# Patient Record
Sex: Female | Born: 1955
Health system: Southern US, Community
[De-identification: ages and names within clinical notes are randomized; demographics above are authoritative.]

## PROBLEM LIST (undated history)

## (undated) DIAGNOSIS — Z972 Presence of dental prosthetic device (complete) (partial): Secondary | ICD-10-CM

## (undated) DIAGNOSIS — Q760 Spina bifida occulta: Secondary | ICD-10-CM

## (undated) DIAGNOSIS — I639 Cerebral infarction, unspecified: Secondary | ICD-10-CM

## (undated) DIAGNOSIS — J45909 Unspecified asthma, uncomplicated: Secondary | ICD-10-CM

## (undated) DIAGNOSIS — I10 Essential (primary) hypertension: Secondary | ICD-10-CM

## (undated) DIAGNOSIS — E785 Hyperlipidemia, unspecified: Secondary | ICD-10-CM

## (undated) DIAGNOSIS — T7840XA Allergy, unspecified, initial encounter: Secondary | ICD-10-CM

## (undated) HISTORY — DX: Essential (primary) hypertension: I10

## (undated) HISTORY — DX: Allergy, unspecified, initial encounter: T78.40XA

## (undated) HISTORY — DX: Cerebral infarction, unspecified: I63.9

## (undated) HISTORY — DX: Unspecified asthma, uncomplicated: J45.909

## (undated) HISTORY — DX: Hyperlipidemia, unspecified: E78.5

---

## 1990-09-01 HISTORY — PX: TOTAL ABDOMINAL HYSTERECTOMY: SHX209

## 1999-09-02 HISTORY — PX: CORONARY ANGIOPLASTY WITH STENT PLACEMENT: SHX49

## 2004-09-01 HISTORY — PX: COLONOSCOPY: SHX5424

## 2004-09-01 HISTORY — PX: CARDIAC ELECTROPHYSIOLOGY STUDY AND ABLATION: SHX1294

## 2009-09-27 ENCOUNTER — Ambulatory Visit: Payer: Self-pay | Admitting: Family Medicine

## 2009-12-21 ENCOUNTER — Ambulatory Visit: Payer: Self-pay | Admitting: Family Medicine

## 2009-12-21 ENCOUNTER — Emergency Department: Payer: Self-pay | Admitting: Internal Medicine

## 2009-12-31 ENCOUNTER — Ambulatory Visit: Payer: Self-pay | Admitting: Family Medicine

## 2010-02-08 ENCOUNTER — Ambulatory Visit: Payer: Self-pay | Admitting: Family Medicine

## 2011-06-08 ENCOUNTER — Emergency Department: Payer: Self-pay | Admitting: Emergency Medicine

## 2011-06-12 DIAGNOSIS — E782 Mixed hyperlipidemia: Secondary | ICD-10-CM | POA: Insufficient documentation

## 2011-06-12 DIAGNOSIS — K219 Gastro-esophageal reflux disease without esophagitis: Secondary | ICD-10-CM | POA: Insufficient documentation

## 2011-09-28 ENCOUNTER — Ambulatory Visit: Payer: Self-pay | Admitting: Internal Medicine

## 2011-09-28 LAB — URINALYSIS, COMPLETE
Bilirubin,UR: NEGATIVE
Glucose,UR: NEGATIVE mg/dL (ref 0–75)
Ketone: NEGATIVE
Leukocyte Esterase: NEGATIVE
Nitrite: NEGATIVE
Ph: 6.5 (ref 4.5–8.0)
Protein: NEGATIVE
Specific Gravity: 1.015 (ref 1.003–1.030)

## 2011-09-29 LAB — URINE CULTURE

## 2015-03-18 DIAGNOSIS — G4733 Obstructive sleep apnea (adult) (pediatric): Secondary | ICD-10-CM | POA: Insufficient documentation

## 2015-05-24 DIAGNOSIS — I2510 Atherosclerotic heart disease of native coronary artery without angina pectoris: Secondary | ICD-10-CM | POA: Insufficient documentation

## 2015-08-13 LAB — HM MAMMOGRAPHY

## 2015-09-02 HISTORY — PX: ESOPHAGOGASTRODUODENOSCOPY: SHX1529

## 2015-10-25 DIAGNOSIS — Z8619 Personal history of other infectious and parasitic diseases: Secondary | ICD-10-CM | POA: Insufficient documentation

## 2015-10-25 DIAGNOSIS — F17201 Nicotine dependence, unspecified, in remission: Secondary | ICD-10-CM | POA: Insufficient documentation

## 2015-10-25 DIAGNOSIS — F17200 Nicotine dependence, unspecified, uncomplicated: Secondary | ICD-10-CM | POA: Insufficient documentation

## 2015-11-07 HISTORY — PX: LAPAROSCOPIC GASTRIC SLEEVE RESECTION: SHX5895

## 2018-11-10 LAB — CBC AND DIFFERENTIAL
Hemoglobin: 14.3 (ref 12.0–16.0)
Platelets: 196 (ref 150–399)
WBC: 9.5

## 2018-11-10 LAB — BASIC METABOLIC PANEL
BUN: 18 (ref 4–21)
Creatinine: 0.9 (ref 0.5–1.1)
GLUCOSE: 118
Potassium: 3.6 (ref 3.4–5.3)
Sodium: 144 (ref 137–147)

## 2018-11-10 LAB — HEPATIC FUNCTION PANEL
ALT: 10 (ref 7–35)
AST: 20 (ref 13–35)
Alkaline Phosphatase: 67 (ref 25–125)

## 2018-11-24 ENCOUNTER — Other Ambulatory Visit: Payer: Self-pay | Admitting: Internal Medicine

## 2018-11-24 ENCOUNTER — Encounter: Payer: Self-pay | Admitting: Internal Medicine

## 2018-11-24 DIAGNOSIS — I471 Supraventricular tachycardia, unspecified: Secondary | ICD-10-CM | POA: Insufficient documentation

## 2018-11-24 DIAGNOSIS — R7303 Prediabetes: Secondary | ICD-10-CM | POA: Insufficient documentation

## 2018-11-24 DIAGNOSIS — Z87891 Personal history of nicotine dependence: Secondary | ICD-10-CM

## 2018-11-24 DIAGNOSIS — I10 Essential (primary) hypertension: Secondary | ICD-10-CM | POA: Insufficient documentation

## 2018-11-24 DIAGNOSIS — Z8673 Personal history of transient ischemic attack (TIA), and cerebral infarction without residual deficits: Secondary | ICD-10-CM | POA: Insufficient documentation

## 2018-11-24 HISTORY — DX: Supraventricular tachycardia, unspecified: I47.10

## 2018-11-24 HISTORY — DX: Supraventricular tachycardia: I47.1

## 2018-11-24 HISTORY — DX: Prediabetes: R73.03

## 2018-11-25 ENCOUNTER — Encounter: Payer: Self-pay | Admitting: Internal Medicine

## 2018-11-25 ENCOUNTER — Other Ambulatory Visit: Payer: Self-pay

## 2018-11-25 ENCOUNTER — Ambulatory Visit (INDEPENDENT_AMBULATORY_CARE_PROVIDER_SITE_OTHER): Payer: 59 | Admitting: Internal Medicine

## 2018-11-25 VITALS — BP 144/88 | HR 73 | Ht 61.25 in | Wt 125.6 lb

## 2018-11-25 DIAGNOSIS — N904 Leukoplakia of vulva: Secondary | ICD-10-CM | POA: Diagnosis not present

## 2018-11-25 DIAGNOSIS — I10 Essential (primary) hypertension: Secondary | ICD-10-CM

## 2018-11-25 DIAGNOSIS — Z23 Encounter for immunization: Secondary | ICD-10-CM

## 2018-11-25 DIAGNOSIS — Z1211 Encounter for screening for malignant neoplasm of colon: Secondary | ICD-10-CM

## 2018-11-25 DIAGNOSIS — E782 Mixed hyperlipidemia: Secondary | ICD-10-CM

## 2018-11-25 DIAGNOSIS — Z8673 Personal history of transient ischemic attack (TIA), and cerebral infarction without residual deficits: Secondary | ICD-10-CM

## 2018-11-25 MED ORDER — CLOBETASOL PROPIONATE 0.025 % EX CREA: 1.0000 "application " | TOPICAL_CREAM | Freq: Two times a day (BID) | CUTANEOUS | 2 refills | Status: DC

## 2018-11-25 MED ORDER — LISINOPRIL-HYDROCHLOROTHIAZIDE 20-25 MG PO TABS: 1.0000 | ORAL_TABLET | Freq: Every day | ORAL | 1 refills | Status: DC

## 2018-11-25 NOTE — Progress Notes (Signed)
Date:  11/25/2018   Name:  Tiffany Bonilla   DOB:  April 29, 1956   MRN:  [id number]   Chief Complaint: Establish Care  Hypertension  This is a chronic problem. The problem has been gradually worsening since onset. The problem is uncontrolled. Pertinent negatives include no blurred vision, chest pain, headaches, orthopnea, palpitations, peripheral edema or shortness of breath.  Hyperlipidemia  This is a recurrent problem. Pertinent negatives include no chest pain, myalgias or shortness of breath. Current antihyperlipidemic treatment includes statins.  Old CVA - seen on CT at [facility] 2 weeks ago when she presented with elevated blood pressure.  She was started on lisinopril and atorvastatin at that time.  She is also taking aspirin 81 mg. There is no midline shift or mass. No evidence of intracranial hemorrhage. Small hypodense lesion in right putamen (2:14). Arterial calcifications in bilateral intradural vertebral arteries and carotid siphons.  OSA -  Previously on CPAP but stopped it after weight loss surgery and she was re-tested with no evidence of apnea.  Tobacco use - she had quit for her gastric sleeve surgery but restarted smoking several years ago.  She is smoking about 1/2 ppd day.  Lichen sclerosis - she was diagnosed with this several years ago by Dermatology and bx.  She was treated with steroid cream and it mostly resolved.  However, it has flared up again and she does not recall the name of the dermatologist.  Lab Results  Component Value Date   CREATININE 0.9 11/10/2018   BUN 18 11/10/2018   NA 144 11/10/2018   K 3.6 11/10/2018   Lab Results  Component Value Date   WBC 9.5 11/10/2018   HGB 14.3 11/10/2018   PLT 196 11/10/2018    Review of Systems  Constitutional: Negative for chills, fatigue, fever and unexpected weight change.  HENT: Negative for postnasal drip and sinus pressure.   Eyes: Negative for blurred vision and visual disturbance.  Respiratory: Negative  for cough, chest tightness, shortness of breath and wheezing.   Cardiovascular: Negative for chest pain, palpitations and orthopnea.  Gastrointestinal: Negative for abdominal pain, constipation and diarrhea.  Genitourinary: Negative for dysuria.  Musculoskeletal: Negative for arthralgias, back pain and myalgias.  Allergic/Immunologic: Positive for environmental allergies.  Neurological: Negative for dizziness and headaches.  Psychiatric/Behavioral: Negative for dysphoric mood and sleep disturbance. The patient is not nervous/anxious.     Patient Active Problem List   Diagnosis Date Noted  . Essential hypertension 11/24/2018  . Pre-diabetes 11/24/2018  . SVT (supraventricular tachycardia) ([place]) 11/24/2018  . Old lacunar stroke without late effect 11/24/2018  . Former smoker 10/25/2015  . Hx of hepatitis C 10/25/2015  . CAD in native artery 05/24/2015  . Obstructive sleep apnea 03/18/2015  . GERD (gastroesophageal reflux disease) 06/12/2011  . Mixed hyperlipidemia 06/12/2011    Allergies  Allergen Reactions  . Gabapentin Other (See Comments)    Unconscious   . Morphine Hives and Shortness Of Breath    Stopped breathing   . Scopolamine Other (See Comments)    Unconscious   . Codeine Nausea Only and Other (See Comments)    NAUSEA/VOMITING   . Sulfa Antibiotics Hives and Nausea Only    Other Reaction: Not Assessed     Past Surgical History:  Procedure Laterality Date  . CARDIAC ELECTROPHYSIOLOGY STUDY AND ABLATION  2006   for SVT  . COLONOSCOPY  2006  . CORONARY ANGIOPLASTY WITH STENT PLACEMENT  2001   to Circumfx  . ESOPHAGOGASTRODUODENOSCOPY  2017  . LAPAROSCOPIC GASTRIC SLEEVE RESECTION  11/07/2015  . TOTAL ABDOMINAL HYSTERECTOMY  1992   CIS cervix    Social History   Tobacco Use  . Smoking status: Current Every Day Smoker    Packs/day: 0.00    Years: 32.00    Pack years: 0.00    Types: Cigarettes  . Smokeless tobacco: Never Used  Substance Use Topics  .  Alcohol use: Yes    Comment: rare  . Drug use: Never    Medication list has been reviewed and updated.  Current Meds  Medication Sig  . aspirin EC 81 MG tablet Take 1 tablet by mouth daily.  Marland Kitchen atorvastatin (LIPITOR) 80 MG tablet Take 80 mg by mouth daily at 6 PM.   . lisinopril-hydrochlorothiazide (PRINZIDE,ZESTORETIC) 10-12.5 MG tablet Take 1 tablet by mouth daily.   . Multiple Vitamins-Minerals (ALIVE WOMENS ENERGY) TABS Take by mouth.    PHQ 2/9 Scores 11/25/2018  PHQ - 2 Score 0    Physical Exam Vitals signs and nursing note reviewed.  Constitutional:      General: She is not in acute distress.    Appearance: She is well-developed.  HENT:     Head: Normocephalic and atraumatic.     Nose: Nose normal.  Neck:     Musculoskeletal: Normal range of motion and neck supple.  Cardiovascular:     Rate and Rhythm: Normal rate and regular rhythm.  No extrasystoles are present.    Pulses: Normal pulses.     Heart sounds: Normal heart sounds.  Pulmonary:     Effort: Pulmonary effort is normal. No respiratory distress.     Breath sounds: Normal breath sounds.  Musculoskeletal: Normal range of motion.  Lymphadenopathy:     Cervical: No cervical adenopathy.  Skin:    General: Skin is warm and dry.     Findings: No rash.     Comments: Genital exam not performed per pt request.  Neurological:     Mental Status: She is alert and oriented to person, place, and time.  Psychiatric:        Attention and Perception: Attention normal.        Mood and Affect: Mood normal.        Behavior: Behavior normal.        Thought Content: Thought content normal.     Wt Readings from Last 3 Encounters:  11/25/18 125 lb 9.6 oz (57 kg)    BP (!) 144/88   Pulse 73   Ht 5' 1.25" (1.556 m)   Wt 125 lb 9.6 oz (57 kg)   SpO2 97%   BMI 23.54 kg/m   Assessment and Plan: 1. Lichen sclerosus et atrophicus of the vulva If no improvement, will need to see Dermatology again - Clobetasol  Propionate 0.025 % CREA; Apply 1 application topically 2 (two) times daily.  Dispense: 60 g; Refill: 2  2. Essential hypertension Increase dose for better control Continue to monitor at home - lisinopril-hydrochlorothiazide (PRINZIDE,ZESTORETIC) 20-25 MG tablet; Take 1 tablet by mouth daily.  Dispense: 90 tablet; Refill: 1  3. Old lacunar stroke without late effect Case discussed Need to reduce risk factors, including smoking Continue aspirin, statin  4. Mixed hyperlipidemia On statin therapy Check labs next visit  5. Need for vaccination for pneumococcus - Pneumococcal polysaccharide vaccine 23-valent greater than or equal to 2yo subcutaneous/IM  6. Colon cancer screening Pt declines colonoscopy - Fecal occult blood, imunochemical   Partially dictated using Editor, commissioning. Any errors  are unintentional.  Halina Maidens, MD [facility]  11/25/2018

## 2018-11-25 NOTE — Patient Instructions (Signed)

## 2018-12-08 ENCOUNTER — Ambulatory Visit: Payer: Self-pay | Admitting: Internal Medicine

## 2018-12-30 ENCOUNTER — Ambulatory Visit: Payer: 59 | Admitting: Internal Medicine

## 2019-02-16 ENCOUNTER — Ambulatory Visit (INDEPENDENT_AMBULATORY_CARE_PROVIDER_SITE_OTHER): Payer: 59 | Admitting: Internal Medicine

## 2019-02-16 ENCOUNTER — Other Ambulatory Visit: Payer: Self-pay

## 2019-02-16 ENCOUNTER — Encounter: Payer: Self-pay | Admitting: Internal Medicine

## 2019-02-16 VITALS — BP 128/82 | HR 84 | Ht 61.25 in | Wt 127.0 lb

## 2019-02-16 DIAGNOSIS — Z0000 Encounter for general adult medical examination without abnormal findings: Secondary | ICD-10-CM | POA: Diagnosis not present

## 2019-02-16 DIAGNOSIS — E782 Mixed hyperlipidemia: Secondary | ICD-10-CM | POA: Diagnosis not present

## 2019-02-16 DIAGNOSIS — Z1211 Encounter for screening for malignant neoplasm of colon: Secondary | ICD-10-CM | POA: Diagnosis not present

## 2019-02-16 DIAGNOSIS — F411 Generalized anxiety disorder: Secondary | ICD-10-CM | POA: Insufficient documentation

## 2019-02-16 DIAGNOSIS — F418 Other specified anxiety disorders: Secondary | ICD-10-CM

## 2019-02-16 DIAGNOSIS — Z1231 Encounter for screening mammogram for malignant neoplasm of breast: Secondary | ICD-10-CM | POA: Diagnosis not present

## 2019-02-16 DIAGNOSIS — I10 Essential (primary) hypertension: Secondary | ICD-10-CM | POA: Diagnosis not present

## 2019-02-16 DIAGNOSIS — R7303 Prediabetes: Secondary | ICD-10-CM | POA: Diagnosis not present

## 2019-02-16 DIAGNOSIS — H00014 Hordeolum externum left upper eyelid: Secondary | ICD-10-CM

## 2019-02-16 LAB — POCT URINALYSIS DIPSTICK
Bilirubin, UA: NEGATIVE
Blood, UA: NEGATIVE
Glucose, UA: NEGATIVE
Ketones, UA: NEGATIVE
Leukocytes, UA: NEGATIVE
Nitrite, UA: NEGATIVE
Protein, UA: POSITIVE — AB
Spec Grav, UA: 1.015 (ref 1.010–1.025)
Urobilinogen, UA: 0.2 E.U./dL
pH, UA: 6 (ref 5.0–8.0)

## 2019-02-16 MED ORDER — ATORVASTATIN CALCIUM 80 MG PO TABS: 80.0000 mg | ORAL_TABLET | Freq: Every day | ORAL | 1 refills | Status: DC

## 2019-02-16 MED ORDER — ESCITALOPRAM OXALATE 10 MG PO TABS: 10.0000 mg | ORAL_TABLET | Freq: Every day | ORAL | 1 refills | Status: DC

## 2019-02-16 MED ORDER — ERYTHROMYCIN 5 MG/GM OP OINT: 1.0000 "application " | TOPICAL_OINTMENT | Freq: Every day | OPHTHALMIC | 0 refills | Status: DC

## 2019-02-16 NOTE — Progress Notes (Signed)
Date:  02/16/2019   Name:  Tiffany Bonilla   DOB:  1955-10-25   MRN:  [id number]   Chief Complaint: Annual Exam (Breast Exam and no pap- hysterectomy ) and Anxiety (12- GAD7 ... 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 Afraid of COVID. ) Tiffany Bonilla is a 63 y.o. female who presents today for her Complete Annual Exam. She feels fairly well. She reports exercising some. She reports she is sleeping well. She denies breast issues.  She is having more anxiety due to Covid-19.   Mammogram - 08/2015 @ [facility] - no record. FIT test given last visit - not yet completed Pap not needed Tdap 2012 PPV-23 2016  Hypertension This is a chronic problem. The problem is uncontrolled (medication dose changed last visit). Associated symptoms include anxiety. Pertinent negatives include no chest pain, headaches, palpitations or shortness of breath. Past treatments include ACE inhibitors and diuretics. The current treatment provides significant improvement. There are no compliance problems.   Hyperlipidemia The problem is controlled. Pertinent negatives include no chest pain or shortness of breath. Current antihyperlipidemic treatment includes statins.  Diabetes She presents for her follow-up diabetic visit. Diabetes type: prediabetes. Hypoglycemia symptoms include nervousness/anxiousness. Pertinent negatives for hypoglycemia include no dizziness, headaches or tremors. Pertinent negatives for diabetes include no chest pain, no fatigue, no polydipsia and no polyuria.  Anxiety Presents for initial visit. The problem has been gradually worsening (to covid and working as a Multimedia programmer in nursing homes). Symptoms include nervous/anxious behavior. Patient reports no chest pain, dizziness, palpitations, shortness of breath or suicidal ideas. The symptoms are aggravated by work stress.    Eye Problem  The left eye is affected. This is a new problem. The current episode  started in the past 7 days. The problem occurs constantly. The pain is mild. Associated symptoms include eye redness (stye of left eye). Pertinent negatives include no fever or vomiting. She has tried eye drops for the symptoms. The treatment provided mild relief.    Review of Systems  Constitutional: Negative for chills, fatigue and fever.  HENT: Negative for congestion, hearing loss, tinnitus, trouble swallowing and voice change.   Eyes: Positive for redness (stye of left eye). Negative for visual disturbance.  Respiratory: Negative for cough, chest tightness, shortness of breath and wheezing.   Cardiovascular: Negative for chest pain, palpitations and leg swelling.  Gastrointestinal: Positive for abdominal pain (intermittent attacks of gall bladder sx with pain and vomiting). Negative for constipation, diarrhea and vomiting.  Endocrine: Negative for polydipsia and polyuria.  Genitourinary: Negative for dysuria, frequency, genital sores, vaginal bleeding and vaginal discharge.  Musculoskeletal: Negative for arthralgias, gait problem and joint swelling.  Skin: Negative for color change and rash.  Allergic/Immunologic: Negative for environmental allergies.  Neurological: Negative for dizziness, tremors, light-headedness and headaches.  Hematological: Negative for adenopathy. Does not bruise/bleed easily.  Psychiatric/Behavioral: Negative for dysphoric mood, sleep disturbance and suicidal ideas. The patient is nervous/anxious.     Patient Active Problem List   Diagnosis Date Noted   Lichen sclerosus et atrophicus of the vulva 11/25/2018   Essential hypertension 11/24/2018   Pre-diabetes 11/24/2018   SVT (supraventricular tachycardia) ([place]) 11/24/2018   Old lacunar stroke without late effect 11/24/2018   Current every day smoker 10/25/2015   Hx of hepatitis C 10/25/2015   CAD in native artery 05/24/2015   Obstructive sleep apnea 03/18/2015   GERD (gastroesophageal reflux  disease) 06/12/2011   Mixed hyperlipidemia  06/12/2011    Allergies  Allergen Reactions   Gabapentin Other (See Comments)    Unconscious    Morphine Hives and Shortness Of Breath    Stopped breathing    Scopolamine Other (See Comments)    Unconscious    Codeine Nausea Only and Other (See Comments)    NAUSEA/VOMITING    Sulfa Antibiotics Hives and Nausea Only    Other Reaction: Not Assessed     Past Surgical History:  Procedure Laterality Date   CARDIAC ELECTROPHYSIOLOGY STUDY AND ABLATION  2006   for SVT   COLONOSCOPY  2006   CORONARY ANGIOPLASTY WITH STENT PLACEMENT  2001   to Circumfx   ESOPHAGOGASTRODUODENOSCOPY  2017   LAPAROSCOPIC GASTRIC SLEEVE RESECTION  11/07/2015   TOTAL ABDOMINAL HYSTERECTOMY  1992   CIS cervix    Social History   Tobacco Use   Smoking status: Current Every Day Smoker    Packs/day: 0.00    Years: 32.00    Pack years: 0.00    Types: Cigarettes   Smokeless tobacco: Never Used  Substance Use Topics   Alcohol use: Yes    Comment: rare   Drug use: Never     Medication list has been reviewed and updated.  Current Meds  Medication Sig   aspirin EC 81 MG tablet Take 1 tablet by mouth daily.   atorvastatin (LIPITOR) 80 MG tablet Take 80 mg by mouth daily at 6 PM.    Black Elderberry 50 MG/5ML SYRP Take by mouth.   Clobetasol Propionate 0.025 % CREA Apply 1 application topically 2 (two) times daily.   lisinopril-hydrochlorothiazide (PRINZIDE,ZESTORETIC) 20-25 MG tablet Take 1 tablet by mouth daily.   Multiple Vitamins-Minerals (ALIVE WOMENS ENERGY) TABS Take by mouth.    PHQ 2/9 Scores 02/16/2019 11/25/2018  PHQ - 2 Score 0 0  PHQ- 9 Score 5 -   GAD 7 : Generalized Anxiety Score 02/16/2019  Nervous, Anxious, on Edge 3  Control/stop worrying 1  Worry too much - different things 1  Trouble relaxing 1  Restless 1  Easily annoyed or irritable 2  Afraid - awful might happen 3  Total GAD 7 Score 12  Anxiety  Difficulty Very difficult      BP Readings from Last 3 Encounters:  02/16/19 128/82  11/25/18 (!) 144/88    Physical Exam Vitals signs and nursing note reviewed.  Constitutional:      General: She is not in acute distress.    Appearance: She is well-developed.  HENT:     Head: Normocephalic and atraumatic.     Right Ear: Tympanic membrane and ear canal normal.     Left Ear: Tympanic membrane and ear canal normal.     Nose:     Right Sinus: No maxillary sinus tenderness.     Left Sinus: No maxillary sinus tenderness.     Mouth/Throat:     Pharynx: Uvula midline.  Eyes:     General: No scleral icterus.       Right eye: No discharge.        Left eye: No discharge.     Extraocular Movements: Extraocular movements intact.     Conjunctiva/sclera: Conjunctivae normal.   Neck:     Musculoskeletal: Normal range of motion. No erythema.     Thyroid: No thyromegaly.     Vascular: No carotid bruit.  Cardiovascular:     Rate and Rhythm: Normal rate and regular rhythm.  No extrasystoles are present.    Pulses: Normal pulses.  Heart sounds: Normal heart sounds.  Pulmonary:     Effort: Pulmonary effort is normal. No respiratory distress.     Breath sounds: No wheezing.  Chest:     Breasts:        Right: No mass, nipple discharge, skin change or tenderness.        Left: No mass, nipple discharge, skin change or tenderness.  Abdominal:     General: Bowel sounds are normal.     Palpations: Abdomen is soft.     Tenderness: There is no abdominal tenderness.  Musculoskeletal: Normal range of motion.     Right knee: Normal.     Left knee: Normal.     Right lower leg: No edema.     Left lower leg: No edema.  Lymphadenopathy:     Cervical: No cervical adenopathy.  Skin:    General: Skin is warm and dry.     Findings: No rash.  Neurological:     Mental Status: She is alert and oriented to person, place, and time.     Cranial Nerves: No cranial nerve deficit.     Sensory: No  sensory deficit.     Deep Tendon Reflexes: Reflexes are normal and symmetric.  Psychiatric:        Attention and Perception: Attention normal.        Mood and Affect: Mood normal.        Speech: Speech normal.        Behavior: Behavior normal.        Thought Content: Thought content normal. Thought content does not include suicidal plan.        Cognition and Memory: Cognition normal.     Wt Readings from Last 3 Encounters:  02/16/19 127 lb (57.6 kg)  11/25/18 125 lb 9.6 oz (57 kg)    BP 128/82    Pulse 84    Ht 5' 1.25" (1.556 m)    Wt 127 lb (57.6 kg)    SpO2 97%    BMI 23.80 kg/m   Assessment and Plan: 1. Annual physical exam Normal exam - POCT urinalysis dipstick  2. Encounter for screening mammogram for breast cancer Schedule at [facility]; Future  3. Colon cancer screening Pt declines colonoscopy - Fecal occult blood, imunochemical  4. Essential hypertension controlled - CBC with Differential/Platelet - TSH  5. Mixed hyperlipidemia Resume lipitor (had run out of meds and did not call to refill) - Lipid panel - atorvastatin (LIPITOR) 80 MG tablet; Take 1 tablet (80 mg total) by mouth daily at 6 PM.  Dispense: 90 tablet; Refill: 1  6. Pre-diabetes Continue diet and weight control - Comprehensive metabolic panel - Hemoglobin A1c  7. Hordeolum externum of left upper eyelid - erythromycin ophthalmic ointment; Place 1 application into the left eye at bedtime.  Dispense: 3.5 g; Refill: 0  8. Other specified anxiety disorders Begin lexapro Call if no improvement - escitalopram (LEXAPRO) 10 MG tablet; Take 1 tablet (10 mg total) by mouth daily.  Dispense: 90 tablet; Refill: 1   Partially dictated using Editor, commissioning. Any errors are unintentional.  Halina Maidens, MD [facility]  02/16/2019

## 2019-02-17 LAB — CBC WITH DIFFERENTIAL/PLATELET
Basophils Absolute: 0.2 10*3/uL (ref 0.0–0.2)
Basos: 1 %
EOS (ABSOLUTE): 0.6 10*3/uL — ABNORMAL HIGH (ref 0.0–0.4)
Eos: 6 %
Hematocrit: 51 % — ABNORMAL HIGH (ref 34.0–46.6)
Hemoglobin: 16.9 g/dL — ABNORMAL HIGH (ref 11.1–15.9)
Immature Grans (Abs): 0 10*3/uL (ref 0.0–0.1)
Immature Granulocytes: 0 %
Lymphocytes Absolute: 2.7 10*3/uL (ref 0.7–3.1)
Lymphs: 24 %
MCH: 31.1 pg (ref 26.6–33.0)
MCHC: 33.1 g/dL (ref 31.5–35.7)
MCV: 94 fL (ref 79–97)
Monocytes Absolute: 0.9 10*3/uL (ref 0.1–0.9)
Monocytes: 8 %
Neutrophils Absolute: 6.8 10*3/uL (ref 1.4–7.0)
Neutrophils: 61 %
Platelets: 286 10*3/uL (ref 150–450)
RBC: 5.43 x10E6/uL — ABNORMAL HIGH (ref 3.77–5.28)
RDW: 13.9 % (ref 11.7–15.4)
WBC: 11.2 10*3/uL — ABNORMAL HIGH (ref 3.4–10.8)

## 2019-02-17 LAB — HEMOGLOBIN A1C
Est. average glucose Bld gHb Est-mCnc: 111 mg/dL
Hgb A1c MFr Bld: 5.5 % (ref 4.8–5.6)

## 2019-02-17 LAB — TSH: TSH: 1.83 u[IU]/mL (ref 0.450–4.500)

## 2019-02-17 LAB — COMPREHENSIVE METABOLIC PANEL
ALT: 14 IU/L (ref 0–32)
AST: 20 IU/L (ref 0–40)
Albumin/Globulin Ratio: 1.7 (ref 1.2–2.2)
Albumin: 4.6 g/dL (ref 3.8–4.8)
Alkaline Phosphatase: 53 IU/L (ref 39–117)
BUN/Creatinine Ratio: 14 (ref 12–28)
BUN: 15 mg/dL (ref 8–27)
Bilirubin Total: 0.4 mg/dL (ref 0.0–1.2)
CO2: 26 mmol/L (ref 20–29)
Calcium: 9.7 mg/dL (ref 8.7–10.3)
Chloride: 99 mmol/L (ref 96–106)
Creatinine, Ser: 1.06 mg/dL — ABNORMAL HIGH (ref 0.57–1.00)
GFR calc Af Amer: 64 mL/min/{1.73_m2} (ref >59)
GFR calc non Af Amer: 56 mL/min/{1.73_m2} — ABNORMAL LOW (ref >59)
Globulin, Total: 2.7 g/dL (ref 1.5–4.5)
Glucose: 93 mg/dL (ref 65–99)
Potassium: 3.8 mmol/L (ref 3.5–5.2)
Sodium: 141 mmol/L (ref 134–144)
Total Protein: 7.3 g/dL (ref 6.0–8.5)

## 2019-02-17 LAB — LIPID PANEL
Chol/HDL Ratio: 4.1 ratio (ref 0.0–4.4)
Cholesterol, Total: 243 mg/dL — ABNORMAL HIGH (ref 100–199)
HDL: 60 mg/dL (ref >39)
LDL Calculated: 155 mg/dL — ABNORMAL HIGH (ref 0–99)
Triglycerides: 141 mg/dL (ref 0–149)
VLDL Cholesterol Cal: 28 mg/dL (ref 5–40)

## 2019-02-22 ENCOUNTER — Other Ambulatory Visit: Payer: Self-pay | Admitting: Internal Medicine

## 2019-02-22 DIAGNOSIS — I10 Essential (primary) hypertension: Secondary | ICD-10-CM

## 2019-02-22 DIAGNOSIS — E782 Mixed hyperlipidemia: Secondary | ICD-10-CM

## 2019-02-22 MED ORDER — ROSUVASTATIN CALCIUM 40 MG PO TABS: 40.0000 mg | ORAL_TABLET | Freq: Every day | ORAL | 3 refills | Status: DC

## 2019-02-22 NOTE — Progress Notes (Signed)
Spoke with pt about labs. Informed of RBC and cholesterol elevation. She would like to change cholesterol medication to Crestor.  Needs sent in. Told her to keep her 6 month follow up and we will need her fasting to recheck labs then.

## 2019-04-19 LAB — FECAL OCCULT BLOOD, IMMUNOCHEMICAL

## 2019-04-19 LAB — SPECIMEN STATUS REPORT

## 2019-04-20 ENCOUNTER — Telehealth: Payer: Self-pay

## 2019-04-20 NOTE — Telephone Encounter (Signed)
Patient called saying she discussed at her CPE about having gall bladder issues. She has had 3 episodes since her visit in our office and one was today. Was told to call if she wanted a gallbladder US. She would now like this ordered.   Please advise?  CB# [phone]

## 2019-04-20 NOTE — Telephone Encounter (Signed)
Pt having severe pain today so she is going to the ED now.

## 2019-04-25 HISTORY — PX: LAPAROSCOPIC CHOLECYSTECTOMY: SUR755

## 2019-05-28 ENCOUNTER — Other Ambulatory Visit: Payer: Self-pay | Admitting: Internal Medicine

## 2019-05-28 DIAGNOSIS — I10 Essential (primary) hypertension: Secondary | ICD-10-CM

## 2019-08-18 ENCOUNTER — Ambulatory Visit: Payer: 59 | Admitting: Internal Medicine

## 2019-09-04 ENCOUNTER — Encounter: Payer: Self-pay | Admitting: Internal Medicine

## 2019-09-05 ENCOUNTER — Other Ambulatory Visit: Payer: Self-pay

## 2019-09-05 ENCOUNTER — Encounter: Payer: Self-pay | Admitting: Internal Medicine

## 2019-09-05 ENCOUNTER — Ambulatory Visit (INDEPENDENT_AMBULATORY_CARE_PROVIDER_SITE_OTHER): Payer: Medicare HMO | Admitting: Internal Medicine

## 2019-09-05 VITALS — BP 126/62 | HR 67 | Ht 61.25 in | Wt 127.0 lb

## 2019-09-05 DIAGNOSIS — F418 Other specified anxiety disorders: Secondary | ICD-10-CM | POA: Diagnosis not present

## 2019-09-05 DIAGNOSIS — K219 Gastro-esophageal reflux disease without esophagitis: Secondary | ICD-10-CM | POA: Diagnosis not present

## 2019-09-05 DIAGNOSIS — I10 Essential (primary) hypertension: Secondary | ICD-10-CM

## 2019-09-05 DIAGNOSIS — E782 Mixed hyperlipidemia: Secondary | ICD-10-CM | POA: Diagnosis not present

## 2019-09-05 DIAGNOSIS — E559 Vitamin D deficiency, unspecified: Secondary | ICD-10-CM | POA: Diagnosis not present

## 2019-09-05 DIAGNOSIS — F17200 Nicotine dependence, unspecified, uncomplicated: Secondary | ICD-10-CM | POA: Diagnosis not present

## 2019-09-05 DIAGNOSIS — I2510 Atherosclerotic heart disease of native coronary artery without angina pectoris: Secondary | ICD-10-CM | POA: Diagnosis not present

## 2019-09-05 MED ORDER — ESCITALOPRAM OXALATE 10 MG PO TABS: 10.0000 mg | ORAL_TABLET | Freq: Every day | ORAL | 3 refills | Status: DC

## 2019-09-05 MED ORDER — LISINOPRIL-HYDROCHLOROTHIAZIDE 20-25 MG PO TABS: 1.0000 | ORAL_TABLET | Freq: Every day | ORAL | 3 refills | Status: DC

## 2019-09-05 MED ORDER — ROSUVASTATIN CALCIUM 40 MG PO TABS: 40.0000 mg | ORAL_TABLET | Freq: Every day | ORAL | 3 refills | Status: DC

## 2019-09-05 NOTE — Progress Notes (Signed)
Date:  09/05/2019   Name:  Tiffany Bonilla   DOB:  1956-01-10   MRN:  [id number]   Chief Complaint: Hypertension and Hyperlipidemia She underwent cholecystectomy for gall stones in August.   She has fully recovered. Hypertension This is a chronic problem. The problem is controlled. Pertinent negatives include no chest pain, headaches, palpitations or shortness of breath. Past treatments include diuretics and ACE inhibitors. The current treatment provides significant improvement. Hypertensive end-organ damage includes CAD/MI.  Hyperlipidemia This is a chronic problem. The problem is resistant. Pertinent negatives include no chest pain or shortness of breath. Current antihyperlipidemic treatment includes statins (changed from lipitor 80 to Crestor 40 several months ago but it never occured and she has remained on lipitor).    Lab Results  Component Value Date   CREATININE 1.06 (H) 02/16/2019   BUN 15 02/16/2019   NA 141 02/16/2019   K 3.8 02/16/2019   CL 99 02/16/2019   CO2 26 02/16/2019   Lab Results  Component Value Date   CHOL 243 (H) 02/16/2019   HDL 60 02/16/2019   LDLCALC 155 (H) 02/16/2019   TRIG 141 02/16/2019   CHOLHDL 4.1 02/16/2019   Lab Results  Component Value Date   TSH 1.830 02/16/2019   Lab Results  Component Value Date   HGBA1C 5.5 02/16/2019     Review of Systems  Constitutional: Negative for chills, fatigue, fever and unexpected weight change.  HENT: Negative for trouble swallowing.   Respiratory: Negative for cough, chest tightness, shortness of breath and wheezing.   Cardiovascular: Negative for chest pain, palpitations and leg swelling.  Musculoskeletal: Positive for arthralgias (right hip).  Neurological: Negative for dizziness, light-headedness and headaches.  Psychiatric/Behavioral: Negative for dysphoric mood and sleep disturbance. The patient is not nervous/anxious.     Patient Active Problem List   Diagnosis Date Noted  . Other  specified anxiety disorders 02/16/2019  . Lichen sclerosus et atrophicus of the vulva 11/25/2018  . Essential hypertension 11/24/2018  . SVT (supraventricular tachycardia) ([place]) 11/24/2018  . Old lacunar stroke without late effect 11/24/2018  . Current every day smoker 10/25/2015  . Hx of hepatitis C 10/25/2015  . CAD in native artery 05/24/2015  . Obstructive sleep apnea 03/18/2015  . GERD (gastroesophageal reflux disease) 06/12/2011  . Mixed hyperlipidemia 06/12/2011    Allergies  Allergen Reactions  . Gabapentin Other (See Comments)    Unconscious   . Morphine Hives and Shortness Of Breath    Stopped breathing   . Scopolamine Other (See Comments)    Unconscious   . Codeine Nausea Only and Other (See Comments)    NAUSEA/VOMITING   . Sulfa Antibiotics Hives and Nausea Only    Other Reaction: Not Assessed     Past Surgical History:  Procedure Laterality Date  . CARDIAC ELECTROPHYSIOLOGY STUDY AND ABLATION  2006   for SVT  . COLONOSCOPY  2006  . CORONARY ANGIOPLASTY WITH STENT PLACEMENT  2001   to Circumfx  . ESOPHAGOGASTRODUODENOSCOPY  2017  . LAPAROSCOPIC CHOLECYSTECTOMY  04/25/2019   @ [facility]  . LAPAROSCOPIC GASTRIC SLEEVE RESECTION  11/07/2015  . TOTAL ABDOMINAL HYSTERECTOMY  1992   CIS cervix    Social History   Tobacco Use  . Smoking status: Current Every Day Smoker    Packs/day: 0.50    Years: 32.00    Pack years: 16.00    Types: Cigarettes  . Smokeless tobacco: Never Used  Substance Use Topics  . Alcohol use: Yes  Comment: rare  . Drug use: Never     Medication list has been reviewed and updated.  Current Meds  Medication Sig  . aspirin EC 81 MG tablet Take 1 tablet by mouth daily.  . Black Elderberry 50 MG/5ML SYRP Take by mouth.  . Clobetasol Propionate 0.025 % CREA Apply 1 application topically 2 (two) times daily.  Marland Kitchen escitalopram (LEXAPRO) 10 MG tablet Take 1 tablet (10 mg total) by mouth daily.  Marland Kitchen lisinopril-hydrochlorothiazide  (ZESTORETIC) 20-25 MG tablet TAKE 1 TABLET BY MOUTH DAILY  . Multiple Vitamins-Minerals (ALIVE WOMENS ENERGY) TABS Take by mouth.  . rosuvastatin (CRESTOR) 40 MG tablet Take 1 tablet (40 mg total) by mouth daily.    PHQ 2/9 Scores 09/05/2019 09/05/2019 02/16/2019 11/25/2018  PHQ - 2 Score 0 0 0 0  PHQ- 9 Score 0 - 5 -    BP Readings from Last 3 Encounters:  09/05/19 126/62  02/16/19 128/82  11/25/18 (!) 144/88    Physical Exam Vitals and nursing note reviewed.  Constitutional:      General: She is not in acute distress.    Appearance: She is well-developed.  HENT:     Head: Normocephalic and atraumatic.  Cardiovascular:     Rate and Rhythm: Normal rate and regular rhythm.     Pulses: Normal pulses.  Pulmonary:     Effort: Pulmonary effort is normal. No respiratory distress.     Breath sounds: No wheezing or rhonchi.  Musculoskeletal:        General: Normal range of motion.     Cervical back: Normal range of motion and neck supple.     Right lower leg: No edema.     Left lower leg: No edema.  Skin:    General: Skin is warm and dry.     Capillary Refill: Capillary refill takes less than 2 seconds.     Findings: No rash.  Neurological:     General: No focal deficit present.     Mental Status: She is alert and oriented to person, place, and time.  Psychiatric:        Attention and Perception: Attention normal.        Mood and Affect: Mood normal.        Behavior: Behavior normal.        Thought Content: Thought content normal.     Wt Readings from Last 3 Encounters:  09/05/19 127 lb (57.6 kg)  02/16/19 127 lb (57.6 kg)  11/25/18 125 lb 9.6 oz (57 kg)    BP 126/62   Pulse 67   Ht 5' 1.25" (1.556 m)   Wt 127 lb (57.6 kg)   SpO2 97%   BMI 23.80 kg/m   Assessment and Plan: 1. Essential hypertension Clinically stable exam with well controlled BP.   Tolerating medications well without side effects at this time. Pt to continue current regimen and low sodium diet;  benefits of regular exercise as able discussed. - Comprehensive metabolic panel - lisinopril-hydrochlorothiazide (ZESTORETIC) 20-25 MG tablet; Take 1 tablet by mouth daily.  Dispense: 90 tablet; Refill: 3 - CBC with Differential/Platelet  2. Mixed hyperlipidemia Never changed to Crestor - likely a pharmacy issue Will resend today with a note to the pharmacy - Lipid panel - rosuvastatin (CRESTOR) 40 MG tablet; Take 1 tablet (40 mg total) by mouth daily.  Dispense: 90 tablet; Refill: 3  3. Current every day smoker Pt has resumed smoking - 1/2 ppd At risk for Osteoporosis due to smoking, low  body weight, etc - Vitamin D (25 hydroxy)  4. Other specified anxiety disorders Clinically doing well on Lexapro - no side effects, SI/HI - escitalopram (LEXAPRO) 10 MG tablet; Take 1 tablet (10 mg total) by mouth daily.  Dispense: 90 tablet; Refill: 3   Partially dictated using Editor, commissioning. Any errors are unintentional.  Halina Maidens, MD [facility]  09/05/2019

## 2019-09-06 ENCOUNTER — Encounter: Payer: Self-pay | Admitting: Internal Medicine

## 2019-09-06 DIAGNOSIS — E559 Vitamin D deficiency, unspecified: Secondary | ICD-10-CM | POA: Insufficient documentation

## 2019-09-06 LAB — CBC WITH DIFFERENTIAL/PLATELET
Basophils Absolute: 0.2 10*3/uL (ref 0.0–0.2)
Basos: 1 %
EOS (ABSOLUTE): 0.8 10*3/uL — ABNORMAL HIGH (ref 0.0–0.4)
Eos: 7 %
Hematocrit: 48.7 % — ABNORMAL HIGH (ref 34.0–46.6)
Hemoglobin: 16.7 g/dL — ABNORMAL HIGH (ref 11.1–15.9)
Immature Grans (Abs): 0 10*3/uL (ref 0.0–0.1)
Immature Granulocytes: 0 %
Lymphocytes Absolute: 3.1 10*3/uL (ref 0.7–3.1)
Lymphs: 26 %
MCH: 31.3 pg (ref 26.6–33.0)
MCHC: 34.3 g/dL (ref 31.5–35.7)
MCV: 91 fL (ref 79–97)
Monocytes Absolute: 1 10*3/uL — ABNORMAL HIGH (ref 0.1–0.9)
Monocytes: 8 %
Neutrophils Absolute: 6.8 10*3/uL (ref 1.4–7.0)
Neutrophils: 58 %
Platelets: 227 10*3/uL (ref 150–450)
RBC: 5.33 x10E6/uL — ABNORMAL HIGH (ref 3.77–5.28)
RDW: 12.6 % (ref 11.7–15.4)
WBC: 11.9 10*3/uL — ABNORMAL HIGH (ref 3.4–10.8)

## 2019-09-06 LAB — COMPREHENSIVE METABOLIC PANEL
ALT: 25 IU/L (ref 0–32)
AST: 26 IU/L (ref 0–40)
Albumin/Globulin Ratio: 1.8 (ref 1.2–2.2)
Albumin: 4.4 g/dL (ref 3.8–4.8)
Alkaline Phosphatase: 62 IU/L (ref 39–117)
BUN/Creatinine Ratio: 14 (ref 12–28)
BUN: 20 mg/dL (ref 8–27)
Bilirubin Total: 0.4 mg/dL (ref 0.0–1.2)
CO2: 25 mmol/L (ref 20–29)
Calcium: 9.6 mg/dL (ref 8.7–10.3)
Chloride: 99 mmol/L (ref 96–106)
Creatinine, Ser: 1.39 mg/dL — ABNORMAL HIGH (ref 0.57–1.00)
GFR calc Af Amer: 46 mL/min/{1.73_m2} — ABNORMAL LOW (ref >59)
GFR calc non Af Amer: 40 mL/min/{1.73_m2} — ABNORMAL LOW (ref >59)
Globulin, Total: 2.5 g/dL (ref 1.5–4.5)
Glucose: 90 mg/dL (ref 65–99)
Potassium: 3.6 mmol/L (ref 3.5–5.2)
Sodium: 140 mmol/L (ref 134–144)
Total Protein: 6.9 g/dL (ref 6.0–8.5)

## 2019-09-06 LAB — LIPID PANEL
Chol/HDL Ratio: 2.5 ratio (ref 0.0–4.4)
Cholesterol, Total: 142 mg/dL (ref 100–199)
HDL: 57 mg/dL (ref >39)
LDL Chol Calc (NIH): 67 mg/dL (ref 0–99)
Triglycerides: 95 mg/dL (ref 0–149)
VLDL Cholesterol Cal: 18 mg/dL (ref 5–40)

## 2019-09-06 LAB — VITAMIN D 25 HYDROXY (VIT D DEFICIENCY, FRACTURES): Vit D, 25-Hydroxy: 16.9 ng/mL — ABNORMAL LOW (ref 30.0–100.0)

## 2019-10-03 ENCOUNTER — Telehealth: Payer: Self-pay

## 2019-10-03 NOTE — Telephone Encounter (Signed)
Is she having any symptoms of lightheadedness, chest pain, syncope, worsening shortness of breath?   If so, she needs to be seen in the ER given her cardiac history and Covid diagnosis.  If she has a current Cardiologist - she could also call them for advice.

## 2019-10-03 NOTE — Telephone Encounter (Signed)
Pt called saying she tested POS for Covid last week and is now concerned about her heart rate. Its staying in the high 40s and this is not normal for her.   Should she seek care at the respiratory clinic?

## 2019-10-03 NOTE — Telephone Encounter (Signed)
Called and spoke with pt. She said she has a severe headache. No taste or smell. SpO2 is staying between 96-98%. Her BP is staying low at 98/60 and heart rate is staying in the 40s. She has no SOB, or chest pain. Obnly sometimes feels lightheaded when she stands.   Informed Dr. Army Melia. If pt does pass out or symptoms lightheadedness gets worse she will call 911. Otherwise, per Dr Army Melia, pt will stop lisinopril/hctz for the week and call back in 1 week with symptoms and BP readings for once daily.  She confirmed that she understood verbally.

## 2019-10-11 ENCOUNTER — Telehealth: Payer: Self-pay

## 2019-10-11 NOTE — Telephone Encounter (Signed)
Pt calling to follow up after stopping her BP meds for 1 week while having Covid due to low heart rate and BP.  Her BP for the last week has been staying steady around 142/78 and her heart rate is now back up at 82.  Should she start back on her BP meds?  Please advise and see previous telephone msg if needed.

## 2019-10-11 NOTE — Telephone Encounter (Signed)
If she feels that she has recovered from Covid, and is eating and drinking normally then she should resume her BP medication.  If she is still fatigued, etc it would be fine to hold it for another week.

## 2019-10-12 NOTE — Telephone Encounter (Signed)
Called and spoke with patient. Informed of this information. Pt is still fatigued and lightheaded on and off. She said she will wait and hold her BP medication for 1 more week before starting back on it regularly. Told her to call us back if she has any further issues with Bp or heart rate concerns.  CM

## 2019-12-02 ENCOUNTER — Other Ambulatory Visit: Payer: Self-pay | Admitting: Internal Medicine

## 2019-12-02 DIAGNOSIS — N904 Leukoplakia of vulva: Secondary | ICD-10-CM

## 2019-12-23 ENCOUNTER — Other Ambulatory Visit: Payer: Self-pay

## 2019-12-23 ENCOUNTER — Ambulatory Visit (INDEPENDENT_AMBULATORY_CARE_PROVIDER_SITE_OTHER): Payer: Medicare HMO | Admitting: Internal Medicine

## 2019-12-23 ENCOUNTER — Encounter: Payer: Self-pay | Admitting: Internal Medicine

## 2019-12-23 VITALS — BP 112/68 | HR 67 | Temp 97.5°F | Ht 61.25 in | Wt 128.0 lb

## 2019-12-23 DIAGNOSIS — Z1211 Encounter for screening for malignant neoplasm of colon: Secondary | ICD-10-CM

## 2019-12-23 DIAGNOSIS — N3000 Acute cystitis without hematuria: Secondary | ICD-10-CM | POA: Diagnosis not present

## 2019-12-23 DIAGNOSIS — R251 Tremor, unspecified: Secondary | ICD-10-CM | POA: Diagnosis not present

## 2019-12-23 MED ORDER — NITROFURANTOIN MONOHYD MACRO 100 MG PO CAPS: 100.0000 mg | ORAL_CAPSULE | Freq: Two times a day (BID) | ORAL | 0 refills | Status: AC

## 2019-12-23 NOTE — Progress Notes (Signed)
Date:  12/23/2019   Name:  Tiffany Bonilla   DOB:  03-10-1956   MRN:  [id number]   Chief Complaint: Urinary Tract Infection (Lower abd pain, and lower back pain-  Started 2 days ago. ) and Tremors (Worse int he morning than in the afternoon. Michela Pitcher this has been happening she tested POS for Covid at the end of January. )  Immunization History  Administered Date(s) Administered  . Influenza,inj,Quad PF,6+ Mos 07/13/2019  . Influenza-Unspecified 06/28/2013, 06/18/2015  . Moderna SARS-COVID-2 Vaccination 09/14/2019, 10/24/2019  . Pneumococcal Polysaccharide-23 06/29/2015, 11/25/2018  . Pneumococcal-Unspecified 01/13/2011  . Tdap 10/31/2010    Abdominal Pain This is a new problem. The current episode started yesterday. The problem occurs constantly. The pain is located in the suprapubic region. The pain is mild. The quality of the pain is colicky and cramping. The abdominal pain does not radiate. Associated symptoms include dysuria and frequency. Pertinent negatives include no constipation, diarrhea, fever or headaches. She has tried nothing for the symptoms.  Tremor - she has noticed tremor in her hands ever since she had Covid-19.  It is noticeable mostly when typing.  No weakness, tingling, does not drop items.  No tremor at rest.  Father had parkinson disease.  She has not had any tremor of her head or change in voice.  Lab Results  Component Value Date   CREATININE 1.39 (H) 09/05/2019   BUN 20 09/05/2019   NA 140 09/05/2019   K 3.6 09/05/2019   CL 99 09/05/2019   CO2 25 09/05/2019   Lab Results  Component Value Date   CHOL 142 09/05/2019   HDL 57 09/05/2019   LDLCALC 67 09/05/2019   TRIG 95 09/05/2019   CHOLHDL 2.5 09/05/2019   Lab Results  Component Value Date   TSH 1.830 02/16/2019   Lab Results  Component Value Date   HGBA1C 5.5 02/16/2019   Lab Results  Component Value Date   WBC 11.9 (H) 09/05/2019   HGB 16.7 (H) 09/05/2019   HCT 48.7 (H) 09/05/2019    MCV 91 09/05/2019   PLT 227 09/05/2019   Lab Results  Component Value Date   ALT 25 09/05/2019   AST 26 09/05/2019   ALKPHOS 62 09/05/2019   BILITOT 0.4 09/05/2019     Review of Systems  Constitutional: Negative for chills, fatigue and fever.  Respiratory: Negative for cough and chest tightness.   Cardiovascular: Negative for chest pain and leg swelling.  Gastrointestinal: Positive for abdominal pain. Negative for constipation and diarrhea.  Genitourinary: Positive for dysuria, frequency and urgency.  Neurological: Positive for tremors. Negative for dizziness, weakness, light-headedness, numbness and headaches.    Patient Active Problem List   Diagnosis Date Noted  . Vitamin D deficiency 09/06/2019  . Other specified anxiety disorders 02/16/2019  . Lichen sclerosus et atrophicus of the vulva 11/25/2018  . Essential hypertension 11/24/2018  . SVT (supraventricular tachycardia) ([place]) 11/24/2018  . Old lacunar stroke without late effect 11/24/2018  . Current every day smoker 10/25/2015  . Hx of hepatitis C 10/25/2015  . CAD in native artery 05/24/2015  . Obstructive sleep apnea 03/18/2015  . GERD (gastroesophageal reflux disease) 06/12/2011  . Mixed hyperlipidemia 06/12/2011    Allergies  Allergen Reactions  . Gabapentin Other (See Comments)    Unconscious   . Morphine Hives and Shortness Of Breath    Stopped breathing   . Scopolamine Other (See Comments)    Unconscious   . Codeine Nausea Only and  Other (See Comments)    NAUSEA/VOMITING   . Sulfa Antibiotics Hives and Nausea Only    Other Reaction: Not Assessed     Past Surgical History:  Procedure Laterality Date  . CARDIAC ELECTROPHYSIOLOGY STUDY AND ABLATION  2006   for SVT  . COLONOSCOPY  2006  . CORONARY ANGIOPLASTY WITH STENT PLACEMENT  2001   to Circumfx  . ESOPHAGOGASTRODUODENOSCOPY  2017  . LAPAROSCOPIC CHOLECYSTECTOMY  04/25/2019   @ [facility]  . LAPAROSCOPIC GASTRIC SLEEVE RESECTION  11/07/2015  .  TOTAL ABDOMINAL HYSTERECTOMY  1992   CIS cervix    Social History   Tobacco Use  . Smoking status: Current Every Day Smoker    Packs/day: 0.50    Years: 32.00    Pack years: 16.00    Types: Cigarettes  . Smokeless tobacco: Never Used  Substance Use Topics  . Alcohol use: Yes    Comment: rare  . Drug use: Never     Medication list has been reviewed and updated.  Current Meds  Medication Sig  . aspirin EC 81 MG tablet Take 1 tablet by mouth daily.  . Black Elderberry 50 MG/5ML SYRP Take by mouth.  . clobetasol cream (TEMOVATE) 0.05 % APPLY TO THE AFFECTED AREA TWICE DAILY  . escitalopram (LEXAPRO) 10 MG tablet Take 1 tablet (10 mg total) by mouth daily.  Marland Kitchen lisinopril-hydrochlorothiazide (ZESTORETIC) 20-25 MG tablet Take 1 tablet by mouth daily.  . Multiple Vitamins-Minerals (ALIVE WOMENS ENERGY) TABS Take by mouth.  . rosuvastatin (CRESTOR) 40 MG tablet Take 1 tablet (40 mg total) by mouth daily.    PHQ 2/9 Scores 12/23/2019 09/05/2019 09/05/2019 02/16/2019  PHQ - 2 Score 0 0 0 0  PHQ- 9 Score 0 0 - 5    BP Readings from Last 3 Encounters:  12/23/19 112/68  09/05/19 126/62  02/16/19 128/82    Physical Exam Vitals and nursing note reviewed.  Constitutional:      Appearance: Normal appearance. She is well-developed.  Cardiovascular:     Rate and Rhythm: Normal rate and regular rhythm.     Heart sounds: Normal heart sounds.  Pulmonary:     Effort: Pulmonary effort is normal. No respiratory distress.     Breath sounds: Normal breath sounds.  Abdominal:     General: Bowel sounds are normal.     Palpations: Abdomen is soft.     Tenderness: There is abdominal tenderness in the suprapubic area. There is no guarding or rebound.  Musculoskeletal:        General: Normal range of motion.     Cervical back: Normal range of motion.     Right lower leg: No edema.     Left lower leg: No edema.  Skin:    General: Skin is warm.  Neurological:     Sensory: Sensation is intact.      Motor: Motor function is intact. No weakness, tremor or abnormal muscle tone.  Psychiatric:        Attention and Perception: Attention normal.        Mood and Affect: Mood normal.     Wt Readings from Last 3 Encounters:  12/23/19 128 lb (58.1 kg)  09/05/19 127 lb (57.6 kg)  02/16/19 127 lb (57.6 kg)    BP 112/68   Pulse 67   Temp (!) 97.5 F (36.4 C) (Temporal)   Ht 5' 1.25" (1.556 m)   Wt 128 lb (58.1 kg)   SpO2 97%   BMI 23.99 kg/m  Assessment and Plan: 1. Acute cystitis without hematuria Will treat presumptively since UA is negative Increase fluid intake - nitrofurantoin, macrocrystal-monohydrate, (MACROBID) 100 MG capsule; Take 1 capsule (100 mg total) by mouth 2 (two) times daily for 5 days.  Dispense: 10 capsule; Refill: 0  2. Tremor of both hands None seen today. - Ambulatory referral to Neurology  3. Colon cancer screening Due for screening. - Fecal occult blood, imunochemical   Partially dictated using Editor, commissioning. Any errors are unintentional.  Halina Maidens, MD [facility]  12/23/2019

## 2020-01-25 DIAGNOSIS — F411 Generalized anxiety disorder: Secondary | ICD-10-CM | POA: Diagnosis not present

## 2020-01-25 DIAGNOSIS — R251 Tremor, unspecified: Secondary | ICD-10-CM | POA: Diagnosis not present

## 2020-01-25 DIAGNOSIS — Z8673 Personal history of transient ischemic attack (TIA), and cerebral infarction without residual deficits: Secondary | ICD-10-CM | POA: Diagnosis not present

## 2020-02-17 ENCOUNTER — Encounter: Payer: Medicare HMO | Admitting: Internal Medicine

## 2020-04-09 ENCOUNTER — Ambulatory Visit (INDEPENDENT_AMBULATORY_CARE_PROVIDER_SITE_OTHER): Payer: Medicare HMO | Admitting: Internal Medicine

## 2020-04-09 ENCOUNTER — Other Ambulatory Visit: Payer: Self-pay

## 2020-04-09 ENCOUNTER — Encounter: Payer: Self-pay | Admitting: Internal Medicine

## 2020-04-09 VITALS — BP 112/70 | HR 70 | Ht 61.25 in | Wt 130.0 lb

## 2020-04-09 DIAGNOSIS — E782 Mixed hyperlipidemia: Secondary | ICD-10-CM | POA: Diagnosis not present

## 2020-04-09 DIAGNOSIS — I471 Supraventricular tachycardia, unspecified: Secondary | ICD-10-CM

## 2020-04-09 DIAGNOSIS — Z1231 Encounter for screening mammogram for malignant neoplasm of breast: Secondary | ICD-10-CM | POA: Diagnosis not present

## 2020-04-09 DIAGNOSIS — E2839 Other primary ovarian failure: Secondary | ICD-10-CM | POA: Diagnosis not present

## 2020-04-09 DIAGNOSIS — Z1211 Encounter for screening for malignant neoplasm of colon: Secondary | ICD-10-CM

## 2020-04-09 DIAGNOSIS — K219 Gastro-esophageal reflux disease without esophagitis: Secondary | ICD-10-CM

## 2020-04-09 DIAGNOSIS — I10 Essential (primary) hypertension: Secondary | ICD-10-CM

## 2020-04-09 DIAGNOSIS — Z0000 Encounter for general adult medical examination without abnormal findings: Secondary | ICD-10-CM | POA: Diagnosis not present

## 2020-04-09 DIAGNOSIS — I2510 Atherosclerotic heart disease of native coronary artery without angina pectoris: Secondary | ICD-10-CM

## 2020-04-09 DIAGNOSIS — E559 Vitamin D deficiency, unspecified: Secondary | ICD-10-CM | POA: Diagnosis not present

## 2020-04-09 DIAGNOSIS — Z13820 Encounter for screening for osteoporosis: Secondary | ICD-10-CM | POA: Diagnosis not present

## 2020-04-09 DIAGNOSIS — G250 Essential tremor: Secondary | ICD-10-CM

## 2020-04-09 LAB — POCT URINALYSIS DIPSTICK
Bilirubin, UA: NEGATIVE
Blood, UA: NEGATIVE
Glucose, UA: NEGATIVE
Ketones, UA: NEGATIVE
Leukocytes, UA: NEGATIVE
Nitrite, UA: NEGATIVE
Protein, UA: NEGATIVE
Spec Grav, UA: 1.015 (ref 1.010–1.025)
Urobilinogen, UA: 0.2 E.U./dL
pH, UA: 5 (ref 5.0–8.0)

## 2020-04-09 NOTE — Progress Notes (Signed)
Date:  04/09/2020   Name:  Tiffany Bonilla   DOB:  22-Apr-1956   MRN:  [id number]   Chief Complaint: Annual Exam (Breast Exam. No pap. )  Tiffany Bonilla is a 64 y.o. female who presents today for her Complete Annual Exam. She feels well. She reports exercising - walking 3 times weekly. She reports she is sleeping well. Breast complaints - none.  Mammogram: 2016 DEXA: none Pap smear: aged out Colonoscopy: FIT given in April - cancelled due to date issues  Immunization History  Administered Date(s) Administered  . Influenza,inj,Quad PF,6+ Mos 07/13/2019  . Influenza-Unspecified 06/28/2013, 06/18/2015  . Moderna SARS-COVID-2 Vaccination 09/14/2019, 10/24/2019  . Pneumococcal Polysaccharide-23 06/29/2015, 11/25/2018  . Pneumococcal-Unspecified 01/13/2011  . Tdap 10/31/2010    Hypertension This is a chronic problem. The problem is controlled. Pertinent negatives include no chest pain, headaches, palpitations, peripheral edema or shortness of breath. Risk factors for coronary artery disease include dyslipidemia. Past treatments include ACE inhibitors and diuretics. The current treatment provides significant improvement. Hypertensive end-organ damage includes CAD/MI.  Hyperlipidemia The problem is controlled. Pertinent negatives include no chest pain or shortness of breath. Current antihyperlipidemic treatment includes statins.    Lab Results  Component Value Date   CREATININE 1.39 (H) 09/05/2019   BUN 20 09/05/2019   NA 140 09/05/2019   K 3.6 09/05/2019   CL 99 09/05/2019   CO2 25 09/05/2019   Lab Results  Component Value Date   CHOL 142 09/05/2019   HDL 57 09/05/2019   LDLCALC 67 09/05/2019   TRIG 95 09/05/2019   CHOLHDL 2.5 09/05/2019   Lab Results  Component Value Date   TSH 1.830 02/16/2019   Lab Results  Component Value Date   HGBA1C 5.5 02/16/2019   Lab Results  Component Value Date   WBC 11.9 (H) 09/05/2019   HGB 16.7 (H) 09/05/2019   HCT  48.7 (H) 09/05/2019   MCV 91 09/05/2019   PLT 227 09/05/2019   Lab Results  Component Value Date   ALT 25 09/05/2019   AST 26 09/05/2019   ALKPHOS 62 09/05/2019   BILITOT 0.4 09/05/2019     Review of Systems  Constitutional: Negative for chills, fatigue and fever.  HENT: Negative for congestion, hearing loss, tinnitus, trouble swallowing and voice change.   Eyes: Negative for visual disturbance.  Respiratory: Negative for cough, chest tightness, shortness of breath and wheezing.   Cardiovascular: Negative for chest pain, palpitations and leg swelling.  Gastrointestinal: Positive for diarrhea. Negative for abdominal pain, constipation and vomiting.  Endocrine: Negative for polydipsia and polyuria.  Genitourinary: Negative for dysuria, frequency, genital sores, vaginal bleeding and vaginal discharge.  Musculoskeletal: Negative for arthralgias, gait problem and joint swelling.  Skin: Negative for color change and rash.  Neurological: Negative for dizziness, tremors, light-headedness and headaches.  Hematological: Negative for adenopathy. Does not bruise/bleed easily.  Psychiatric/Behavioral: Negative for dysphoric mood and sleep disturbance. The patient is not nervous/anxious.     Patient Active Problem List   Diagnosis Date Noted  . Tremor 02/01/2020  . Vitamin D deficiency 09/06/2019  . Other specified anxiety disorders 02/16/2019  . Lichen sclerosus et atrophicus of the vulva 11/25/2018  . Essential hypertension 11/24/2018  . SVT (supraventricular tachycardia) ([place]) 11/24/2018  . Old lacunar stroke without late effect 11/24/2018  . Current every day smoker 10/25/2015  . Hx of hepatitis C 10/25/2015  . CAD in native artery 05/24/2015  . Obstructive sleep apnea 03/18/2015  . GERD (gastroesophageal  reflux disease) 06/12/2011  . Mixed hyperlipidemia 06/12/2011    Allergies  Allergen Reactions  . Gabapentin Other (See Comments)    Unconscious   . Morphine Hives and  Shortness Of Breath    Stopped breathing   . Scopolamine Other (See Comments)    Unconscious   . Codeine Nausea Only and Other (See Comments)    NAUSEA/VOMITING   . Sulfa Antibiotics Hives and Nausea Only    Other Reaction: Not Assessed     Past Surgical History:  Procedure Laterality Date  . CARDIAC ELECTROPHYSIOLOGY STUDY AND ABLATION  2006   for SVT  . COLONOSCOPY  2006  . CORONARY ANGIOPLASTY WITH STENT PLACEMENT  2001   to Circumfx  . ESOPHAGOGASTRODUODENOSCOPY  2017  . LAPAROSCOPIC CHOLECYSTECTOMY  04/25/2019   @ [facility]  . LAPAROSCOPIC GASTRIC SLEEVE RESECTION  11/07/2015  . TOTAL ABDOMINAL HYSTERECTOMY  1992   CIS cervix    Social History   Tobacco Use  . Smoking status: Current Every Day Smoker    Packs/day: 0.50    Years: 40.00    Pack years: 20.00    Types: Cigarettes    Start date: 5  . Smokeless tobacco: Never Used  Vaping Use  . Vaping Use: Never used  Substance Use Topics  . Alcohol use: Yes    Comment: rare  . Drug use: Never     Medication list has been reviewed and updated.  Current Meds  Medication Sig  . aspirin EC 81 MG tablet Take 1 tablet by mouth daily.  . Black Elderberry 50 MG/5ML SYRP Take by mouth.  . cholecalciferol (VITAMIN D3) 25 MCG (1000 UNIT) tablet Take 2,000 Units by mouth daily.  . clobetasol cream (TEMOVATE) 0.05 % APPLY TO THE AFFECTED AREA TWICE DAILY  . escitalopram (LEXAPRO) 10 MG tablet Take 1 tablet (10 mg total) by mouth daily.  Marland Kitchen gabapentin (NEURONTIN) 100 MG capsule Take 200 mg by mouth in the morning and at bedtime. Tremors  . lisinopril-hydrochlorothiazide (ZESTORETIC) 20-25 MG tablet Take 1 tablet by mouth daily.  . Multiple Vitamins-Minerals (ALIVE WOMENS ENERGY) TABS Take by mouth.  . rosuvastatin (CRESTOR) 40 MG tablet Take 1 tablet (40 mg total) by mouth daily.    PHQ 2/9 Scores 04/09/2020 12/23/2019 09/05/2019 09/05/2019  PHQ - 2 Score 0 0 0 0  PHQ- 9 Score 0 0 0 -    GAD 7 : Generalized Anxiety Score  12/23/2019 02/16/2019  Nervous, Anxious, on Edge 0 3  Control/stop worrying 0 1  Worry too much - different things 0 1  Trouble relaxing 0 1  Restless 0 1  Easily annoyed or irritable 0 2  Afraid - awful might happen 0 3  Total GAD 7 Score 0 12  Anxiety Difficulty Not difficult at all Very difficult    BP Readings from Last 3 Encounters:  04/09/20 112/70  12/23/19 112/68  09/05/19 126/62    Physical Exam Vitals and nursing note reviewed.  Constitutional:      General: She is not in acute distress.    Appearance: She is well-developed.  HENT:     Head: Normocephalic and atraumatic.     Right Ear: Tympanic membrane and ear canal normal.     Left Ear: Tympanic membrane and ear canal normal.     Nose:     Right Sinus: No maxillary sinus tenderness.     Left Sinus: No maxillary sinus tenderness.  Eyes:     General: No scleral icterus.  Right eye: No discharge.        Left eye: No discharge.     Conjunctiva/sclera: Conjunctivae normal.  Neck:     Thyroid: No thyromegaly.     Vascular: No carotid bruit.  Cardiovascular:     Rate and Rhythm: Normal rate and regular rhythm.     Pulses: Normal pulses.     Heart sounds: Normal heart sounds.  Pulmonary:     Effort: Pulmonary effort is normal. No respiratory distress.     Breath sounds: No wheezing.  Chest:     Breasts:        Right: No mass, nipple discharge, skin change or tenderness.        Left: No mass, nipple discharge, skin change or tenderness.  Abdominal:     General: Bowel sounds are normal.     Palpations: Abdomen is soft.     Tenderness: There is no abdominal tenderness.  Musculoskeletal:     Cervical back: Normal range of motion. No erythema.     Right lower leg: No edema.     Left lower leg: No edema.  Lymphadenopathy:     Cervical: No cervical adenopathy.  Skin:    General: Skin is warm and dry.     Findings: No rash.  Neurological:     Mental Status: She is alert and oriented to person, place, and  time.     Cranial Nerves: No cranial nerve deficit.     Sensory: No sensory deficit.     Deep Tendon Reflexes: Reflexes are normal and symmetric.  Psychiatric:        Attention and Perception: Attention normal.        Mood and Affect: Mood normal.     Wt Readings from Last 3 Encounters:  04/09/20 130 lb (59 kg)  12/23/19 128 lb (58.1 kg)  09/05/19 127 lb (57.6 kg)    BP 112/70   Pulse 70   Ht 5' 1.25" (1.556 m)   Wt 130 lb (59 kg)   SpO2 97%   BMI 24.36 kg/m   Assessment and Plan: 1. Annual physical exam Normal exam Maintaining weight loss now 6 years from gastric sleeve - POCT urinalysis dipstick  2. Encounter for screening mammogram for breast cancer - MM 3D SCREEN BREAST BILATERAL; Future  3. Colon cancer screening - Fecal occult blood, imunochemical  4. Encounter for screening for osteoporosis DEXA ordered at [place]. Essential hypertension Clinically stable exam with well controlled BP on lisinopril hct. Tolerating medications without side effects at this time. Pt to continue current regimen and low sodium diet; benefits of regular exercise as able discussed. - CBC with Differential/Platelet - Comprehensive metabolic panel - TSH  6. Gastroesophageal reflux disease, unspecified whether esophagitis present No current sx.  7. Mixed hyperlipidemia Tolerating statin medication without side effects at this time LDL is at goal of < 70 on current dose Continue same therapy without change at this time. - Lipid panel  8. Vitamin D deficiency Continue daily supplement - will advise on dose change when labs and DEXA return - VITAMIN D 25 Hydroxy (Vit-D Deficiency, Fractures)  9. CAD in native artery Stable without any recent symptoms  10. SVT (supraventricular tachycardia) ([facility]) Not recurrent  11. Benign essential tremor Now on low dose gabapentin from Neurology and doing well  12. Ovarian failure - DG Bone Density; Future   Partially dictated  using Editor, commissioning. Any errors are unintentional.  Halina Maidens, MD [facility]  04/09/2020

## 2020-04-10 LAB — CBC WITH DIFFERENTIAL/PLATELET
Basophils Absolute: 0.1 10*3/uL (ref 0.0–0.2)
Basos: 2 %
EOS (ABSOLUTE): 0.7 10*3/uL — ABNORMAL HIGH (ref 0.0–0.4)
Eos: 8 %
Hematocrit: 47.7 % — ABNORMAL HIGH (ref 34.0–46.6)
Hemoglobin: 15.7 g/dL (ref 11.1–15.9)
Immature Grans (Abs): 0 10*3/uL (ref 0.0–0.1)
Immature Granulocytes: 0 %
Lymphocytes Absolute: 2.3 10*3/uL (ref 0.7–3.1)
Lymphs: 24 %
MCH: 31.2 pg (ref 26.6–33.0)
MCHC: 32.9 g/dL (ref 31.5–35.7)
MCV: 95 fL (ref 79–97)
Monocytes Absolute: 0.8 10*3/uL (ref 0.1–0.9)
Monocytes: 8 %
Neutrophils Absolute: 5.4 10*3/uL (ref 1.4–7.0)
Neutrophils: 58 %
Platelets: 202 10*3/uL (ref 150–450)
RBC: 5.04 x10E6/uL (ref 3.77–5.28)
RDW: 13 % (ref 11.7–15.4)
WBC: 9.3 10*3/uL (ref 3.4–10.8)

## 2020-04-10 LAB — COMPREHENSIVE METABOLIC PANEL
ALT: 21 IU/L (ref 0–32)
AST: 26 IU/L (ref 0–40)
Albumin/Globulin Ratio: 1.8 (ref 1.2–2.2)
Albumin: 4.2 g/dL (ref 3.8–4.8)
Alkaline Phosphatase: 46 IU/L — ABNORMAL LOW (ref 48–121)
BUN/Creatinine Ratio: 17 (ref 12–28)
BUN: 20 mg/dL (ref 8–27)
Bilirubin Total: 0.4 mg/dL (ref 0.0–1.2)
CO2: 26 mmol/L (ref 20–29)
Calcium: 9.6 mg/dL (ref 8.7–10.3)
Chloride: 99 mmol/L (ref 96–106)
Creatinine, Ser: 1.16 mg/dL — ABNORMAL HIGH (ref 0.57–1.00)
GFR calc Af Amer: 57 mL/min/{1.73_m2} — ABNORMAL LOW (ref >59)
GFR calc non Af Amer: 50 mL/min/{1.73_m2} — ABNORMAL LOW (ref >59)
Globulin, Total: 2.4 g/dL (ref 1.5–4.5)
Glucose: 81 mg/dL (ref 65–99)
Potassium: 3.8 mmol/L (ref 3.5–5.2)
Sodium: 140 mmol/L (ref 134–144)
Total Protein: 6.6 g/dL (ref 6.0–8.5)

## 2020-04-10 LAB — LIPID PANEL
Chol/HDL Ratio: 2.4 ratio (ref 0.0–4.4)
Cholesterol, Total: 152 mg/dL (ref 100–199)
HDL: 64 mg/dL (ref >39)
LDL Chol Calc (NIH): 74 mg/dL (ref 0–99)
Triglycerides: 74 mg/dL (ref 0–149)
VLDL Cholesterol Cal: 14 mg/dL (ref 5–40)

## 2020-04-10 LAB — VITAMIN D 25 HYDROXY (VIT D DEFICIENCY, FRACTURES): Vit D, 25-Hydroxy: 37 ng/mL (ref 30.0–100.0)

## 2020-04-10 LAB — TSH: TSH: 1.3 u[IU]/mL (ref 0.450–4.500)

## 2020-04-23 DIAGNOSIS — L03213 Periorbital cellulitis: Secondary | ICD-10-CM | POA: Diagnosis not present

## 2020-04-24 ENCOUNTER — Inpatient Hospital Stay: Admission: RE | Admit: 2020-04-24 | Payer: Self-pay | Source: Ambulatory Visit

## 2020-04-24 ENCOUNTER — Other Ambulatory Visit: Payer: Self-pay

## 2020-04-25 DIAGNOSIS — H1013 Acute atopic conjunctivitis, bilateral: Secondary | ICD-10-CM | POA: Diagnosis not present

## 2020-04-25 DIAGNOSIS — H0015 Chalazion left lower eyelid: Secondary | ICD-10-CM | POA: Diagnosis not present

## 2020-06-15 ENCOUNTER — Other Ambulatory Visit: Payer: Self-pay

## 2020-06-15 DIAGNOSIS — N904 Leukoplakia of vulva: Secondary | ICD-10-CM

## 2020-06-15 MED ORDER — CLOBETASOL PROPIONATE 0.05 % EX CREA: TOPICAL_CREAM | Freq: Two times a day (BID) | CUTANEOUS | 1 refills | Status: DC

## 2020-06-17 ENCOUNTER — Encounter: Payer: Self-pay | Admitting: Internal Medicine

## 2020-06-19 ENCOUNTER — Other Ambulatory Visit: Payer: Self-pay

## 2020-06-19 ENCOUNTER — Encounter: Payer: Self-pay | Admitting: Internal Medicine

## 2020-06-19 ENCOUNTER — Telehealth: Payer: Self-pay | Admitting: Internal Medicine

## 2020-06-19 NOTE — Telephone Encounter (Signed)
Call pt she wants [facility] Mail order removed from list. [employer] was removed.  KP

## 2020-06-19 NOTE — Telephone Encounter (Signed)
Pt called to speak with Nurse about her medications/ Pt is no longer going to use [facility] due to always having complications/ Pt would like to use  [facility], [address] AT [place] Phone:  [phone]  Fax:  [phone]     For now on/ Pt is out of the medication below and would like a refill for clobetasol cream (TEMOVATE) 0.05 %  Sent to [facility] location above / please advise

## 2020-08-20 ENCOUNTER — Ambulatory Visit: Payer: Medicare HMO

## 2020-08-20 ENCOUNTER — Ambulatory Visit (INDEPENDENT_AMBULATORY_CARE_PROVIDER_SITE_OTHER): Payer: Medicare HMO

## 2020-08-20 DIAGNOSIS — Z0000 Encounter for general adult medical examination without abnormal findings: Secondary | ICD-10-CM | POA: Diagnosis not present

## 2020-08-20 DIAGNOSIS — Z1211 Encounter for screening for malignant neoplasm of colon: Secondary | ICD-10-CM | POA: Diagnosis not present

## 2020-08-20 NOTE — Patient Instructions (Signed)
Tiffany Bonilla , Thank you for taking time to come for your Medicare Wellness Visit. I appreciate your ongoing commitment to your health goals. Please review the following plan we discussed and let me know if I can assist you in the future.   Screening recommendations/referrals: Colonoscopy: due; Cologuard ordered today.  Mammogram: done 08/13/15. Please call [phone] to schedule your mammogram and bone density screening.   Recommended yearly ophthalmology/optometry visit for glaucoma screening and checkup Recommended yearly dental visit for hygiene and checkup  Vaccinations: Influenza vaccine: due Pneumococcal vaccine: done 11/25/18 Tdap vaccine: done 10/31/10 Shingles vaccine: Shingrix discussed. Please contact your pharmacy for coverage information.  Covid-19: done 09/14/19 & 10/24/19  Advanced directives: Advance directive discussed with you today. I have provided a copy for you to complete at home and have notarized. Once this is complete please bring a copy in to our office so we can scan it into your chart.  Conditions/risks identified: If you wish to quit smoking, help is available. For free tobacco cessation program offerings call the [facility] at 213-092-3083 or Live Well Line at 6305417988. You may also visit [email or web address] or email [email or web address] [email or web address] for more information on other programs.   Next appointment: Follow up in one year for your annual wellness visit     Preventive Care 65 Years and Older, Female Preventive care refers to lifestyle choices and visits with your health care provider that can promote health and wellness. What does preventive care include?  A yearly physical exam. This is also called an annual well check.  Dental exams once or twice a year.  Routine eye exams. Ask your health care provider how often you should have your eyes checked.  Personal lifestyle choices, including:  Daily care of your teeth and  gums.  Regular physical activity.  Eating a healthy diet.  Avoiding tobacco and drug use.  Limiting alcohol use.  Practicing safe sex.  Taking low-dose aspirin every day.  Taking vitamin and mineral supplements as recommended by your health care provider. What happens during an annual well check? The services and screenings done by your health care provider during your annual well check will depend on your age, overall health, lifestyle risk factors, and family history of disease. Counseling  Your health care provider may ask you questions about your:  Alcohol use.  Tobacco use.  Drug use.  Emotional well-being.  Home and relationship well-being.  Sexual activity.  Eating habits.  History of falls.  Memory and ability to understand (cognition).  Work and work Statistician.  Reproductive health. Screening  You may have the following tests or measurements:  Height, weight, and BMI.  Blood pressure.  Lipid and cholesterol levels. These may be checked every 5 years, or more frequently if you are over 23 years old.  Skin check.  Lung cancer screening. You may have this screening every year starting at age 53 if you have a 30-pack-year history of smoking and currently smoke or have quit within the past 15 years.  Fecal occult blood test (FOBT) of the stool. You may have this test every year starting at age 45.  Flexible sigmoidoscopy or colonoscopy. You may have a sigmoidoscopy every 5 years or a colonoscopy every 10 years starting at age 86.  Hepatitis C blood test.  Hepatitis B blood test.  Sexually transmitted disease (STD) testing.  Diabetes screening. This is done by checking your blood sugar (glucose) after you have not eaten for a while (fasting). You may  have this done every 1-3 years.  Bone density scan. This is done to screen for osteoporosis. You may have this done starting at age 73.  Mammogram. This may be done every 1-2 years. Talk to your  health care provider about how often you should have regular mammograms. Talk with your health care provider about your test results, treatment options, and if necessary, the need for more tests. Vaccines  Your health care provider may recommend certain vaccines, such as:  Influenza vaccine. This is recommended every year.  Tetanus, diphtheria, and acellular pertussis (Tdap, Td) vaccine. You may need a Td booster every 10 years.  Zoster vaccine. You may need this after age 36.  Pneumococcal 13-valent conjugate (PCV13) vaccine. One dose is recommended after age 58.  Pneumococcal polysaccharide (PPSV23) vaccine. One dose is recommended after age 66. Talk to your health care provider about which screenings and vaccines you need and how often you need them. This information is not intended to replace advice given to you by your health care provider. Make sure you discuss any questions you have with your health care provider. Document Released: 09/14/2015 Document Revised: 05/07/2016 Document Reviewed: 06/19/2015 Elsevier Interactive Patient Education  2017 [place] Prevention in the Home Falls can cause injuries. They can happen to people of all ages. There are many things you can do to make your home safe and to help prevent falls. What can I do on the outside of my home?  Regularly fix the edges of walkways and driveways and fix any cracks.  Remove anything that might make you trip as you walk through a door, such as a raised step or threshold.  Trim any bushes or trees on the path to your home.  Use bright outdoor lighting.  Clear any walking paths of anything that might make someone trip, such as rocks or tools.  Regularly check to see if handrails are loose or broken. Make sure that both sides of any steps have handrails.  Any raised decks and porches should have guardrails on the edges.  Have any leaves, snow, or ice cleared regularly.  Use sand or salt on walking  paths during winter.  Clean up any spills in your garage right away. This includes oil or grease spills. What can I do in the bathroom?  Use night lights.  Install grab bars by the toilet and in the tub and shower. Do not use towel bars as grab bars.  Use non-skid mats or decals in the tub or shower.  If you need to sit down in the shower, use a plastic, non-slip stool.  Keep the floor dry. Clean up any water that spills on the floor as soon as it happens.  Remove soap buildup in the tub or shower regularly.  Attach bath mats securely with double-sided non-slip rug tape.  Do not have throw rugs and other things on the floor that can make you trip. What can I do in the bedroom?  Use night lights.  Make sure that you have a light by your bed that is easy to reach.  Do not use any sheets or blankets that are too big for your bed. They should not hang down onto the floor.  Have a firm chair that has side arms. You can use this for support while you get dressed.  Do not have throw rugs and other things on the floor that can make you trip. What can I do in the kitchen?  Clean up  any spills right away.  Avoid walking on wet floors.  Keep items that you use a lot in easy-to-reach places.  If you need to reach something above you, use a strong step stool that has a grab bar.  Keep electrical cords out of the way.  Do not use floor polish or wax that makes floors slippery. If you must use wax, use non-skid floor wax.  Do not have throw rugs and other things on the floor that can make you trip. What can I do with my stairs?  Do not leave any items on the stairs.  Make sure that there are handrails on both sides of the stairs and use them. Fix handrails that are broken or loose. Make sure that handrails are as long as the stairways.  Check any carpeting to make sure that it is firmly attached to the stairs. Fix any carpet that is loose or worn.  Avoid having throw rugs at  the top or bottom of the stairs. If you do have throw rugs, attach them to the floor with carpet tape.  Make sure that you have a light switch at the top of the stairs and the bottom of the stairs. If you do not have them, ask someone to add them for you. What else can I do to help prevent falls?  Wear shoes that:  Do not have high heels.  Have rubber bottoms.  Are comfortable and fit you well.  Are closed at the toe. Do not wear sandals.  If you use a stepladder:  Make sure that it is fully opened. Do not climb a closed stepladder.  Make sure that both sides of the stepladder are locked into place.  Ask someone to hold it for you, if possible.  Clearly mark and make sure that you can see:  Any grab bars or handrails.  First and last steps.  Where the edge of each step is.  Use tools that help you move around (mobility aids) if they are needed. These include:  Canes.  Walkers.  Scooters.  Crutches.  Turn on the lights when you go into a dark area. Replace any light bulbs as soon as they burn out.  Set up your furniture so you have a clear path. Avoid moving your furniture around.  If any of your floors are uneven, fix them.  If there are any pets around you, be aware of where they are.  Review your medicines with your doctor. Some medicines can make you feel dizzy. This can increase your chance of falling. Ask your doctor what other things that you can do to help prevent falls. This information is not intended to replace advice given to you by your health care provider. Make sure you discuss any questions you have with your health care provider. Document Released: 06/14/2009 Document Revised: 01/24/2016 Document Reviewed: 09/22/2014 Elsevier Interactive Patient Education  2017 [employer].

## 2020-08-20 NOTE — Progress Notes (Signed)
Subjective:   Tiffany Bonilla is a 64 y.o. female who presents for an Initial Medicare Annual Wellness Visit.  Virtual Visit via Telephone Note  I connected with  Tiffany Bonilla on 08/20/20 at 10:40 AM EST by telephone and verified that I am speaking with the correct person using two identifiers.  Location: Patient: work  Provider: [facility] Persons participating in the virtual visit: Tiffany Bonilla   I discussed the limitations, risks, security and privacy concerns of performing an evaluation and management service by telephone and the availability of in person appointments. The patient expressed understanding and agreed to proceed.  Interactive audio and video telecommunications were attempted between this nurse and patient, however failed, due to patient having technical difficulties OR patient did not have access to video capability.  We continued and completed visit with audio only.  Some vital signs may be absent or patient reported.   Clemetine Marker, LPN    Review of Systems     Cardiac Risk Factors include: advanced age (>3men, >59 women);hypertension;dyslipidemia     Objective:    There were no vitals filed for this visit. There is no height or weight on file to calculate BMI.  Advanced Directives 08/20/2020  Does Patient Have a Medical Advance Directive? No  Would patient like information on creating a medical advance directive? Yes (MAU/Ambulatory/Procedural Areas - Information given)    Current Medications (verified) Outpatient Encounter Medications as of 08/20/2020  Medication Sig  . aspirin EC 81 MG tablet Take 1 tablet by mouth daily.  . Black Elderberry 50 MG/5ML SYRP Take by mouth.  . cholecalciferol (VITAMIN D3) 25 MCG (1000 UNIT) tablet Take 2,000 Units by mouth daily.  . clobetasol cream (TEMOVATE) 0.05 % Apply topically 2 (two) times daily. to affected area  . escitalopram (LEXAPRO) 10 MG tablet Take 1 tablet (10 mg total) by  mouth daily.  Marland Kitchen gabapentin (NEURONTIN) 100 MG capsule Take 200 mg by mouth in the morning and at bedtime. Tremors  . lisinopril-hydrochlorothiazide (ZESTORETIC) 20-25 MG tablet Take 1 tablet by mouth daily.  . Melatonin 10 MG TABS Take 1 tablet by mouth at bedtime.  . Multiple Vitamins-Minerals (ALIVE WOMENS ENERGY) TABS Take by mouth.  . rosuvastatin (CRESTOR) 40 MG tablet Take 1 tablet (40 mg total) by mouth daily.   No facility-administered encounter medications on file as of 08/20/2020.    Allergies (verified) Morphine, Scopolamine, Codeine, and Sulfa antibiotics   History: Past Medical History:  Diagnosis Date  . Allergy   . Asthma   . Hyperlipidemia   . Hypertension   . Pre-diabetes 11/24/2018  . Stroke [facility])    Past Surgical History:  Procedure Laterality Date  . CARDIAC ELECTROPHYSIOLOGY STUDY AND ABLATION  2006   for SVT  . COLONOSCOPY  2006  . CORONARY ANGIOPLASTY WITH STENT PLACEMENT  2001   to Circumfx  . ESOPHAGOGASTRODUODENOSCOPY  2017  . LAPAROSCOPIC CHOLECYSTECTOMY  04/25/2019   @ [facility]  . LAPAROSCOPIC GASTRIC SLEEVE RESECTION  11/07/2015  . TOTAL ABDOMINAL HYSTERECTOMY  1992   CIS cervix   Family History  Problem Relation Age of Onset  . Aneurysm Mother    Social History   Socioeconomic History  . Marital status: Widowed    Spouse name: Not on file  . Number of children: 5  . Years of education: Not on file  . Highest education level: Not on file  Occupational History  . Not on file  Tobacco Use  . Smoking status: Current  Every Day Smoker    Packs/day: 0.50    Years: 40.00    Pack years: 20.00    Types: Cigarettes    Start date: 6  . Smokeless tobacco: Never Used  Vaping Use  . Vaping Use: Never used  Substance and Sexual Activity  . Alcohol use: Yes    Comment: rare  . Drug use: Never  . Sexual activity: Not Currently  Other Topics Concern  . Not on file  Social History Narrative   Pt lives with 2 of her daughters and 2  grandchildren   Social Determinants of Health   Financial Resource Strain: Low Risk   . Difficulty of Paying Living Expenses: Not hard at all  Food Insecurity: No Food Insecurity  . Worried About Charity fundraiser in the Last Year: Never true  . Ran Out of Food in the Last Year: Never true  Transportation Needs: No Transportation Needs  . Lack of Transportation (Medical): No  . Lack of Transportation (Non-Medical): No  Physical Activity: Inactive  . Days of Exercise per Week: 0 days  . Minutes of Exercise per Session: 0 min  Stress: No Stress Concern Present  . Feeling of Stress : Not at all  Social Connections: Socially Isolated  . Frequency of Communication with Friends and Family: More than three times a week  . Frequency of Social Gatherings with Friends and Family: More than three times a week  . Attends Religious Services: Never  . Active Member of Clubs or Organizations: No  . Attends Archivist Meetings: Never  . Marital Status: Widowed    Tobacco Counseling Ready to quit: No Counseling given: Not Answered   Clinical Intake:  Pre-visit preparation completed: Yes  Pain : No/denies pain     Nutritional Risks: None Diabetes: No  How often do you need to have someone help you when you read instructions, pamphlets, or other written materials from your doctor or pharmacy?: 1 - Never    Interpreter Needed?: No  Information entered by :: Clemetine Marker LPN   Activities of Daily Living In your present state of health, do you have any difficulty performing the following activities: 08/20/2020 09/05/2019  Hearing? N N  Comment declines hearing aids -  Vision? N N  Difficulty concentrating or making decisions? N N  Walking or climbing stairs? N N  Dressing or bathing? N N  Doing errands, shopping? N N  Preparing Food and eating ? N -  Using the Toilet? N -  In the past six months, have you accidently leaked urine? N -  Do you have problems with loss  of bowel control? N -  Managing your Medications? N -  Managing your Finances? N -  Housekeeping or managing your Housekeeping? N -  Some recent data might be hidden    Patient Care Team: Glean Hess, MD as PCP - General (Internal Medicine)  Indicate any recent Medical Services you may have received from other than [facility] providers in the past year (date may be approximate).     Assessment:   This is a routine wellness examination for [place].  Hearing/Vision screen  Hearing Screening   125Hz  250Hz  500Hz  1000Hz  2000Hz  3000Hz  4000Hz  6000Hz  8000Hz   Right ear:           Left ear:           Comments: Pt denies hearing difficulty  Vision Screening Comments: Annual vision screenings done by Dr. Mallie Mussel  Dietary issues and  exercise activities discussed: Current Exercise Habits: The patient does not participate in regular exercise at present, Exercise limited by: None identified  Goals   None    Depression Screen PHQ 2/9 Scores 08/20/2020 04/09/2020 12/23/2019 09/05/2019 09/05/2019 02/16/2019 11/25/2018  PHQ - 2 Score 0 0 0 0 0 0 0  PHQ- 9 Score - 0 0 0 - 5 -    Fall Risk Fall Risk  08/20/2020 04/09/2020 12/23/2019 02/16/2019 11/25/2018  Falls in the past year? 0 0 0 0 0  Number falls in past yr: 0 0 0 0 0  Injury with Fall? 0 0 0 0 0  Risk for fall due to : No Fall Risks No Fall Risks No Fall Risks - -  Follow up Falls prevention discussed Falls evaluation completed Falls evaluation completed Falls evaluation completed Falls evaluation completed    [place]:  Any stairs in or around the home? Yes  If so, are there any without handrails? No  Home free of loose throw rugs in walkways, pet beds, electrical cords, etc? Yes  Adequate lighting in your home to reduce risk of falls? Yes   ASSISTIVE DEVICES UTILIZED TO PREVENT FALLS:  Life alert? No  Use of a cane, walker or w/c? No  Grab bars in the bathroom? No  Shower chair or bench in shower? No   Elevated toilet seat or a handicapped toilet? No   TIMED UP AND GO:  Was the test performed? No . Telephonic visit.   Cognitive Function:        Immunizations Immunization History  Administered Date(s) Administered  . Influenza,inj,Quad PF,6+ Mos 07/13/2019  . Influenza-Unspecified 06/28/2013, 06/18/2015  . Moderna Sars-Covid-2 Vaccination 09/14/2019, 10/24/2019  . Pneumococcal Polysaccharide-23 06/29/2015, 11/25/2018  . Pneumococcal-Unspecified 01/13/2011  . Tdap 10/31/2010    TDAP status: Up to date  Flu Vaccine status: Declined, Education has been provided regarding the importance of this vaccine but patient still declined. Advised may receive this vaccine at local pharmacy or Health Dept. Aware to provide a copy of the vaccination record if obtained from local pharmacy or Health Dept. Verbalized acceptance and understanding.  Pneumococcal vaccine status: Up to date  Covid-19 vaccine status: Completed vaccines  Qualifies for Shingles Vaccine? Yes   Zostavax completed No   Shingrix Completed?: No.    Education has been provided regarding the importance of this vaccine. Patient has been advised to call insurance company to determine out of pocket expense if they have not yet received this vaccine. Advised may also receive vaccine at local pharmacy or Health Dept. Verbalized acceptance and understanding.  Screening Tests Health Maintenance  Topic Date Due  . COLONOSCOPY  Never done  . MAMMOGRAM  08/12/2017  . DEXA SCAN  Never done  . PNA vac Low Risk Adult (1 of 2 - PCV13) 11/25/2019  . INFLUENZA VACCINE  04/01/2020  . COVID-19 Vaccine (3 - Booster for Moderna series) 04/22/2020  . TETANUS/TDAP  10/30/2020  . Hepatitis C Screening  Completed    Health Maintenance  Health Maintenance Due  Topic Date Due  . COLONOSCOPY  Never done  . MAMMOGRAM  08/12/2017  . DEXA SCAN  Never done  . PNA vac Low Risk Adult (1 of 2 - PCV13) 11/25/2019  . INFLUENZA VACCINE   04/01/2020  . COVID-19 Vaccine (3 - Booster for Moderna series) 04/22/2020   Colorectal cancer screening: pt due for colonoscopy but not interested in procedure at this time; pt has completed FIT  tests in previous years. Pt is interested in Cologuard testing; ordered today.   Mammogram status: Completed 08/13/15. Repeat every year. Ordered 04/09/20  Bone Density status: Ordered 04/09/20. Pt provided with contact info and advised to call to schedule appt.  Lung Cancer Screening: (Low Dose CT Chest recommended if Age 47-80 years, 30 pack-year currently smoking OR have quit w/in 15years.) does qualify.   Lung Cancer Screening Referral: An Epic message has been sent to Burgess Estelle, RN (Oncology Nurse Navigator) regarding the possible need for this exam. Raquel Sarna will review the patient's chart to determine if the patient truly qualifies for the exam. If the patient qualifies, Raquel Sarna will order the Low Dose CT of the chest to facilitate the scheduling of this exam.  Additional Screening:  Hepatitis C Screening: does qualify; Completed 10/25/15  Vision Screening: Recommended annual ophthalmology exams for early detection of glaucoma and other disorders of the eye. Is the patient up to date with their annual eye exam?  Yes  Who is the provider or what is the name of the office in which the patient attends annual eye exams? Dr. Mallie Mussel  Dental Screening: Recommended annual dental exams for proper oral hygiene  Community Resource Referral / Chronic Care Management: CRR required this visit?  No   CCM required this visit?  No      Plan:     I have personally reviewed and noted the following in the patient's chart:   . Medical and social history . Use of alcohol, tobacco or illicit drugs  . Current medications and supplements . Functional ability and status . Nutritional status . Physical activity . Advanced directives . List of other physicians . Hospitalizations, surgeries, and ER visits in  previous 12 months . Vitals . Screenings to include cognitive, depression, and falls . Referrals and appointments  In addition, I have reviewed and discussed with patient certain preventive protocols, quality metrics, and best practice recommendations. A written personalized care plan for preventive services as well as general preventive health recommendations were provided to patient.     Clemetine Marker, LPN   [id number]   Nurse Notes: none

## 2020-08-22 ENCOUNTER — Telehealth: Payer: Self-pay | Admitting: *Deleted

## 2020-08-22 NOTE — Telephone Encounter (Signed)
Received referral for low dose lung cancer screening CT scan. Message left at phone number listed in EMR for patient to call me back to facilitate scheduling scan.  

## 2020-08-30 ENCOUNTER — Other Ambulatory Visit: Payer: Self-pay

## 2020-08-30 DIAGNOSIS — F418 Other specified anxiety disorders: Secondary | ICD-10-CM

## 2020-08-30 DIAGNOSIS — E782 Mixed hyperlipidemia: Secondary | ICD-10-CM

## 2020-08-30 DIAGNOSIS — I10 Essential (primary) hypertension: Secondary | ICD-10-CM

## 2020-08-30 MED ORDER — GABAPENTIN 100 MG PO CAPS: 200.0000 mg | ORAL_CAPSULE | Freq: Two times a day (BID) | ORAL | 0 refills | Status: DC

## 2020-08-30 MED ORDER — ESCITALOPRAM OXALATE 10 MG PO TABS: 10.0000 mg | ORAL_TABLET | Freq: Every day | ORAL | 0 refills | Status: DC

## 2020-08-30 MED ORDER — LISINOPRIL-HYDROCHLOROTHIAZIDE 20-25 MG PO TABS: 1.0000 | ORAL_TABLET | Freq: Every day | ORAL | 0 refills | Status: DC

## 2020-08-30 MED ORDER — ROSUVASTATIN CALCIUM 40 MG PO TABS: 40.0000 mg | ORAL_TABLET | Freq: Every day | ORAL | 0 refills | Status: DC

## 2020-09-07 ENCOUNTER — Telehealth: Payer: Self-pay | Admitting: *Deleted

## 2020-09-07 DIAGNOSIS — Z122 Encounter for screening for malignant neoplasm of respiratory organs: Secondary | ICD-10-CM

## 2020-09-07 DIAGNOSIS — Z87891 Personal history of nicotine dependence: Secondary | ICD-10-CM

## 2020-09-07 NOTE — Telephone Encounter (Signed)
Received referral for low dose lung cancer screening CT scan. Message left at phone number listed in EMR for patient to call me back to facilitate scheduling scan.  

## 2020-09-07 NOTE — Telephone Encounter (Signed)
Received referral for initial lung cancer screening scan. Contacted patient and obtained smoking history,(current, 46 pack year) as well as answering questions related to screening process. Patient denies signs of lung cancer such as weight loss or hemoptysis. Patient denies comorbidity that would prevent curative treatment if lung cancer were found. Patient is scheduled for shared decision making visit and CT scan on 09/14/20.

## 2020-09-07 NOTE — Addendum Note (Signed)
Addended by: Lieutenant Diego on: 09/07/2020 11:44 AM   Modules accepted: Orders

## 2020-09-14 ENCOUNTER — Ambulatory Visit
Admission: RE | Admit: 2020-09-14 | Discharge: 2020-09-14 | Disposition: A | Discharge status: Home / Self Care | Payer: Medicare HMO | Source: Ambulatory Visit | Attending: Nurse Practitioner | Admitting: Nurse Practitioner

## 2020-09-14 ENCOUNTER — Other Ambulatory Visit: Payer: Self-pay

## 2020-09-14 ENCOUNTER — Encounter: Payer: Self-pay | Admitting: Emergency Medicine

## 2020-09-14 ENCOUNTER — Ambulatory Visit: Payer: Self-pay | Admitting: *Deleted

## 2020-09-14 ENCOUNTER — Ambulatory Visit
Admission: EM | Admit: 2020-09-14 | Discharge: 2020-09-14 | Disposition: A | Discharge status: Home / Self Care | Payer: Medicare HMO | Attending: Family Medicine | Admitting: Family Medicine

## 2020-09-14 ENCOUNTER — Telehealth: Payer: Self-pay

## 2020-09-14 ENCOUNTER — Inpatient Hospital Stay: Payer: Medicare HMO | Attending: Nurse Practitioner | Admitting: Nurse Practitioner

## 2020-09-14 DIAGNOSIS — H5789 Other specified disorders of eye and adnexa: Secondary | ICD-10-CM | POA: Diagnosis not present

## 2020-09-14 DIAGNOSIS — Z122 Encounter for screening for malignant neoplasm of respiratory organs: Secondary | ICD-10-CM | POA: Insufficient documentation

## 2020-09-14 DIAGNOSIS — H00014 Hordeolum externum left upper eyelid: Secondary | ICD-10-CM

## 2020-09-14 DIAGNOSIS — Z87891 Personal history of nicotine dependence: Secondary | ICD-10-CM | POA: Insufficient documentation

## 2020-09-14 DIAGNOSIS — F17210 Nicotine dependence, cigarettes, uncomplicated: Secondary | ICD-10-CM | POA: Diagnosis not present

## 2020-09-14 MED ORDER — MOXIFLOXACIN HCL 0.5 % OP SOLN: 1.0000 [drp] | Freq: Three times a day (TID) | OPHTHALMIC | 0 refills | Status: AC

## 2020-09-14 NOTE — Telephone Encounter (Signed)
Summary: Eye pain    Pt has a stye in left eye, painful. And other eye is red       Reason for Disposition . Redness spreads around the eye (both upper and lower eyelid are red)  Answer Assessment - Initial Assessment Questions 1. LOCATION: "Which eye has the sty?" "Upper or lower eyelid?"     L eye- upper 2. SIZE: "How big is it?" (Note: standard pencil eraser is 6 mm)     Swollen and small knot present- slightly smaller than pencil eraser 3. EYELID: "Is the eyelid swollen?" If Yes, ask: "How much?"     yes 4. REDNESS: "Has the redness spread onto the eyelid?"     yes 5. ONSET: "When did you notice the sty?"     This morning- patient is out of town- home now 6. VISION: "Do you have blurred vision?"      A little bit 7. PAIN: "Is it painful?" If Yes, ask: "How bad is the pain?"  (Scale 1-10; or mild, moderate, severe)     Yes- when touched- tender 8. CONTACTS: "Do you wear contacts?"     no 9. OTHER SYMPTOMS: "Do you have any other symptoms?" (e.g., fever)     Irritation in both eyes, itching and discharge from both eyes 10. PREGNANCY: "Is there any chance you are pregnant?" "When was your last menstrual period?"       n/a  Protocols used: STY-A-AH

## 2020-09-14 NOTE — ED Triage Notes (Signed)
Patient states she has a stye on her left eye and thinks she also has pink eye. She states she called her PCP and they told her that they couldn't see her and she needed to come to urgent care.

## 2020-09-14 NOTE — Telephone Encounter (Signed)
Copied from [place] [phone]. Topic: General - Call Back - No Documentation >> Sep 14, 2020  1:59 PM Erick Blinks wrote: Best contact: [phone]  Requesting a callback from nurse, complaining of a stye in eye. Transferring to triage. Other eye is red, stye in the left eye.

## 2020-09-14 NOTE — Telephone Encounter (Signed)
Noted  Pt will go to [facility].  KP

## 2020-09-14 NOTE — Progress Notes (Signed)
Virtual Visit via Video Enabled Telemedicine Note   I connected with [name] on 09/14/20 at 1:00 PM EST by video enabled telemedicine visit and verified that I am speaking with the correct person using two identifiers.   I discussed the limitations, risks, security and privacy concerns of performing an evaluation and management service by telemedicine and the availability of in-person appointments. I also discussed with the patient that there may be a patient responsible charge related to this service. The patient expressed understanding and agreed to proceed.   Other persons participating in the visit and their role in the encounter: Burgess Estelle, RN- checking in patient & navigation  Patient's location: home  Provider's location: Clinic  Chief Complaint: Low Dose CT Screening  Patient agreed to evaluation by telemedicine to discuss shared decision making for consideration of low dose CT lung cancer screening.    In accordance with CMS guidelines, patient has met eligibility criteria including age, absence of signs or symptoms of lung cancer.  Social History   Tobacco Use  . Smoking status: Current Every Day Smoker    Packs/day: 0.50    Years: 40.00    Pack years: 20.00    Types: Cigarettes    Start date: 67  . Smokeless tobacco: Never Used  Substance Use Topics  . Alcohol use: Yes    Comment: rare     A shared decision-making session was conducted prior to the performance of CT scan. This includes one or more decision aids, includes benefits and harms of screening, follow-up diagnostic testing, over-diagnosis, false positive rate, and total radiation exposure.   Counseling on the importance of adherence to annual lung cancer LDCT screening, impact of co-morbidities, and ability or willingness to undergo diagnosis and treatment is imperative for compliance of the program.   Counseling on the importance of continued smoking cessation for former smokers; the  importance of smoking cessation for current smokers, and information about tobacco cessation interventions have been given to patient including [place] and [employer] [place] programs.   Written order for lung cancer screening with LDCT has been given to the patient and any and all questions have been answered to the best of my abilities.    Yearly follow up will be coordinated by Burgess Estelle, Thoracic Navigator.  I discussed the assessment and treatment plan with the patient. The patient was provided an opportunity to ask questions and all were answered. The patient agreed with the plan and demonstrated an understanding of the instructions.   The patient was advised to call back or seek an in-person evaluation if the symptoms worsen or if the condition fails to improve as anticipated.   I provided 15 minutes of face-to-face video visit time dedicated to the care of this patient on the date of this encounter to include pre-visit review of smoking history, face-to-face time with the patient, and post visit ordering of testing/documentation.   Beckey Rutter, DNP, AGNP-C [place] at [facility] [phone] (clinic)

## 2020-09-14 NOTE — Telephone Encounter (Signed)
I tried to call this pt for Kie. Nobody said anything on the phone. Can you see if you can call her and tell her there is nothing you can do about a stye other than warm compresses. If she has tried this and has gotten "worse"- she can schedule an appt with Dr Army Melia next week

## 2020-09-14 NOTE — ED Provider Notes (Signed)
[facility]    CSN: [id number] Arrival date & time: 09/14/20  1522      History   Chief Complaint Chief Complaint  Patient presents with  . Stye   HPI  65 year old female presents with the above complaint.  Patient reports that she has developed a stye in her left eye.  She has also had bilateral eye drainage/crusting.  She states that she spoke with the PCPs office and was advised to come here for evaluation.  No fever.  No relieving factors.  Patient states that she has a history of periorbital cellulitis and is concerned about developing this.  She has applied warm compresses without resolution.  No other reported symptoms.  No other complaints.  Past Medical History:  Diagnosis Date  . Allergy   . Asthma   . Hyperlipidemia   . Hypertension   . Pre-diabetes 11/24/2018  . Stroke [facility])     Patient Active Problem List   Diagnosis Date Noted  . Benign essential tremor 04/09/2020  . Tremor 02/01/2020  . Vitamin D deficiency 09/06/2019  . Other specified anxiety disorders 02/16/2019  . Lichen sclerosus et atrophicus of the vulva 11/25/2018  . Essential hypertension 11/24/2018  . SVT (supraventricular tachycardia) ([place]) 11/24/2018  . Old lacunar stroke without late effect 11/24/2018  . Current every day smoker 10/25/2015  . Hx of hepatitis C 10/25/2015  . CAD in native artery 05/24/2015  . Obstructive sleep apnea 03/18/2015  . GERD (gastroesophageal reflux disease) 06/12/2011  . Mixed hyperlipidemia 06/12/2011    Past Surgical History:  Procedure Laterality Date  . CARDIAC ELECTROPHYSIOLOGY STUDY AND ABLATION  2006   for SVT  . COLONOSCOPY  2006  . CORONARY ANGIOPLASTY WITH STENT PLACEMENT  2001   to Circumfx  . ESOPHAGOGASTRODUODENOSCOPY  2017  . LAPAROSCOPIC CHOLECYSTECTOMY  04/25/2019   @ [facility]  . LAPAROSCOPIC GASTRIC SLEEVE RESECTION  11/07/2015  . TOTAL ABDOMINAL HYSTERECTOMY  1992   CIS cervix    OB History   No obstetric history on file.       Home Medications    Prior to Admission medications   Medication Sig Start Date End Date Taking? Authorizing Provider  aspirin EC 81 MG tablet Take 1 tablet by mouth daily.   Yes [provider]  Black Elderberry 50 MG/5ML SYRP Take by mouth.   Yes [provider]  cholecalciferol (VITAMIN D3) 25 MCG (1000 UNIT) tablet Take 2,000 Units by mouth daily.   Yes [provider]  clobetasol cream (TEMOVATE) 0.05 % Apply topically 2 (two) times daily. to affected area 06/15/20  Yes Glean Hess, MD  escitalopram (LEXAPRO) 10 MG tablet Take 1 tablet (10 mg total) by mouth daily. 08/30/20  Yes Glean Hess, MD  gabapentin (NEURONTIN) 100 MG capsule Take 2 capsules (200 mg total) by mouth in the morning and at bedtime. Tremors 08/30/20  Yes Glean Hess, MD  lisinopril-hydrochlorothiazide (ZESTORETIC) 20-25 MG tablet Take 1 tablet by mouth daily. 08/30/20  Yes Glean Hess, MD  Melatonin 10 MG TABS Take 1 tablet by mouth at bedtime.   Yes [provider]  moxifloxacin (VIGAMOX) 0.5 % ophthalmic solution Place 1 drop into both eyes 3 (three) times daily for 7 days. 09/14/20 09/21/20 Yes Coral Spikes, DO  Multiple Vitamins-Minerals (ALIVE WOMENS ENERGY) TABS Take by mouth.   Yes [provider]  rosuvastatin (CRESTOR) 40 MG tablet Take 1 tablet (40 mg total) by mouth daily. 08/30/20  Yes Army Melia,  Jesse Sans, MD    Family History Family History  Problem Relation Age of Onset  . Aneurysm Mother     Social History Social History   Tobacco Use  . Smoking status: Current Every Day Smoker    Packs/day: 0.50    Years: 40.00    Pack years: 20.00    Types: Cigarettes    Start date: 6  . Smokeless tobacco: Never Used  Vaping Use  . Vaping Use: Never used  Substance Use Topics  . Alcohol use: Yes    Comment: rare  . Drug use: Never     Allergies   Morphine, Scopolamine, Codeine, and Sulfa antibiotics   Review of  Systems Review of Systems Per HPI  Physical Exam Triage Vital Signs ED Triage Vitals  Enc Vitals Group     BP 09/14/20 1548 128/67     Pulse Rate 09/14/20 1548 68     Resp 09/14/20 1548 18     Temp 09/14/20 1548 98.4 F (36.9 C)     Temp Source 09/14/20 1548 Oral     SpO2 09/14/20 1548 99 %     Weight 09/14/20 1547 130 lb (59 kg)     Height 09/14/20 1547 5\' 1"  (1.549 m)     Head Circumference --      Peak Flow --      Pain Score 09/14/20 1547 0     Pain Loc --      Pain Edu? --      Excl. in [place]? --    Updated Vital Signs BP 128/67 (BP Location: Left Arm)   Pulse 68   Temp 98.4 F (36.9 C) (Oral)   Resp 18   Ht 5\' 1"  (1.549 m)   Wt 59 kg   SpO2 99%   BMI 24.56 kg/m   Visual Acuity Right Eye Distance:   Left Eye Distance:   Bilateral Distance:    Right Eye Near:   Left Eye Near:    Bilateral Near:     Physical Exam Constitutional:      General: She is not in acute distress.    Appearance: Normal appearance. She is not ill-appearing.  HENT:     Head: Normocephalic and atraumatic.  Eyes:     General:        Right eye: No discharge.        Left eye: No discharge.     Conjunctiva/sclera: Conjunctivae normal.     Comments: Normal-appearing conjunctiva bilaterally.  Stye noted of the left upper eyelid.  Mild erythema.  Pulmonary:     Effort: Pulmonary effort is normal. No respiratory distress.  Neurological:     Mental Status: She is alert.  Psychiatric:        Mood and Affect: Mood normal.        Behavior: Behavior normal.    UC Treatments / Results  Labs (all labs ordered are listed, but only abnormal results are displayed) Labs Reviewed - No data to display  EKG   Radiology CT CHEST LUNG CANCER SCREENING LOW DOSE WO CONTRAST  Result Date: 09/14/2020 CLINICAL DATA:  Forty-six pack-year smoking history. Current smoker. EXAM: CT CHEST WITHOUT CONTRAST LOW-DOSE FOR LUNG CANCER SCREENING TECHNIQUE: Multidetector CT imaging of the chest was performed  following the standard protocol without IV contrast. COMPARISON:  Chest radiograph 02/08/2010. FINDINGS: Cardiovascular: Aortic atherosclerosis. Normal heart size, without pericardial effusion. Multivessel coronary artery atherosclerosis. Mediastinum/Nodes: No mediastinal or definite hilar adenopathy, given limitations of unenhanced CT. Lungs/Pleura:  No pleural fluid. Mild centrilobular emphysema. Right upper lobe calcified granuloma. Noncalcified pleural-based left lower lobe pulmonary nodule measures volume derived equivalent diameter 4.0 mm. Upper Abdomen: Surgical changes about the gastric body. Normal imaged portions of the liver, spleen, pancreas, gallbladder, adrenal glands, right kidney. Marked left renal atrophy. Suspect dilatation of the infrarenal abdominal aorta, incompletely imaged at 2.6 cm on 71/2. Musculoskeletal: No acute osseous abnormality. IMPRESSION: 1. Lung-RADS 2, benign appearance or behavior. Continue annual screening with low-dose chest CT without contrast in 12 months. 2. Aortic Atherosclerosis (ICD10-I70.0) and Emphysema (ICD10-J43.9). 3. Age advanced coronary artery atherosclerosis. Recommend assessment of coronary risk factors and consideration of medical therapy. 4. Left renal atrophy. 5. Suspicion of dilatation of the infrarenal aorta, incompletely imaged. Consider dedicated abdominal aortic ultrasound. Electronically Signed   By: Abigail Miyamoto M.D.   On: 09/14/2020 16:21    Procedures Procedures (including critical care time)  Medications Ordered in UC Medications - No data to display  Initial Impression / Assessment and Plan / UC Course  I have reviewed the triage vital signs and the nursing notes.  Pertinent labs & imaging results that were available during my care of the patient were reviewed by me and considered in my medical decision making (see chart for details).    65 year old female presents with stye of the left upper eyelid.  Also having bilateral eye  discharge.  Vigamox as prescribed.  Warm compresses.  Supportive care.  Final Clinical Impressions(s) / UC Diagnoses   Final diagnoses:  Hordeolum externum of left upper eyelid  Eye discharge   Discharge Instructions   None    ED Prescriptions    Medication Sig Dispense Auth. Provider   moxifloxacin (VIGAMOX) 0.5 % ophthalmic solution Place 1 drop into both eyes 3 (three) times daily for 7 days. 3 mL Coral Spikes, DO     PDMP not reviewed this encounter.   Coral Spikes, [place] 09/14/20 1902

## 2020-09-20 ENCOUNTER — Encounter: Payer: Self-pay | Admitting: *Deleted

## 2020-09-20 NOTE — Progress Notes (Signed)
Pt had covid for a second time. Offered pt next available appt which is mid to late feb. Pt will hold of on appt at this time.  KP

## 2020-09-20 NOTE — Progress Notes (Signed)
Called pt to as a reminder to complete and mail in cologuard kit. Pt stated she has not received it yet.   KP

## 2020-09-23 ENCOUNTER — Other Ambulatory Visit: Payer: Self-pay | Admitting: Internal Medicine

## 2020-09-23 NOTE — Telephone Encounter (Signed)
Requested medication (s) are due for refill today:   Yes  Requested medication (s) are on the active medication list:   Yes  Future visit scheduled:   Yes   Last ordered: 08/30/2020 #60, 0 refills  Clinic Note:   In Dr. Gaspar Cola notes it's mentioned that her neurologist prescribes this medication.  Wasn't sure whether to refill this under Dr. Army Melia or if it needs to be refilled by her neurologist.      Requested Prescriptions  Pending Prescriptions Disp Refills   gabapentin (NEURONTIN) 100 MG capsule [Pharmacy Med Name: GABAPENTIN 100MG  CAPSULES] 60 capsule 0    Sig: TAKE 2 CAPSULES(200 MG) BY MOUTH IN THE MORNING AND AT BEDTIME [place]      Neurology: Anticonvulsants - gabapentin Passed - 09/23/2020 10:18 AM      Passed - Valid encounter within last 12 months    Recent Outpatient Visits           5 months ago Annual physical exam   [facility] Glean Hess, MD   9 months ago Acute cystitis without hematuria   [facility] Glean Hess, MD   1 year ago Essential hypertension   [facility] Glean Hess, MD   1 year ago Annual physical exam   [facility] Glean Hess, MD   1 year ago Essential hypertension   [facility] Glean Hess, MD       Future Appointments             In 6 months Army Melia Jesse Sans, MD [facility], [facility]

## 2020-09-24 ENCOUNTER — Telehealth: Payer: Self-pay

## 2020-09-24 ENCOUNTER — Other Ambulatory Visit: Payer: Self-pay

## 2020-09-24 DIAGNOSIS — Z136 Encounter for screening for cardiovascular disorders: Secondary | ICD-10-CM

## 2020-09-24 DIAGNOSIS — R918 Other nonspecific abnormal finding of lung field: Secondary | ICD-10-CM

## 2020-09-24 NOTE — Telephone Encounter (Signed)
Spoke to pt let her know that her CT scan showed a possible abdominal aortic aneurysm. Told pt that she would need to have a thorough abdominal ultrasound. Pt verbalized understanding.   KP

## 2020-09-24 NOTE — Telephone Encounter (Signed)
-----   Message from Glean Hess, MD sent at 09/20/2020  1:36 PM EST ----- Her CT scan showed a possible abd aortic aneurysm.  She needs to have a dedicated Abd ultrasound.  Please call her to discuss. -Dr. Army Melia ----- Message ----- From: Lieutenant Diego, RN Sent: 09/20/2020   1:19 PM EST To: Glean Hess, MD  Dr. Army Melia, please see the recommendation regarding dilation of infrarenal aorta.  We will send interpretation of these results to the patient. We will also contact them close to the time their annual lung screening scan is due next year for scheduling.  Thank you, Raquel Sarna

## 2020-09-25 ENCOUNTER — Ambulatory Visit
Admission: RE | Admit: 2020-09-25 | Discharge: 2020-09-25 | Disposition: A | Discharge status: Home / Self Care | Payer: Medicare HMO | Source: Ambulatory Visit | Attending: Internal Medicine | Admitting: Internal Medicine

## 2020-09-25 ENCOUNTER — Other Ambulatory Visit: Payer: Self-pay

## 2020-09-25 DIAGNOSIS — Z9049 Acquired absence of other specified parts of digestive tract: Secondary | ICD-10-CM | POA: Diagnosis not present

## 2020-09-25 DIAGNOSIS — Z136 Encounter for screening for cardiovascular disorders: Secondary | ICD-10-CM | POA: Diagnosis not present

## 2020-09-25 DIAGNOSIS — R918 Other nonspecific abnormal finding of lung field: Secondary | ICD-10-CM | POA: Diagnosis not present

## 2020-09-27 ENCOUNTER — Telehealth: Payer: Self-pay

## 2020-09-27 ENCOUNTER — Encounter: Payer: Self-pay | Admitting: Internal Medicine

## 2020-09-27 DIAGNOSIS — I714 Abdominal aortic aneurysm, without rupture, unspecified: Secondary | ICD-10-CM | POA: Insufficient documentation

## 2020-09-27 DIAGNOSIS — N261 Atrophy of kidney (terminal): Secondary | ICD-10-CM | POA: Insufficient documentation

## 2020-09-27 NOTE — Telephone Encounter (Signed)
Called pt with labs result. Pt verbalized understanding,  KP

## 2020-09-27 NOTE — Telephone Encounter (Signed)
Pt would like a call about her ultrasound that she had done Tuesday. Please advise

## 2020-10-29 ENCOUNTER — Other Ambulatory Visit: Payer: Self-pay

## 2020-10-29 ENCOUNTER — Ambulatory Visit (INDEPENDENT_AMBULATORY_CARE_PROVIDER_SITE_OTHER): Payer: Medicare HMO | Admitting: Internal Medicine

## 2020-10-29 ENCOUNTER — Encounter: Payer: Self-pay | Admitting: Internal Medicine

## 2020-10-29 VITALS — BP 116/64 | HR 92 | Ht 61.0 in | Wt 137.0 lb

## 2020-10-29 DIAGNOSIS — Z23 Encounter for immunization: Secondary | ICD-10-CM

## 2020-10-29 DIAGNOSIS — I10 Essential (primary) hypertension: Secondary | ICD-10-CM | POA: Diagnosis not present

## 2020-10-29 DIAGNOSIS — I471 Supraventricular tachycardia: Secondary | ICD-10-CM

## 2020-10-29 DIAGNOSIS — I714 Abdominal aortic aneurysm, without rupture, unspecified: Secondary | ICD-10-CM

## 2020-10-29 DIAGNOSIS — E782 Mixed hyperlipidemia: Secondary | ICD-10-CM | POA: Diagnosis not present

## 2020-10-29 DIAGNOSIS — G250 Essential tremor: Secondary | ICD-10-CM

## 2020-10-29 DIAGNOSIS — F418 Other specified anxiety disorders: Secondary | ICD-10-CM

## 2020-10-29 MED ORDER — ESCITALOPRAM OXALATE 10 MG PO TABS: 10.0000 mg | ORAL_TABLET | Freq: Every day | ORAL | 1 refills | Status: DC

## 2020-10-29 MED ORDER — LISINOPRIL-HYDROCHLOROTHIAZIDE 20-25 MG PO TABS
1.0000 | ORAL_TABLET | Freq: Every day | ORAL | 1 refills | Status: DC
Start: 2020-10-29 — End: 2021-04-03

## 2020-10-29 MED ORDER — ROSUVASTATIN CALCIUM 40 MG PO TABS: 40.0000 mg | ORAL_TABLET | Freq: Every day | ORAL | 1 refills | Status: DC

## 2020-10-29 MED ORDER — GABAPENTIN 100 MG PO CAPS: ORAL_CAPSULE | ORAL | 1 refills | Status: DC

## 2020-10-29 NOTE — Progress Notes (Signed)
Date:  10/29/2020   Name:  Tiffany Bonilla   DOB:  15-Aug-1956   MRN:  [id number]   Chief Complaint: Hypertension (Follow up.), essential tremors (Needs refill for gabapentin. Take BID.), and Immunizations (Prevnar 13. Flu vaccine- high dose. )  Hypertension This is a chronic problem. The problem is controlled. Pertinent negatives include no chest pain, headaches, palpitations or shortness of breath. Past treatments include ACE inhibitors and diuretics. The current treatment provides significant improvement. Hypertensive end-organ damage includes CAD/MI.   Tremor - seen by Neurology and diagnosed at [facility].  Gabapentin twice a day is helpful.  Lab Results  Component Value Date   CREATININE 1.16 (H) 04/09/2020   BUN 20 04/09/2020   NA 140 04/09/2020   K 3.8 04/09/2020   CL 99 04/09/2020   CO2 26 04/09/2020   Lab Results  Component Value Date   CHOL 152 04/09/2020   HDL 64 04/09/2020   LDLCALC 74 04/09/2020   TRIG 74 04/09/2020   CHOLHDL 2.4 04/09/2020   Lab Results  Component Value Date   TSH 1.300 04/09/2020   Lab Results  Component Value Date   HGBA1C 5.5 02/16/2019   Lab Results  Component Value Date   WBC 9.3 04/09/2020   HGB 15.7 04/09/2020   HCT 47.7 (H) 04/09/2020   MCV 95 04/09/2020   PLT 202 04/09/2020   Lab Results  Component Value Date   ALT 21 04/09/2020   AST 26 04/09/2020   ALKPHOS 46 (L) 04/09/2020   BILITOT 0.4 04/09/2020     Review of Systems  Constitutional: Negative for appetite change, fatigue, fever and unexpected weight change.  HENT: Negative for tinnitus and trouble swallowing.   Eyes: Negative for visual disturbance.  Respiratory: Negative for cough, chest tightness and shortness of breath.   Cardiovascular: Negative for chest pain, palpitations and leg swelling.  Gastrointestinal: Negative for abdominal pain.  Endocrine: Negative for polydipsia and polyuria.  Genitourinary: Negative for dysuria and hematuria.   Musculoskeletal: Negative for arthralgias.  Neurological: Negative for tremors, numbness and headaches.  Psychiatric/Behavioral: Negative for dysphoric mood.    Patient Active Problem List   Diagnosis Date Noted  . Atrophic kidney 09/27/2020  . Aortic aneurysm, abdominal ([place]) 09/27/2020  . Benign essential tremor 04/09/2020  . Tremor 02/01/2020  . Vitamin D deficiency 09/06/2019  . Other specified anxiety disorders 02/16/2019  . Lichen sclerosus et atrophicus of the vulva 11/25/2018  . Essential hypertension 11/24/2018  . SVT (supraventricular tachycardia) ([place]) 11/24/2018  . Old lacunar stroke without late effect 11/24/2018  . Current every day smoker 10/25/2015  . Hx of hepatitis C 10/25/2015  . CAD in native artery 05/24/2015  . Obstructive sleep apnea 03/18/2015  . GERD (gastroesophageal reflux disease) 06/12/2011  . Mixed hyperlipidemia 06/12/2011    Allergies  Allergen Reactions  . Morphine Hives and Shortness Of Breath    Stopped breathing   . Scopolamine Other (See Comments)    Unconscious   . Codeine Nausea Only and Other (See Comments)    NAUSEA/VOMITING   . Sulfa Antibiotics Hives and Nausea Only    Other Reaction: Not Assessed     Past Surgical History:  Procedure Laterality Date  . CARDIAC ELECTROPHYSIOLOGY STUDY AND ABLATION  2006   for SVT  . COLONOSCOPY  2006  . CORONARY ANGIOPLASTY WITH STENT PLACEMENT  2001   to Circumfx  . ESOPHAGOGASTRODUODENOSCOPY  2017  . LAPAROSCOPIC CHOLECYSTECTOMY  04/25/2019   @ [facility]  . LAPAROSCOPIC GASTRIC  SLEEVE RESECTION  11/07/2015  . TOTAL ABDOMINAL HYSTERECTOMY  1992   CIS cervix    Social History   Tobacco Use  . Smoking status: Current Every Day Smoker    Packs/day: 0.50    Years: 40.00    Pack years: 20.00    Types: Cigarettes    Start date: 84  . Smokeless tobacco: Never Used  Vaping Use  . Vaping Use: Never used  Substance Use Topics  . Alcohol use: Yes    Comment: rare  . Drug use: Never      Medication list has been reviewed and updated.  Current Meds  Medication Sig  . aspirin EC 81 MG tablet Take 1 tablet by mouth daily.  . Black Elderberry 50 MG/5ML SYRP Take by mouth.  . cholecalciferol (VITAMIN D3) 25 MCG (1000 UNIT) tablet Take 2,000 Units by mouth daily.  . clobetasol cream (TEMOVATE) 0.05 % Apply topically 2 (two) times daily. to affected area  . escitalopram (LEXAPRO) 10 MG tablet Take 1 tablet (10 mg total) by mouth daily.  Marland Kitchen gabapentin (NEURONTIN) 100 MG capsule TAKE 2 CAPSULES(200 MG) BY MOUTH IN THE MORNING AND AT BEDTIME FOR TREMORS  . lisinopril-hydrochlorothiazide (ZESTORETIC) 20-25 MG tablet Take 1 tablet by mouth daily.  . Melatonin 10 MG TABS Take 1 tablet by mouth at bedtime.  . Multiple Vitamins-Minerals (ALIVE WOMENS ENERGY) TABS Take by mouth.  . rosuvastatin (CRESTOR) 40 MG tablet Take 1 tablet (40 mg total) by mouth daily.    PHQ 2/9 Scores 10/29/2020 08/20/2020 04/09/2020 12/23/2019  PHQ - 2 Score 0 0 0 0  PHQ- 9 Score 0 - 0 0    GAD 7 : Generalized Anxiety Score 10/29/2020 12/23/2019 02/16/2019  Nervous, Anxious, on Edge 0 0 3  Control/stop worrying 0 0 1  Worry too much - different things 0 0 1  Trouble relaxing 0 0 1  Restless 0 0 1  Easily annoyed or irritable 0 0 2  Afraid - awful might happen 0 0 3  Total GAD 7 Score 0 0 12  Anxiety Difficulty Not difficult at all Not difficult at all Very difficult    BP Readings from Last 3 Encounters:  10/29/20 116/64  09/14/20 128/67  04/09/20 112/70    Physical Exam Vitals and nursing note reviewed.  Constitutional:      General: She is not in acute distress.    Appearance: She is well-developed.  HENT:     Head: Normocephalic and atraumatic.  Neck:     Vascular: No carotid bruit.  Cardiovascular:     Rate and Rhythm: Normal rate and regular rhythm.  No extrasystoles are present.    Pulses: Normal pulses.          Radial pulses are 2+ on the right side and 2+ on the left side.        Dorsalis pedis pulses are 2+ on the right side and 2+ on the left side.     Heart sounds: Normal heart sounds. No murmur heard.   Pulmonary:     Effort: Pulmonary effort is normal. No respiratory distress.     Breath sounds: No wheezing or rhonchi.  Musculoskeletal:     Cervical back: Normal range of motion.     Right lower leg: No edema.     Left lower leg: No edema.  Lymphadenopathy:     Cervical: No cervical adenopathy.  Skin:    General: Skin is warm and dry.     Findings:  No rash.  Neurological:     General: No focal deficit present.     Mental Status: She is alert and oriented to person, place, and time.  Psychiatric:        Attention and Perception: Attention normal.        Mood and Affect: Mood normal.        Behavior: Behavior normal.     Wt Readings from Last 3 Encounters:  10/29/20 137 lb (62.1 kg)  09/14/20 130 lb (59 kg)  09/14/20 130 lb (59 kg)    BP 116/64   Pulse 92   Ht 5\' 1"  (1.549 m)   Wt 137 lb (62.1 kg)   SpO2 94%   BMI 25.89 kg/m   Assessment and Plan: 1. Essential hypertension Clinically stable exam with well controlled BP. Tolerating medications without side effects at this time. Pt to continue current regimen and low sodium diet; benefits of regular exercise as able discussed. - lisinopril-hydrochlorothiazide (ZESTORETIC) 20-25 MG tablet; Take 1 tablet by mouth daily.  Dispense: 90 tablet; Refill: 1  2. Abdominal aortic aneurysm (AAA) without rupture ([place]) Continue to work on quitting smoking Continue statin therapy  3. SVT (supraventricular tachycardia) (H[facility]) Resolved since ablation  4. Benign essential tremor Doing well on gabapentin - gabapentin (NEURONTIN) 100 MG capsule; TAKE 2 CAPSULES(200 MG) BY MOUTH IN THE MORNING AND AT BEDTIME FOR TREMORS  Dispense: 360 capsule; Refill: 1  5. Need for vaccination for pneumococcus Prevnar-13 today Prevnar-20 next year?  6. Other specified anxiety disorders Clinically stable on current  regimen with good control of symptoms, No SI or HI. Will continue current therapy. - escitalopram (LEXAPRO) 10 MG tablet; Take 1 tablet (10 mg total) by mouth daily.  Dispense: 90 tablet; Refill: 1  7. Mixed hyperlipidemia Tolerating higher intensity statin therapy - rosuvastatin (CRESTOR) 40 MG tablet; Take 1 tablet (40 mg total) by mouth daily.  Dispense: 90 tablet; Refill: 1  8. Need for immunization against influenza - Flu Vaccine QUAD High Dose(Fluad)   Partially dictated using Editor, commissioning. Any errors are unintentional.  Halina Maidens, MD [facility]  10/29/2020

## 2020-11-21 DIAGNOSIS — H2513 Age-related nuclear cataract, bilateral: Secondary | ICD-10-CM | POA: Diagnosis not present

## 2020-11-21 DIAGNOSIS — Z0100 Encounter for examination of eyes and vision without abnormal findings: Secondary | ICD-10-CM | POA: Diagnosis not present

## 2020-11-27 DIAGNOSIS — H2513 Age-related nuclear cataract, bilateral: Secondary | ICD-10-CM | POA: Diagnosis not present

## 2020-12-06 DIAGNOSIS — H43813 Vitreous degeneration, bilateral: Secondary | ICD-10-CM | POA: Diagnosis not present

## 2020-12-06 DIAGNOSIS — H2513 Age-related nuclear cataract, bilateral: Secondary | ICD-10-CM | POA: Diagnosis not present

## 2021-01-23 DIAGNOSIS — H2511 Age-related nuclear cataract, right eye: Secondary | ICD-10-CM | POA: Diagnosis not present

## 2021-01-24 ENCOUNTER — Encounter: Payer: Self-pay | Admitting: Ophthalmology

## 2021-02-05 NOTE — Discharge Instructions (Signed)

## 2021-02-06 ENCOUNTER — Other Ambulatory Visit: Payer: Self-pay

## 2021-02-06 ENCOUNTER — Encounter: Payer: Self-pay | Admitting: Ophthalmology

## 2021-02-06 ENCOUNTER — Ambulatory Visit: Payer: Medicare HMO | Admitting: Anesthesiology

## 2021-02-06 ENCOUNTER — Ambulatory Visit
Admission: RE | Admit: 2021-02-06 | Discharge: 2021-02-06 | Disposition: A | Discharge status: Home / Self Care | Payer: Medicare HMO | Attending: Ophthalmology | Admitting: Ophthalmology

## 2021-02-06 ENCOUNTER — Encounter
Admission: RE | Disposition: A | Discharge status: Home / Self Care | Payer: Self-pay | Source: Home / Self Care | Attending: Ophthalmology

## 2021-02-06 DIAGNOSIS — F17210 Nicotine dependence, cigarettes, uncomplicated: Secondary | ICD-10-CM | POA: Insufficient documentation

## 2021-02-06 DIAGNOSIS — I10 Essential (primary) hypertension: Secondary | ICD-10-CM | POA: Insufficient documentation

## 2021-02-06 DIAGNOSIS — Z8673 Personal history of transient ischemic attack (TIA), and cerebral infarction without residual deficits: Secondary | ICD-10-CM | POA: Diagnosis not present

## 2021-02-06 DIAGNOSIS — Z885 Allergy status to narcotic agent status: Secondary | ICD-10-CM | POA: Insufficient documentation

## 2021-02-06 DIAGNOSIS — H25811 Combined forms of age-related cataract, right eye: Secondary | ICD-10-CM | POA: Diagnosis not present

## 2021-02-06 DIAGNOSIS — Z888 Allergy status to other drugs, medicaments and biological substances status: Secondary | ICD-10-CM | POA: Diagnosis not present

## 2021-02-06 DIAGNOSIS — Q760 Spina bifida occulta: Secondary | ICD-10-CM | POA: Insufficient documentation

## 2021-02-06 DIAGNOSIS — H2511 Age-related nuclear cataract, right eye: Secondary | ICD-10-CM | POA: Insufficient documentation

## 2021-02-06 DIAGNOSIS — Z955 Presence of coronary angioplasty implant and graft: Secondary | ICD-10-CM | POA: Diagnosis not present

## 2021-02-06 DIAGNOSIS — Z7982 Long term (current) use of aspirin: Secondary | ICD-10-CM | POA: Insufficient documentation

## 2021-02-06 HISTORY — DX: Presence of dental prosthetic device (complete) (partial): Z97.2

## 2021-02-06 HISTORY — PX: CATARACT EXTRACTION W/PHACO: SHX586

## 2021-02-06 HISTORY — DX: Spina bifida occulta: Q76.0

## 2021-02-06 SURGERY — PHACOEMULSIFICATION, CATARACT, WITH IOL INSERTION
Anesthesia: Monitor Anesthesia Care | Site: Eye | Laterality: Right

## 2021-02-06 MED ORDER — LACTATED RINGERS IV SOLN: INTRAVENOUS | Status: DC

## 2021-02-06 MED ORDER — ARMC OPHTHALMIC DILATING DROPS
1.0000 "application " | OPHTHALMIC | Status: DC | PRN
  Administered 2021-02-06 (×3): 1 via OPHTHALMIC

## 2021-02-06 MED ORDER — BRIMONIDINE TARTRATE-TIMOLOL 0.2-0.5 % OP SOLN
OPHTHALMIC | Status: DC | PRN
  Administered 2021-02-06: 1 [drp] via OPHTHALMIC

## 2021-02-06 MED ORDER — CEFUROXIME OPHTHALMIC INJECTION 1 MG/0.1 ML
INJECTION | OPHTHALMIC | Status: DC | PRN
  Administered 2021-02-06: 0.1 mL via INTRACAMERAL

## 2021-02-06 MED ORDER — FENTANYL CITRATE (PF) 100 MCG/2ML IJ SOLN
INTRAMUSCULAR | Status: DC | PRN
  Administered 2021-02-06: 50 ug via INTRAVENOUS

## 2021-02-06 MED ORDER — EPINEPHRINE PF 1 MG/ML IJ SOLN
INTRAOCULAR | Status: DC | PRN
  Administered 2021-02-06: 36 mL via OPHTHALMIC

## 2021-02-06 MED ORDER — MIDAZOLAM HCL 2 MG/2ML IJ SOLN
INTRAMUSCULAR | Status: DC | PRN
  Administered 2021-02-06 (×2): 1 mg via INTRAVENOUS

## 2021-02-06 MED ORDER — TETRACAINE HCL 0.5 % OP SOLN
1.0000 [drp] | OPHTHALMIC | Status: DC | PRN
  Administered 2021-02-06 (×3): 1 [drp] via OPHTHALMIC

## 2021-02-06 MED ORDER — LIDOCAINE HCL (PF) 2 % IJ SOLN
INTRAOCULAR | Status: DC | PRN
  Administered 2021-02-06: 1 mL

## 2021-02-06 MED ORDER — NA HYALUR & NA CHOND-NA HYALUR 0.4-0.35 ML IO KIT
PACK | INTRAOCULAR | Status: DC | PRN
  Administered 2021-02-06: 1 mL via INTRAOCULAR

## 2021-02-06 SURGICAL SUPPLY — 24 items
CANNULA ANT/CHMB 27GA (MISCELLANEOUS) ×2 IMPLANT
GLOVE SURG ENC TEXT LTX SZ7.5 (GLOVE) ×2 IMPLANT
GLOVE SURG TRIUMPH 8.0 PF LTX (GLOVE) ×4 IMPLANT
GOWN STRL REUS W/ TWL LRG LVL3 (GOWN DISPOSABLE) ×2 IMPLANT
GOWN STRL REUS W/TWL LRG LVL3 (GOWN DISPOSABLE) ×4
LENS IOL TECNIS EYHANCE 15.0 (Intraocular Lens) ×2 IMPLANT
MARKER SKIN DUAL TIP RULER LAB (MISCELLANEOUS) ×2 IMPLANT
NDL RETROBULBAR .5 NSTRL (NEEDLE) IMPLANT
NEEDLE CAPSULORHEX 25GA (NEEDLE) ×2 IMPLANT
NEEDLE FILTER BLUNT 18X 1/2SAF (NEEDLE) ×2
NEEDLE FILTER BLUNT 18X1 1/2 (NEEDLE) ×2 IMPLANT
PACK CATARACT BRASINGTON (MISCELLANEOUS) ×2 IMPLANT
PACK EYE AFTER SURG (MISCELLANEOUS) ×2 IMPLANT
PACK OPTHALMIC (MISCELLANEOUS) ×2 IMPLANT
RING MALYGIN 7.0 (MISCELLANEOUS) IMPLANT
SOLUTION OPHTHALMIC SALT (MISCELLANEOUS) ×2 IMPLANT
SUT ETHILON 10-0 CS-B-6CS-B-6 (SUTURE)
SUT VICRYL  9 0 (SUTURE)
SUT VICRYL 9 0 (SUTURE) IMPLANT
SUTURE EHLN 10-0 CS-B-6CS-B-6 (SUTURE) IMPLANT
SYR 3ML LL SCALE MARK (SYRINGE) ×4 IMPLANT
SYR TB 1ML LUER SLIP (SYRINGE) ×2 IMPLANT
WATER STERILE IRR 250ML POUR (IV SOLUTION) ×2 IMPLANT
WIPE NON LINTING 3.25X3.25 (MISCELLANEOUS) ×2 IMPLANT

## 2021-02-06 NOTE — H&P (Signed)
[facility]   Primary Care Physician:  Glean Hess, MD Ophthalmologist: Dr. Leandrew Koyanagi  Pre-Procedure History & Physical: HPI:  Tiffany Bonilla is a 65 y.o. female here for ophthalmic surgery.   Past Medical History:  Diagnosis Date  . Allergy   . Asthma   . Hyperlipidemia   . Hypertension   . Pre-diabetes 11/24/2018  . Spina bifida occulta   . Stroke ([place])   . SVT (supraventricular tachycardia) ([place]) 11/24/2018   S/p ablation at [facility]  . Wears dentures    full upper    Past Surgical History:  Procedure Laterality Date  . CARDIAC ELECTROPHYSIOLOGY STUDY AND ABLATION  2006   for SVT  . COLONOSCOPY  2006  . CORONARY ANGIOPLASTY WITH STENT PLACEMENT  2001   to Circumfx  . ESOPHAGOGASTRODUODENOSCOPY  2017  . LAPAROSCOPIC CHOLECYSTECTOMY  04/25/2019   @ [facility]  . LAPAROSCOPIC GASTRIC SLEEVE RESECTION  11/07/2015  . TOTAL ABDOMINAL HYSTERECTOMY  1992   CIS cervix    Prior to Admission medications   Medication Sig Start Date End Date Taking? Authorizing Provider  aspirin EC 81 MG tablet Take 1 tablet by mouth daily.   Yes [provider]  Black Elderberry 50 MG/5ML SYRP Take by mouth.   Yes [provider]  cholecalciferol (VITAMIN D3) 25 MCG (1000 UNIT) tablet Take 2,000 Units by mouth daily.   Yes [provider]  clobetasol cream (TEMOVATE) 0.05 % Apply topically 2 (two) times daily. to affected area 06/15/20  Yes Glean Hess, MD  escitalopram (LEXAPRO) 10 MG tablet Take 1 tablet (10 mg total) by mouth daily. 10/29/20  Yes Glean Hess, MD  gabapentin (NEURONTIN) 100 MG capsule TAKE 2 CAPSULES(200 MG) BY MOUTH IN THE MORNING AND AT BEDTIME FOR TREMORS 10/29/20  Yes Glean Hess, MD  lisinopril-hydrochlorothiazide (ZESTORETIC) 20-25 MG tablet Take 1 tablet by mouth daily. 10/29/20  Yes Glean Hess, MD  Melatonin 10 MG TABS Take 1 tablet by mouth at bedtime.   Yes [provider]  Multiple  Vitamins-Minerals (ALIVE WOMENS ENERGY) TABS Take by mouth.   Yes [provider]  rosuvastatin (CRESTOR) 40 MG tablet Take 1 tablet (40 mg total) by mouth daily. 10/29/20  Yes Glean Hess, MD    Allergies as of 12/12/2020 - Review Complete 10/29/2020  Allergen Reaction Noted  . Morphine Hives and Shortness Of Breath 09/02/2015  . Scopolamine Other (See Comments) 11/25/2018  . Codeine Nausea Only and Other (See Comments) 09/02/2015  . Sulfa antibiotics Hives and Nausea Only 09/02/2015    Family History  Problem Relation Age of Onset  . Aneurysm Mother     Social History   Socioeconomic History  . Marital status: Widowed    Spouse name: Not on file  . Number of children: 5  . Years of education: Not on file  . Highest education level: Not on file  Occupational History  . Not on file  Tobacco Use  . Smoking status: Current Every Day Smoker    Packs/day: 0.50    Years: 40.00    Pack years: 20.00    Types: Cigarettes    Start date: 50  . Smokeless tobacco: Never Used  Vaping Use  . Vaping Use: Never used  Substance and Sexual Activity  . Alcohol use: Yes    Comment: rare  . Drug use: Never  . Sexual activity: Not Currently  Other Topics Concern  . Not on file  Social History  Narrative   Pt lives with 2 of her daughters and 2 grandchildren   Social Determinants of Health   Financial Resource Strain: Low Risk   . Difficulty of Paying Living Expenses: Not hard at all  Food Insecurity: No Food Insecurity  . Worried About Charity fundraiser in the Last Year: Never true  . Ran Out of Food in the Last Year: Never true  Transportation Needs: No Transportation Needs  . Lack of Transportation (Medical): No  . Lack of Transportation (Non-Medical): No  Physical Activity: Inactive  . Days of Exercise per Week: 0 days  . Minutes of Exercise per Session: 0 min  Stress: No Stress Concern Present  . Feeling of Stress : Not at all  Social Connections: Socially  Isolated  . Frequency of Communication with Friends and Family: More than three times a week  . Frequency of Social Gatherings with Friends and Family: More than three times a week  . Attends Religious Services: Never  . Active Member of Clubs or Organizations: No  . Attends Archivist Meetings: Never  . Marital Status: Widowed  Intimate Partner Violence: Not At Risk  . Fear of Current or Ex-Partner: No  . Emotionally Abused: No  . Physically Abused: No  . Sexually Abused: No    Review of Systems: See HPI, otherwise negative ROS  Physical Exam: BP 118/66   Pulse 67   Temp 97.7 F (36.5 C) (Temporal)   Resp 18   Ht 5\' 2"  (1.575 m)   Wt 61.2 kg   SpO2 97%   BMI 24.69 kg/m  General:   Alert,  pleasant and cooperative in NAD Head:  Normocephalic and atraumatic. Lungs:  Clear to auscultation.    Heart:  Regular rate and rhythm.   Impression/Plan: Tiffany Bonilla is here for ophthalmic surgery.  Risks, benefits, limitations, and alternatives regarding ophthalmic surgery have been reviewed with the patient.  Questions have been answered.  All parties agreeable.   Leandrew Koyanagi, MD  02/06/2021, 11:26 AM

## 2021-02-06 NOTE — Anesthesia Preprocedure Evaluation (Signed)
Anesthesia Evaluation  Patient identified by MRN, date of birth, ID band Patient awake    Reviewed: NPO status   History of Anesthesia Complications Negative for: history of anesthetic complications  Airway Mallampati: II  TM Distance: >3 FB Neck ROM: full    Dental  (+) Upper Dentures   Pulmonary asthma (mild) , COPD (mild), Current SmokerPatient did not abstain from smoking.,    Pulmonary exam normal        Cardiovascular Exercise Tolerance: Good hypertension, + CAD and + Cardiac Stents (age 65)  Normal cardiovascular exam+ dysrhythmias (svt ablated (age 86))   AAA 4 cm (stable);   Neuro/Psych Anxiety Depression Spina bifida occulta;  essential tremor; CVA (?Old lacunar strok)    GI/Hepatic negative GI ROS, (+) Hepatitis - (hep C resolved) s/p gastric sleeve;        Endo/Other  negative endocrine ROS  Renal/GU negative Renal ROS  negative genitourinary   Musculoskeletal   Abdominal   Peds  Hematology negative hematology ROS (+)   Anesthesia Other Findings pcp:  Glean Hess, MD at 10/29/2020;  Last aspirin: 6/8;   Reproductive/Obstetrics                             Anesthesia Physical Anesthesia Plan  ASA: III  Anesthesia Plan: MAC   Post-op Pain Management:    Induction:   PONV Risk Score and Plan: 2 and Midazolam and TIVA  Airway Management Planned:   Additional Equipment:   Intra-op Plan:   Post-operative Plan:   Informed Consent: I have reviewed the patients History and Physical, chart, labs and discussed the procedure including the risks, benefits and alternatives for the proposed anesthesia with the patient or authorized representative who has indicated his/her understanding and acceptance.       Plan Discussed with: CRNA  Anesthesia Plan Comments:         Anesthesia Quick Evaluation

## 2021-02-06 NOTE — Transfer of Care (Signed)
Immediate Anesthesia Transfer of Care Note  Patient: Tiffany Bonilla  Procedure(s) Performed: CATARACT EXTRACTION PHACO AND INTRAOCULAR LENS PLACEMENT (IOC) RIGHT (Right Eye)  Patient Location: PACU  Anesthesia Type: MAC  Level of Consciousness: awake, alert  and patient cooperative  Airway and Oxygen Therapy: Patient Spontanous Breathing and Patient connected to supplemental oxygen  Post-op Assessment: Post-op Vital signs reviewed, Patient's Cardiovascular Status Stable, Respiratory Function Stable, Patent Airway and No signs of Nausea or vomiting  Post-op Vital Signs: Reviewed and stable  Complications: No complications documented.

## 2021-02-06 NOTE — Anesthesia Postprocedure Evaluation (Signed)
Anesthesia Post Note  Patient: Tiffany Bonilla  Procedure(s) Performed: CATARACT EXTRACTION PHACO AND INTRAOCULAR LENS PLACEMENT (IOC) RIGHT (Right Eye)     Patient location during evaluation: PACU Anesthesia Type: MAC Level of consciousness: awake and alert Pain management: pain level controlled Vital Signs Assessment: post-procedure vital signs reviewed and stable Respiratory status: spontaneous breathing, nonlabored ventilation, respiratory function stable and patient connected to nasal cannula oxygen Cardiovascular status: stable and blood pressure returned to baseline Postop Assessment: no apparent nausea or vomiting Anesthetic complications: no   No complications documented.  Fidel Levy

## 2021-02-06 NOTE — Op Note (Signed)
  LOCATION:  [place]   PREOPERATIVE DIAGNOSIS:    Nuclear sclerotic cataract right eye. H25.11   POSTOPERATIVE DIAGNOSIS:  Nuclear sclerotic cataract right eye.     PROCEDURE:  Phacoemusification with posterior chamber intraocular lens placement of the right eye   ULTRASOUND TIME: Procedure(s) with comments: CATARACT EXTRACTION PHACO AND INTRAOCULAR LENS PLACEMENT (IOC) RIGHT (Right) - 2.85 0:32.6 8.7%  LENS:   Implant Name Type Inv. Item Serial No. Manufacturer Lot No. LRB No. Used Action  LENS IOL TECNIS EYHANCE 15.0 - [id number] Intraocular Lens LENS IOL TECNIS EYHANCE 15.0 [id number] JOHNSON   Right 1 Implanted         SURGEON:  Wyonia Hough, MD   ANESTHESIA:  Topical with tetracaine drops and 2% Xylocaine jelly, augmented with 1% preservative-free intracameral lidocaine.    COMPLICATIONS:  None.   DESCRIPTION OF PROCEDURE:  The patient was identified in the holding room and transported to the operating room and placed in the supine position under the operating microscope.  The right eye was identified as the operative eye and it was prepped and draped in the usual sterile ophthalmic fashion.   A 1 millimeter clear-corneal paracentesis was made at the 12:00 position.  0.5 ml of preservative-free 1% lidocaine was injected into the anterior chamber. The anterior chamber was filled with Viscoat viscoelastic.  A 2.4 millimeter keratome was used to make a near-clear corneal incision at the 9:00 position.  A curvilinear capsulorrhexis was made with a cystotome and capsulorrhexis forceps.  Balanced salt solution was used to hydrodissect and hydrodelineate the nucleus.   Phacoemulsification was then used in stop and chop fashion to remove the lens nucleus and epinucleus.  The remaining cortex was then removed using the irrigation and aspiration handpiece. Provisc was then placed into the capsular bag to distend it for lens placement.  A lens was then injected into  the capsular bag.  The remaining viscoelastic was aspirated.   Wounds were hydrated with balanced salt solution.  The anterior chamber was inflated to a physiologic pressure with balanced salt solution.  No wound leaks were noted. Cefuroxime 0.1 ml of a 10mg /ml solution was injected into the anterior chamber for a dose of 1 mg of intracameral antibiotic at the completion of the case.   Timolol and Brimonidine drops were applied to the eye.  The patient was taken to the recovery room in stable condition without complications of anesthesia or surgery.   Finnigan Warriner 02/06/2021, 12:39 PM

## 2021-02-07 ENCOUNTER — Encounter: Payer: Self-pay | Admitting: Ophthalmology

## 2021-02-07 ENCOUNTER — Other Ambulatory Visit: Payer: Self-pay

## 2021-02-11 DIAGNOSIS — H2512 Age-related nuclear cataract, left eye: Secondary | ICD-10-CM | POA: Diagnosis not present

## 2021-02-20 ENCOUNTER — Ambulatory Visit: Payer: Medicare HMO | Admitting: Anesthesiology

## 2021-02-20 ENCOUNTER — Ambulatory Visit
Admission: RE | Admit: 2021-02-20 | Discharge: 2021-02-20 | Disposition: A | Discharge status: Home / Self Care | Payer: Medicare HMO | Attending: Ophthalmology | Admitting: Ophthalmology

## 2021-02-20 ENCOUNTER — Encounter
Admission: RE | Disposition: A | Discharge status: Home / Self Care | Payer: Self-pay | Source: Home / Self Care | Attending: Ophthalmology

## 2021-02-20 ENCOUNTER — Other Ambulatory Visit: Payer: Self-pay

## 2021-02-20 ENCOUNTER — Encounter: Payer: Self-pay | Admitting: Ophthalmology

## 2021-02-20 DIAGNOSIS — H2512 Age-related nuclear cataract, left eye: Secondary | ICD-10-CM | POA: Diagnosis not present

## 2021-02-20 DIAGNOSIS — H25812 Combined forms of age-related cataract, left eye: Secondary | ICD-10-CM | POA: Diagnosis not present

## 2021-02-20 DIAGNOSIS — Z9841 Cataract extraction status, right eye: Secondary | ICD-10-CM | POA: Insufficient documentation

## 2021-02-20 DIAGNOSIS — Z882 Allergy status to sulfonamides status: Secondary | ICD-10-CM | POA: Diagnosis not present

## 2021-02-20 DIAGNOSIS — Z888 Allergy status to other drugs, medicaments and biological substances status: Secondary | ICD-10-CM | POA: Diagnosis not present

## 2021-02-20 DIAGNOSIS — Z961 Presence of intraocular lens: Secondary | ICD-10-CM | POA: Insufficient documentation

## 2021-02-20 DIAGNOSIS — F17210 Nicotine dependence, cigarettes, uncomplicated: Secondary | ICD-10-CM | POA: Diagnosis not present

## 2021-02-20 DIAGNOSIS — Z79899 Other long term (current) drug therapy: Secondary | ICD-10-CM | POA: Diagnosis not present

## 2021-02-20 DIAGNOSIS — Z885 Allergy status to narcotic agent status: Secondary | ICD-10-CM | POA: Insufficient documentation

## 2021-02-20 DIAGNOSIS — Z955 Presence of coronary angioplasty implant and graft: Secondary | ICD-10-CM | POA: Insufficient documentation

## 2021-02-20 DIAGNOSIS — Z7982 Long term (current) use of aspirin: Secondary | ICD-10-CM | POA: Insufficient documentation

## 2021-02-20 HISTORY — PX: CATARACT EXTRACTION W/PHACO: SHX586

## 2021-02-20 SURGERY — PHACOEMULSIFICATION, CATARACT, WITH IOL INSERTION
Anesthesia: Monitor Anesthesia Care | Site: Eye | Laterality: Left

## 2021-02-20 MED ORDER — NA HYALUR & NA CHOND-NA HYALUR 0.4-0.35 ML IO KIT
PACK | INTRAOCULAR | Status: DC | PRN
  Administered 2021-02-20: 1 mL via INTRAOCULAR

## 2021-02-20 MED ORDER — EPINEPHRINE PF 1 MG/ML IJ SOLN
INTRAOCULAR | Status: DC | PRN
  Administered 2021-02-20: 61 mL via OPHTHALMIC

## 2021-02-20 MED ORDER — CEFUROXIME OPHTHALMIC INJECTION 1 MG/0.1 ML
INJECTION | OPHTHALMIC | Status: DC | PRN
  Administered 2021-02-20: 0.1 mL via INTRACAMERAL

## 2021-02-20 MED ORDER — ACETAMINOPHEN 325 MG PO TABS
325.0000 mg | ORAL_TABLET | ORAL | Status: DC | PRN
Start: 2021-02-20 — End: 2021-02-20

## 2021-02-20 MED ORDER — LIDOCAINE HCL (PF) 2 % IJ SOLN
INTRAOCULAR | Status: DC | PRN
  Administered 2021-02-20: 1 mL

## 2021-02-20 MED ORDER — BRIMONIDINE TARTRATE-TIMOLOL 0.2-0.5 % OP SOLN
OPHTHALMIC | Status: DC | PRN
  Administered 2021-02-20: 1 [drp] via OPHTHALMIC

## 2021-02-20 MED ORDER — CYCLOPENTOLATE HCL 2 % OP SOLN
1.0000 [drp] | OPHTHALMIC | Status: DC | PRN
  Administered 2021-02-20 (×3): 1 [drp] via OPHTHALMIC

## 2021-02-20 MED ORDER — MIDAZOLAM HCL 2 MG/2ML IJ SOLN
INTRAMUSCULAR | Status: DC | PRN
  Administered 2021-02-20: 2 mg via INTRAVENOUS

## 2021-02-20 MED ORDER — LACTATED RINGERS IV SOLN: INTRAVENOUS | Status: DC

## 2021-02-20 MED ORDER — TETRACAINE HCL 0.5 % OP SOLN
1.0000 [drp] | OPHTHALMIC | Status: DC | PRN
  Administered 2021-02-20 (×3): 1 [drp] via OPHTHALMIC

## 2021-02-20 MED ORDER — PHENYLEPHRINE HCL 10 % OP SOLN
1.0000 [drp] | OPHTHALMIC | Status: DC | PRN
  Administered 2021-02-20 (×3): 1 [drp] via OPHTHALMIC

## 2021-02-20 MED ORDER — ACETAMINOPHEN 160 MG/5ML PO SOLN: 325.0000 mg | ORAL | Status: DC | PRN

## 2021-02-20 MED ORDER — FENTANYL CITRATE (PF) 100 MCG/2ML IJ SOLN
INTRAMUSCULAR | Status: DC | PRN
  Administered 2021-02-20 (×2): 50 ug via INTRAVENOUS

## 2021-02-20 SURGICAL SUPPLY — 21 items
CANNULA ANT/CHMB 27GA (MISCELLANEOUS) ×2 IMPLANT
GLOVE SURG ENC TEXT LTX SZ7.5 (GLOVE) ×2 IMPLANT
GLOVE SURG TRIUMPH 8.0 PF LTX (GLOVE) ×6 IMPLANT
GOWN STRL REUS W/ TWL LRG LVL3 (GOWN DISPOSABLE) ×2 IMPLANT
GOWN STRL REUS W/TWL LRG LVL3 (GOWN DISPOSABLE) ×4
LENS IOL TECNIS EYHANCE 15.0 (Intraocular Lens) ×2 IMPLANT
MARKER SKIN DUAL TIP RULER LAB (MISCELLANEOUS) ×2 IMPLANT
NDL RETROBULBAR .5 NSTRL (NEEDLE) IMPLANT
NEEDLE CAPSULORHEX 25GA (NEEDLE) ×2 IMPLANT
NEEDLE FILTER BLUNT 18X 1/2SAF (NEEDLE) ×2
NEEDLE FILTER BLUNT 18X1 1/2 (NEEDLE) ×2 IMPLANT
PACK EYE AFTER SURG (MISCELLANEOUS) ×2 IMPLANT
RING MALYGIN 7.0 (MISCELLANEOUS) IMPLANT
SUT ETHILON 10-0 CS-B-6CS-B-6 (SUTURE)
SUT VICRYL  9 0 (SUTURE)
SUT VICRYL 9 0 (SUTURE) IMPLANT
SUTURE EHLN 10-0 CS-B-6CS-B-6 (SUTURE) IMPLANT
SYR 3ML LL SCALE MARK (SYRINGE) ×4 IMPLANT
SYR TB 1ML LUER SLIP (SYRINGE) ×2 IMPLANT
WATER STERILE IRR 250ML POUR (IV SOLUTION) ×2 IMPLANT
WIPE NON LINTING 3.25X3.25 (MISCELLANEOUS) ×2 IMPLANT

## 2021-02-20 NOTE — Transfer of Care (Signed)
Immediate Anesthesia Transfer of Care Note  Patient: Tiffany Bonilla  Procedure(s) Performed: CATARACT EXTRACTION PHACO AND INTRAOCULAR LENS PLACEMENT (IOC) LEFT (Left: Eye)  Patient Location: PACU  Anesthesia Type: MAC  Level of Consciousness: awake, alert  and patient cooperative  Airway and Oxygen Therapy: Patient Spontanous Breathing and Patient connected to supplemental oxygen  Post-op Assessment: Post-op Vital signs reviewed, Patient's Cardiovascular Status Stable, Respiratory Function Stable, Patent Airway and No signs of Nausea or vomiting  Post-op Vital Signs: Reviewed and stable  Complications: No notable events documented.

## 2021-02-20 NOTE — H&P (Signed)
[facility]   Primary Care Physician:  Glean Hess, MD Ophthalmologist: Dr. Leandrew Koyanagi  Pre-Procedure History & Physical: HPI:  Tiffany Bonilla is a 65 y.o. female here for ophthalmic surgery.   Past Medical History:  Diagnosis Date   Allergy    Asthma    Hyperlipidemia    Hypertension    Pre-diabetes 11/24/2018   Spina bifida occulta    Stroke [facility])    SVT (supraventricular tachycardia) ([place]) 11/24/2018   S/p ablation at [facility]   Wears dentures    full upper    Past Surgical History:  Procedure Laterality Date   CARDIAC ELECTROPHYSIOLOGY STUDY AND ABLATION  2006   for SVT   CATARACT EXTRACTION W/PHACO Right 02/06/2021   Procedure: CATARACT EXTRACTION PHACO AND INTRAOCULAR LENS PLACEMENT ([place]) RIGHT;  Surgeon: Leandrew Koyanagi, MD;  Location: [place];  Service: Ophthalmology;  Laterality: Right;  2.85 0:32.6 8.7%   COLONOSCOPY  2006   CORONARY ANGIOPLASTY WITH STENT PLACEMENT  2001   to Circumfx   ESOPHAGOGASTRODUODENOSCOPY  2017   LAPAROSCOPIC CHOLECYSTECTOMY  04/25/2019   @ [facility]   LAPAROSCOPIC GASTRIC SLEEVE RESECTION  11/07/2015   TOTAL ABDOMINAL HYSTERECTOMY  1992   CIS cervix    Prior to Admission medications   Medication Sig Start Date End Date Taking? Authorizing Provider  aspirin EC 81 MG tablet Take 1 tablet by mouth daily.   Yes [provider]  Black Elderberry 50 MG/5ML SYRP Take by mouth.   Yes [provider]  cholecalciferol (VITAMIN D3) 25 MCG (1000 UNIT) tablet Take 2,000 Units by mouth daily.   Yes [provider]  escitalopram (LEXAPRO) 10 MG tablet Take 1 tablet (10 mg total) by mouth daily. 10/29/20  Yes Glean Hess, MD  gabapentin (NEURONTIN) 100 MG capsule TAKE 2 CAPSULES(200 MG) BY MOUTH IN THE MORNING AND AT BEDTIME FOR TREMORS 10/29/20  Yes Glean Hess, MD  lisinopril-hydrochlorothiazide (ZESTORETIC) 20-25 MG tablet Take 1 tablet by mouth daily. 10/29/20  Yes Glean Hess, MD  Melatonin 10 MG TABS Take 1 tablet by mouth at bedtime.   Yes [provider]  Multiple Vitamins-Minerals (ALIVE WOMENS ENERGY) TABS Take by mouth.   Yes [provider]  rosuvastatin (CRESTOR) 40 MG tablet Take 1 tablet (40 mg total) by mouth daily. 10/29/20  Yes Glean Hess, MD  clobetasol cream (TEMOVATE) 0.05 % Apply topically 2 (two) times daily. to affected area 06/15/20   Glean Hess, MD    Allergies as of 12/12/2020 - Review Complete 10/29/2020  Allergen Reaction Noted   Morphine Hives and Shortness Of Breath 09/02/2015   Scopolamine Other (See Comments) 11/25/2018   Codeine Nausea Only and Other (See Comments) 09/02/2015   Sulfa antibiotics Hives and Nausea Only 09/02/2015    Family History  Problem Relation Age of Onset   Aneurysm Mother     Social History   Socioeconomic History   Marital status: Widowed    Spouse name: Not on file   Number of children: 5   Years of education: Not on file   Highest education level: Not on file  Occupational History   Not on file  Tobacco Use   Smoking status: Every Day    Packs/day: 0.50    Years: 40.00    Pack years: 20.00    Types: Cigarettes    Start date: 11   Smokeless tobacco: Never  Vaping Use   Vaping Use: Never used  Substance  and Sexual Activity   Alcohol use: Yes    Comment: rare   Drug use: Never   Sexual activity: Not Currently  Other Topics Concern   Not on file  Social History Narrative   Pt lives with 2 of her daughters and 2 grandchildren   Social Determinants of Health   Financial Resource Strain: Low Risk    Difficulty of Paying Living Expenses: Not hard at all  Food Insecurity: No Food Insecurity   Worried About Charity fundraiser in the Last Year: Never true   Arboriculturist in the Last Year: Never true  Transportation Needs: No Transportation Needs   Lack of Transportation (Medical): No   Lack of Transportation (Non-Medical): No  Physical  Activity: Inactive   Days of Exercise per Week: 0 days   Minutes of Exercise per Session: 0 min  Stress: No Stress Concern Present   Feeling of Stress : Not at all  Social Connections: Socially Isolated   Frequency of Communication with Friends and Family: More than three times a week   Frequency of Social Gatherings with Friends and Family: More than three times a week   Attends Religious Services: Never   Marine scientist or Organizations: No   Attends Archivist Meetings: Never   Marital Status: Widowed  Human resources officer Violence: Not At Risk   Fear of Current or Ex-Partner: No   Emotionally Abused: No   Physically Abused: No   Sexually Abused: No    Review of Systems: See HPI, otherwise negative ROS  Physical Exam: BP 123/60   Pulse 67   Temp 97.7 F (36.5 C) (Temporal)   Resp 15   Ht 5\' 2"  (1.575 m)   Wt 61.9 kg   SpO2 96%   BMI 24.95 kg/m  General:   Alert,  pleasant and cooperative in NAD Head:  Normocephalic and atraumatic. Lungs:  Clear to auscultation.    Heart:  Regular rate and rhythm.   Impression/Plan: Tiffany Bonilla is here for ophthalmic surgery.  Risks, benefits, limitations, and alternatives regarding ophthalmic surgery have been reviewed with the patient.  Questions have been answered.  All parties agreeable.   Leandrew Koyanagi, MD  02/20/2021, 8:58 AM

## 2021-02-20 NOTE — Anesthesia Procedure Notes (Signed)
Procedure Name: MAC Date/Time: 02/20/2021 10:19 AM Performed by: Jeannene Patella, CRNA Pre-anesthesia Checklist: Patient identified, Emergency Drugs available, Suction available, Timeout performed and Patient being monitored Patient Re-evaluated:Patient Re-evaluated prior to induction Oxygen Delivery Method: Nasal cannula Placement Confirmation: positive ETCO2

## 2021-02-20 NOTE — Op Note (Signed)
  OPERATIVE NOTE  Rudie Rikard [id number] 02/20/2021   PREOPERATIVE DIAGNOSIS:  Nuclear sclerotic cataract left eye. H25.12   POSTOPERATIVE DIAGNOSIS:    Nuclear sclerotic cataract left eye.     PROCEDURE:  Phacoemusification with posterior chamber intraocular lens placement of the left eye  Ultrasound time: Procedure(s) with comments: CATARACT EXTRACTION PHACO AND INTRAOCULAR LENS PLACEMENT (IOC) LEFT (Left) - 3.12 0:39.5  LENS:   Implant Name Type Inv. Item Serial No. Manufacturer Lot No. LRB No. Used Action  LENS IOL TECNIS EYHANCE 15.0 - [id number] Intraocular Lens LENS IOL TECNIS EYHANCE 15.0 [id number] JOHNSON   Left 1 Implanted      SURGEON:  Wyonia Hough, MD   ANESTHESIA:  Topical with tetracaine drops and 2% Xylocaine jelly, augmented with 1% preservative-free intracameral lidocaine.    COMPLICATIONS:  None.   DESCRIPTION OF PROCEDURE:  The patient was identified in the holding room and transported to the operating room and placed in the supine position under the operating microscope.  The left eye was identified as the operative eye and it was prepped and draped in the usual sterile ophthalmic fashion.   A 1 millimeter clear-corneal paracentesis was made at the 1:30 position.  0.5 ml of preservative-free 1% lidocaine was injected into the anterior chamber.  The anterior chamber was filled with Viscoat viscoelastic.  A 2.4 millimeter keratome was used to make a near-clear corneal incision at the 10:30 position.  .  A curvilinear capsulorrhexis was made with a cystotome and capsulorrhexis forceps.  Balanced salt solution was used to hydrodissect and hydrodelineate the nucleus.   Phacoemulsification was then used in stop and chop fashion to remove the lens nucleus and epinucleus.  The remaining cortex was then removed using the irrigation and aspiration handpiece. Provisc was then placed into the capsular bag to distend it for lens placement.  A lens was then  injected into the capsular bag.  The remaining viscoelastic was aspirated.   Wounds were hydrated with balanced salt solution.  The anterior chamber was inflated to a physiologic pressure with balanced salt solution.  No wound leaks were noted. Cefuroxime 0.1 ml of a 10mg /ml solution was injected into the anterior chamber for a dose of 1 mg of intracameral antibiotic at the completion of the case.   Timolol and Brimonidine drops were applied to the eye.  The patient was taken to the recovery room in stable condition without complications of anesthesia or surgery.  Zakyah Yanes 02/20/2021, 10:26 AM

## 2021-02-20 NOTE — Anesthesia Postprocedure Evaluation (Signed)
Anesthesia Post Note  Patient: Tiffany Bonilla  Procedure(s) Performed: CATARACT EXTRACTION PHACO AND INTRAOCULAR LENS PLACEMENT (IOC) LEFT (Left: Eye)     Patient location during evaluation: PACU Anesthesia Type: MAC Level of consciousness: awake and alert Pain management: pain level controlled Vital Signs Assessment: post-procedure vital signs reviewed and stable Respiratory status: spontaneous breathing, nonlabored ventilation, respiratory function stable and patient connected to nasal cannula oxygen Cardiovascular status: stable and blood pressure returned to baseline Postop Assessment: no apparent nausea or vomiting Anesthetic complications: no   No notable events documented.  Trecia Rogers

## 2021-02-20 NOTE — Anesthesia Preprocedure Evaluation (Signed)
Anesthesia Evaluation  Patient identified by MRN, date of birth, ID band Patient awake    Reviewed: Allergy & Precautions, H&P , NPO status , Patient's Chart, lab work & pertinent test results, reviewed documented beta blocker date and time   Airway Mallampati: II  TM Distance: >3 FB Neck ROM: full    Dental  (+) Upper Dentures   Pulmonary asthma , sleep apnea , Current Smoker,    Pulmonary exam normal breath sounds clear to auscultation       Cardiovascular Exercise Tolerance: Good hypertension, + CAD  Normal cardiovascular exam Rhythm:regular Rate:Normal  Hx SVT s/p ablation   Neuro/Psych Anxiety CVA    GI/Hepatic Neg liver ROS, GERD  Controlled,  Endo/Other  negative endocrine ROS  Renal/GU negative Renal ROS  negative genitourinary   Musculoskeletal   Abdominal   Peds  Hematology negative hematology ROS (+)   Anesthesia Other Findings   Reproductive/Obstetrics negative OB ROS                             Anesthesia Physical Anesthesia Plan  ASA: 3  Anesthesia Plan: MAC   Post-op Pain Management:    Induction:   PONV Risk Score and Plan:   Airway Management Planned:   Additional Equipment:   Intra-op Plan:   Post-operative Plan:   Informed Consent: I have reviewed the patients History and Physical, chart, labs and discussed the procedure including the risks, benefits and alternatives for the proposed anesthesia with the patient or authorized representative who has indicated his/her understanding and acceptance.     Dental Advisory Given  Plan Discussed with: CRNA and Anesthesiologist  Anesthesia Plan Comments:         Anesthesia Quick Evaluation

## 2021-03-06 ENCOUNTER — Encounter: Payer: Self-pay | Admitting: Ophthalmology

## 2021-03-11 ENCOUNTER — Encounter: Payer: Self-pay | Admitting: Internal Medicine

## 2021-03-11 ENCOUNTER — Other Ambulatory Visit: Payer: Self-pay

## 2021-03-11 ENCOUNTER — Ambulatory Visit: Payer: Self-pay | Admitting: *Deleted

## 2021-03-11 ENCOUNTER — Ambulatory Visit (INDEPENDENT_AMBULATORY_CARE_PROVIDER_SITE_OTHER): Payer: Medicare HMO | Admitting: Internal Medicine

## 2021-03-11 VITALS — BP 130/70 | HR 71 | Resp 14 | Ht 62.0 in | Wt 135.0 lb

## 2021-03-11 DIAGNOSIS — I10 Essential (primary) hypertension: Secondary | ICD-10-CM | POA: Diagnosis not present

## 2021-03-11 MED ORDER — LISINOPRIL 20 MG PO TABS: 20.0000 mg | ORAL_TABLET | Freq: Every day | ORAL | 1 refills | Status: DC

## 2021-03-11 NOTE — Telephone Encounter (Signed)
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   This has never happened to her before.   She's had 2 cups of coffee and a ham biscuit this morning.   She took her BP medications last night as usual.   No medication or changes to her health.   BP at 7:00 this morning when this all started 78/51.  Right before calling it was 90/53.   She did have a headache when her BP first dropped which has resolved now.   Denies any other symptoms other than have some diarrhea for the past couple of days.   3 stools/day.   She is eating and drinking normally.  I haven't tried to get up again in a few minutes.   I'm just sitting here and others are helping with my pts at the moment.  I have sent my notes to [facility] igh priority.     She did not want to go to the ED.   "You know how those places are".    "I'll go if I have to but I really prefer to see Dr. Army Melia today if possible".    I made her an appt for today at 1:20 in office visit with the understanding she may still need to go to the ED.   She was agreeable to this plan.

## 2021-03-11 NOTE — Telephone Encounter (Signed)
Reason for Disposition  SEVERE dizziness (e.g., unable to stand, requires support to walk, feels like passing out now)  Answer Assessment - Initial Assessment Questions 1. DESCRIPTION: "Describe your dizziness."     I'm having dizziness, headache, 78/51 and 90/51 this morning.   This is very unusual since 7:00 this morning.   It's getting worse.   When I get up I'm very swimmy headed.   I'm on Lisinopril and HCTZ for BP.   No dose changes.   Taking daily.  Dr. Army Melia put me on lisinopril long time ago. 2. LIGHTHEADED: "Do you feel lightheaded?" (e.g., somewhat faint, woozy, weak upon standing)     I'm feeling very swimmy headed.  As long as I'm sitting here I'm not so dizzy but I'm still very dizzy.    It doesn't make it worse to turn my head.   Never happened before.   No nausea.   I'm having diarrhea.   3 stools loose for 2 days.   I'm drinking fluids.   My appetite is normal.   No sweating 3. VERTIGO: "Do you feel like either you or the room is spinning or tilting?" (i.e. vertigo)     No 4. SEVERITY: "How bad is it?"  "Do you feel like you are going to faint?" "Can you stand and walk?"   - MILD: Feels slightly dizzy, but walking normally.   - MODERATE: Feels unsteady when walking, but not falling; interferes with normal activities (e.g., school, work).   - SEVERE: Unable to walk without falling, or requires assistance to walk without falling; feels like passing out now.      Severe.   I feel like the headache was from the BP dropping.   No headache now.   I have not tried to get up.    5. ONSET:  "When did the dizziness begin?"     7:00 AM this morning.    6. AGGRAVATING FACTORS: "Does anything make it worse?" (e.g., standing, change in head position)     Getting up and moving around.   7. HEART RATE: "Can you tell me your heart rate?" "How many beats in 15 seconds?"  (Note: not all patients can do this)       PUlse 107 when passing meds.   Sitting it's 74. Regular 8. CAUSE: "What do you  think is causing the dizziness?"     I don't know.  I've had 2 cups of coffee and a ham biscuit this morning.   I take my BP medicine at night. 9. RECURRENT SYMPTOM: "Have you had dizziness before?" If Yes, ask: "When was the last time?" "What happened that time?"     No 10. OTHER SYMPTOMS: "Do you have any other symptoms?" (e.g., fever, chest pain, vomiting, diarrhea, bleeding)       Diarrhea for 2 days   3 stools a day each 11. PREGNANCY: "Is there any chance you are pregnant?" "When was your last menstrual period?"       N/A due to age  Protocols used: Dizziness - Lightheadedness-A-AH

## 2021-03-11 NOTE — Progress Notes (Signed)
Date:  03/11/2021   Name:  Tiffany Bonilla   DOB:  08/18/1956   MRN:  [id number]   Chief Complaint: Hypotension (X 2 days, Low BP, With Dizziness per patient, had headache and felt like she was going to pass out, negative covid )  Hypertension This is a chronic problem. The problem is controlled. Associated symptoms include palpitations. Pertinent negatives include no chest pain, headaches, peripheral edema or shortness of breath. (dizziness) Past treatments include ACE inhibitors and diuretics. The current treatment provides significant (she feels like she is running too low a BP) improvement.   Lab Results  Component Value Date   CREATININE 1.16 (H) 04/09/2020   BUN 20 04/09/2020   NA 140 04/09/2020   K 3.8 04/09/2020   CL 99 04/09/2020   CO2 26 04/09/2020   Lab Results  Component Value Date   CHOL 152 04/09/2020   HDL 64 04/09/2020   LDLCALC 74 04/09/2020   TRIG 74 04/09/2020   CHOLHDL 2.4 04/09/2020   Lab Results  Component Value Date   TSH 1.300 04/09/2020   Lab Results  Component Value Date   HGBA1C 5.5 02/16/2019   Lab Results  Component Value Date   WBC 9.3 04/09/2020   HGB 15.7 04/09/2020   HCT 47.7 (H) 04/09/2020   MCV 95 04/09/2020   PLT 202 04/09/2020   Lab Results  Component Value Date   ALT 21 04/09/2020   AST 26 04/09/2020   ALKPHOS 46 (L) 04/09/2020   BILITOT 0.4 04/09/2020     Review of Systems  Constitutional:  Negative for chills, fatigue and fever.  Respiratory:  Negative for cough, chest tightness and shortness of breath.   Cardiovascular:  Positive for palpitations. Negative for chest pain.  Neurological:  Positive for dizziness and light-headedness. Negative for headaches.  Psychiatric/Behavioral:  Negative for dysphoric mood and sleep disturbance. The patient is not nervous/anxious.    Patient Active Problem List   Diagnosis Date Noted   Atrophic kidney 09/27/2020   Aortic aneurysm, abdominal ([place]) 09/27/2020   Benign  essential tremor 04/09/2020   Vitamin D deficiency 09/06/2019   Other specified anxiety disorders 20/25/4270   Lichen sclerosus et atrophicus of the vulva 11/25/2018   Essential hypertension 11/24/2018   Old lacunar stroke without late effect 11/24/2018   Current every day smoker 10/25/2015   Hx of hepatitis C 10/25/2015   CAD in native artery 05/24/2015   Mixed hyperlipidemia 06/12/2011    Allergies  Allergen Reactions   Morphine Hives and Shortness Of Breath    Stopped breathing    Scopolamine Other (See Comments)    Unconscious    Codeine Nausea Only and Other (See Comments)    NAUSEA/VOMITING    Sulfa Antibiotics Hives and Nausea Only    Other Reaction: Not Assessed     Past Surgical History:  Procedure Laterality Date   CARDIAC ELECTROPHYSIOLOGY STUDY AND ABLATION  2006   for SVT   CATARACT EXTRACTION W/PHACO Right 02/06/2021   Procedure: CATARACT EXTRACTION PHACO AND INTRAOCULAR LENS PLACEMENT ([place]) RIGHT;  Surgeon: Leandrew Koyanagi, MD;  Location: [place];  Service: Ophthalmology;  Laterality: Right;  2.85 0:32.6 8.7%   CATARACT EXTRACTION W/PHACO Left 02/20/2021   Procedure: CATARACT EXTRACTION PHACO AND INTRAOCULAR LENS PLACEMENT ([place]) LEFT;  Surgeon: Leandrew Koyanagi, MD;  Location: [place];  Service: Ophthalmology;  Laterality: Left;  3.12 0:39.5   COLONOSCOPY  2006   CORONARY ANGIOPLASTY WITH STENT PLACEMENT  2001  to Circumfx   ESOPHAGOGASTRODUODENOSCOPY  2017   LAPAROSCOPIC CHOLECYSTECTOMY  04/25/2019   @ [facility]   LAPAROSCOPIC GASTRIC SLEEVE RESECTION  11/07/2015   TOTAL ABDOMINAL HYSTERECTOMY  1992   CIS cervix    Social History   Tobacco Use   Smoking status: Every Day    Packs/day: 0.50    Years: 40.00    Pack years: 20.00    Types: Cigarettes    Start date: 1980   Smokeless tobacco: Never  Vaping Use   Vaping Use: Never used  Substance Use Topics   Alcohol use: Yes    Comment: rare   Drug use: Never      Medication list has been reviewed and updated.  Current Meds  Medication Sig   aspirin EC 81 MG tablet Take 1 tablet by mouth daily.   Black Elderberry 50 MG/5ML SYRP Take by mouth.   cholecalciferol (VITAMIN D3) 25 MCG (1000 UNIT) tablet Take 2,000 Units by mouth daily.   clobetasol cream (TEMOVATE) 0.05 % Apply topically 2 (two) times daily. to affected area   escitalopram (LEXAPRO) 10 MG tablet Take 1 tablet (10 mg total) by mouth daily.   gabapentin (NEURONTIN) 100 MG capsule TAKE 2 CAPSULES(200 MG) BY MOUTH IN THE MORNING AND AT BEDTIME FOR TREMORS   lisinopril-hydrochlorothiazide (ZESTORETIC) 20-25 MG tablet Take 1 tablet by mouth daily.   Melatonin 10 MG TABS Take 1 tablet by mouth at bedtime.   Multiple Vitamins-Minerals (ALIVE WOMENS ENERGY) TABS Take by mouth.   rosuvastatin (CRESTOR) 40 MG tablet Take 1 tablet (40 mg total) by mouth daily.    PHQ 2/9 Scores 10/29/2020 08/20/2020 04/09/2020 12/23/2019  PHQ - 2 Score 0 0 0 0  PHQ- 9 Score 0 - 0 0    GAD 7 : Generalized Anxiety Score 10/29/2020 12/23/2019 02/16/2019  Nervous, Anxious, on Edge 0 0 3  Control/stop worrying 0 0 1  Worry too much - different things 0 0 1  Trouble relaxing 0 0 1  Restless 0 0 1  Easily annoyed or irritable 0 0 2  Afraid - awful might happen 0 0 3  Total GAD 7 Score 0 0 12  Anxiety Difficulty Not difficult at all Not difficult at all Very difficult    BP Readings from Last 3 Encounters:  03/11/21 130/70  02/20/21 (!) 131/51  02/06/21 112/70    Physical Exam Constitutional:      Appearance: Normal appearance.  Cardiovascular:     Rate and Rhythm: Normal rate and regular rhythm.     Pulses: Normal pulses.     Heart sounds: No murmur heard. Pulmonary:     Effort: Pulmonary effort is normal.     Breath sounds: Normal breath sounds. No wheezing or rhonchi.  Musculoskeletal:     Cervical back: Normal range of motion.     Right lower leg: No edema.     Left lower leg: No edema.   Lymphadenopathy:     Cervical: No cervical adenopathy.  Skin:    General: Skin is warm and dry.  Neurological:     General: No focal deficit present.     Mental Status: She is alert.    Wt Readings from Last 3 Encounters:  03/11/21 135 lb (61.2 kg)  02/20/21 136 lb 6.4 oz (61.9 kg)  02/06/21 135 lb (61.2 kg)    BP 130/70 (BP Location: Right Arm, Patient Position: Sitting, Cuff Size: Normal)   Pulse 71   Resp 14   Ht 5'  2" (1.575 m)   Wt 135 lb (61.2 kg)   SpO2 98%   BMI 24.69 kg/m   Assessment and Plan: 1. Essential hypertension BP is running low and she is not staying hydrated Change to plain lisinopril Follow up in one month at scheduled appointment - lisinopril (ZESTRIL) 20 MG tablet; Take 1 tablet (20 mg total) by mouth daily.  Dispense: 90 tablet; Refill: 1   Partially dictated using Editor, commissioning. Any errors are unintentional.  Halina Maidens, MD [facility]  03/11/2021

## 2021-03-11 NOTE — Telephone Encounter (Signed)
Noted  KP 

## 2021-03-14 ENCOUNTER — Encounter: Payer: Self-pay | Admitting: Ophthalmology

## 2021-04-03 ENCOUNTER — Other Ambulatory Visit: Payer: Self-pay

## 2021-04-03 ENCOUNTER — Telehealth: Payer: Self-pay

## 2021-04-03 DIAGNOSIS — I10 Essential (primary) hypertension: Secondary | ICD-10-CM

## 2021-04-03 MED ORDER — LISINOPRIL-HYDROCHLOROTHIAZIDE 20-25 MG PO TABS: 1.0000 | ORAL_TABLET | Freq: Every day | ORAL | 0 refills | Status: DC

## 2021-04-03 NOTE — Telephone Encounter (Signed)
Copied from [place] [id number]. Topic: General - Other >> Apr 03, 2021 11:02 AM Leward Quan A wrote: Reason for CRM: Patient called in to speak to Dr Army Melia nurse states that her BP medication is not working BP have been getting higher daily today BP is 186-106 cause headache. Please call today at Ph# [phone]

## 2021-04-03 NOTE — Telephone Encounter (Signed)
Patient informed. 

## 2021-04-05 ENCOUNTER — Other Ambulatory Visit: Payer: Self-pay | Admitting: Internal Medicine

## 2021-04-05 DIAGNOSIS — E2839 Other primary ovarian failure: Secondary | ICD-10-CM

## 2021-04-06 DIAGNOSIS — R0602 Shortness of breath: Secondary | ICD-10-CM | POA: Diagnosis not present

## 2021-04-06 DIAGNOSIS — R791 Abnormal coagulation profile: Secondary | ICD-10-CM | POA: Diagnosis not present

## 2021-04-06 DIAGNOSIS — R778 Other specified abnormalities of plasma proteins: Secondary | ICD-10-CM | POA: Diagnosis not present

## 2021-04-06 DIAGNOSIS — I214 Non-ST elevation (NSTEMI) myocardial infarction: Secondary | ICD-10-CM | POA: Diagnosis not present

## 2021-04-06 DIAGNOSIS — Z515 Encounter for palliative care: Secondary | ICD-10-CM | POA: Diagnosis not present

## 2021-04-06 DIAGNOSIS — I517 Cardiomegaly: Secondary | ICD-10-CM | POA: Diagnosis not present

## 2021-04-06 DIAGNOSIS — R61 Generalized hyperhidrosis: Secondary | ICD-10-CM | POA: Diagnosis not present

## 2021-04-06 DIAGNOSIS — R519 Headache, unspecified: Secondary | ICD-10-CM | POA: Diagnosis not present

## 2021-04-06 DIAGNOSIS — I2129 ST elevation (STEMI) myocardial infarction involving other sites: Secondary | ICD-10-CM | POA: Diagnosis not present

## 2021-04-06 DIAGNOSIS — I2510 Atherosclerotic heart disease of native coronary artery without angina pectoris: Secondary | ICD-10-CM | POA: Diagnosis not present

## 2021-04-06 DIAGNOSIS — I499 Cardiac arrhythmia, unspecified: Secondary | ICD-10-CM | POA: Diagnosis not present

## 2021-04-06 DIAGNOSIS — J449 Chronic obstructive pulmonary disease, unspecified: Secondary | ICD-10-CM | POA: Diagnosis not present

## 2021-04-06 DIAGNOSIS — Z20822 Contact with and (suspected) exposure to covid-19: Secondary | ICD-10-CM | POA: Diagnosis not present

## 2021-04-06 DIAGNOSIS — R55 Syncope and collapse: Secondary | ICD-10-CM | POA: Diagnosis not present

## 2021-04-06 DIAGNOSIS — R748 Abnormal levels of other serum enzymes: Secondary | ICD-10-CM | POA: Diagnosis not present

## 2021-04-06 DIAGNOSIS — D72829 Elevated white blood cell count, unspecified: Secondary | ICD-10-CM | POA: Diagnosis not present

## 2021-04-06 DIAGNOSIS — N179 Acute kidney failure, unspecified: Secondary | ICD-10-CM | POA: Diagnosis not present

## 2021-04-06 DIAGNOSIS — R42 Dizziness and giddiness: Secondary | ICD-10-CM | POA: Diagnosis not present

## 2021-04-06 DIAGNOSIS — I714 Abdominal aortic aneurysm, without rupture: Secondary | ICD-10-CM | POA: Diagnosis not present

## 2021-04-06 DIAGNOSIS — G20 Parkinson's disease: Secondary | ICD-10-CM | POA: Diagnosis not present

## 2021-04-07 DIAGNOSIS — R197 Diarrhea, unspecified: Secondary | ICD-10-CM | POA: Insufficient documentation

## 2021-04-07 DIAGNOSIS — R42 Dizziness and giddiness: Secondary | ICD-10-CM | POA: Insufficient documentation

## 2021-04-07 DIAGNOSIS — F17200 Nicotine dependence, unspecified, uncomplicated: Secondary | ICD-10-CM | POA: Diagnosis not present

## 2021-04-07 DIAGNOSIS — R52 Pain, unspecified: Secondary | ICD-10-CM | POA: Diagnosis not present

## 2021-04-07 DIAGNOSIS — I10 Essential (primary) hypertension: Secondary | ICD-10-CM | POA: Diagnosis not present

## 2021-04-07 DIAGNOSIS — R778 Other specified abnormalities of plasma proteins: Secondary | ICD-10-CM | POA: Insufficient documentation

## 2021-04-07 DIAGNOSIS — R7989 Other specified abnormal findings of blood chemistry: Secondary | ICD-10-CM | POA: Insufficient documentation

## 2021-04-07 DIAGNOSIS — R55 Syncope and collapse: Secondary | ICD-10-CM | POA: Insufficient documentation

## 2021-04-07 DIAGNOSIS — I2510 Atherosclerotic heart disease of native coronary artery without angina pectoris: Secondary | ICD-10-CM | POA: Diagnosis not present

## 2021-04-08 ENCOUNTER — Telehealth: Payer: Self-pay

## 2021-04-08 NOTE — Telephone Encounter (Signed)
Copied from [place] [id number]. Topic: General - Other >> Apr 08, 2021 10:16 AM Leward Quan A wrote: Reason for CRM: Patient called in to inform Dr Army Melia that she went to the ER at [facility]  on 04/06/21 stayed till 04/07/21. Due to BP dropping too low and have not BP medication since also the cholesterol medication. Would like to be seen today or tomorrow please call Ph# [phone]

## 2021-04-08 NOTE — Telephone Encounter (Signed)
Sure that would be fine

## 2021-04-08 NOTE — Telephone Encounter (Signed)
Transition Care Management Follow-up Telephone Call Date of discharge and from where: 04/07/21 [facility] pt seen in observation > 10 hours How have you been since you were released from the hospital? Pt states doing okay Any questions or concerns? No  Items Reviewed: Did the pt receive and understand the discharge instructions provided? Yes  Medications obtained and verified? Yes  Other? No  Any new allergies since your discharge? No  Dietary orders reviewed? Yes Do you have support at home? Yes   Home Care and Equipment/Supplies: Were home health services ordered? no  Were any new equipment or medical supplies ordered?  No  Functional Questionnaire: (I = Independent and D = Dependent) ADLs: I  Bathing/Dressing- I  Meal Prep- I  Eating- I  Maintaining continence- I  Transferring/Ambulation- I  Managing Meds- I  Follow up appointments reviewed:  PCP Hospital f/u appt confirmed? Yes  Scheduled to see Dr. Zigmund Daniel on 04/09/21 @ 10:20. Are transportation arrangements needed? No  If their condition worsens, is the pt aware to call PCP or go to the Emergency Dept.? Yes Was the patient provided with contact information for the PCP's office or ED? Yes Was to pt encouraged to call back with questions or concerns? Yes

## 2021-04-09 ENCOUNTER — Ambulatory Visit (INDEPENDENT_AMBULATORY_CARE_PROVIDER_SITE_OTHER): Payer: Medicare HMO | Admitting: Family Medicine

## 2021-04-09 ENCOUNTER — Encounter: Payer: Self-pay | Admitting: Family Medicine

## 2021-04-09 ENCOUNTER — Other Ambulatory Visit: Payer: Self-pay

## 2021-04-09 VITALS — BP 142/84 | HR 78 | Temp 97.9°F | Ht 62.0 in | Wt 133.0 lb

## 2021-04-09 DIAGNOSIS — I10 Essential (primary) hypertension: Secondary | ICD-10-CM

## 2021-04-09 DIAGNOSIS — F418 Other specified anxiety disorders: Secondary | ICD-10-CM

## 2021-04-09 MED ORDER — LISINOPRIL 10 MG PO TABS: 10.0000 mg | ORAL_TABLET | Freq: Every day | ORAL | 3 refills | Status: DC

## 2021-04-09 MED ORDER — ESCITALOPRAM OXALATE 20 MG PO TABS: 20.0000 mg | ORAL_TABLET | Freq: Every day | ORAL | 2 refills | Status: DC

## 2021-04-09 NOTE — Assessment & Plan Note (Signed)
Depression screen [facility] 2/9 04/09/2021 03/11/2021 10/29/2020 08/20/2020 04/09/2020  Decreased Interest 0 0 0 0 0  Down, Depressed, Hopeless 0 0 0 0 0  PHQ - 2 Score 0 0 0 0 0  Altered sleeping 0 1 0 - 0  Tired, decreased energy 1 1 0 - 0  Change in appetite 0 0 0 - 0  Feeling bad or failure about yourself  0 0 0 - 0  Trouble concentrating 0 0 0 - 0  Moving slowly or fidgety/restless 0 0 0 - 0  Suicidal thoughts 0 0 0 - 0  PHQ-9 Score 1 2 0 - 0  Difficult doing work/chores Somewhat difficult Not difficult at all Not difficult at all - Not difficult at all   GAD 7 : Generalized Anxiety Score 04/09/2021 03/11/2021 10/29/2020 12/23/2019  Nervous, Anxious, on Edge 1 0 0 0  Control/stop worrying 2 0 0 0  Worry too much - different things 2 0 0 0  Trouble relaxing 2 0 0 0  Restless 0 0 0 0  Easily annoyed or irritable 0 0 0 0  Afraid - awful might happen 0 0 0 0  Total GAD 7 Score 7 0 0 0  Anxiety Difficulty - Not difficult at all Not difficult at all Not difficult at all   Given her recent increase in life stressors, events leading to ER visit, discussion about increasing her escitalopram to place and she is amenable to dosing at 20 mg daily.  She has follow-up on 8/18 with Dr. Army Melia, patient to stand that she may not note the extent of this dose change at that time, can continue to monitor.

## 2021-04-09 NOTE — Assessment & Plan Note (Addendum)
Patient presents for ER follow-up from visit on 04/06/2021, discharged from ER on 04/07/2021.  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  In the ER she was found to be hypotensive and with elevated troponin, incidentally noted leukocytosis, and elevated D-dimer.  Leukocytosis was thought secondary to noted diarrhea after interaction with her grandchild, D-dimer increase considered acute phase reactant to the viral gastroenteritis.  The hypotension was considered secondary to HCTZ component coupled with decreased nutritional intake, and added work stress.  Troponin increase considered secondary to history of CAD in the setting of hypotension.  Patient was discharged off of atorvastatin and lisinopril-HCTZ 20-25 mg.  - Restart lisinopril at 10 mg daily - Restart of rosuvastatin can be through Dr. Army Melia - Patient will monitor for signs of low blood pressure - She will maintain follow-up with Dr. Army Melia on 8/18 for her annual physical, further need for medication titration to be reviewed then  Moderate complexity decision making performed inclusive of chronic condition with exacerbation and prescription drug management.

## 2021-04-09 NOTE — Patient Instructions (Signed)
-   Take new lisinopril and Lexapro - Keep visit with Dr. Army Melia - Contact for questions

## 2021-04-09 NOTE — Progress Notes (Signed)
Primary Care / Sports Medicine Office Visit  Patient Information:  Patient ID: Tiffany Bonilla, female DOB: 21-Jul-1956 Age: 65 y.o. MRN: [id number]   Tiffany Bonilla is a pleasant 65 y.o. female presents for Hospitalization Follow-up Visit:  Chief Complaint  Patient presents with   Hypotension    Hospitalization Follow-up Visit, stay in [facility] ED from 8/6-8/7 with hypontension. Transition Care Management telephone call was performed on 04/08/2021 with Clemetine Marker, Nurse Health Advisor.  Went to ED (syncope) 8/6 they took her off bp and cholesterol medication, has checked bp since leaving ED it was 180/90s    Review of Systems pertinent details above   Patient Active Problem List   Diagnosis Date Noted   Diarrhea 04/07/2021   Elevated troponin 04/07/2021   Postural dizziness with presyncope 04/07/2021   Atrophic kidney 09/27/2020   Aortic aneurysm, abdominal ([place]) 09/27/2020   Benign essential tremor 04/09/2020   Vitamin D deficiency 09/06/2019   Other specified anxiety disorders 123456   Lichen sclerosus et atrophicus of the vulva 11/25/2018   Essential hypertension 11/24/2018   Old lacunar stroke without late effect 11/24/2018   Current every day smoker 10/25/2015   Hx of hepatitis C 10/25/2015   CAD in native artery 05/24/2015   Mixed hyperlipidemia 06/12/2011   Past Medical History:  Diagnosis Date   Allergy    Asthma    Hyperlipidemia    Hypertension    Pre-diabetes 11/24/2018   Spina bifida occulta    Stroke [facility])    SVT (supraventricular tachycardia) ([place]) 11/24/2018   S/p ablation at [facility]   Wears dentures    full upper   Outpatient Encounter Medications as of 04/09/2021  Medication Sig Note   aspirin EC 81 MG tablet Take 1 tablet by mouth daily.    Black Elderberry 50 MG/5ML SYRP Take by mouth. 08/20/2020: Gummies    cholecalciferol (VITAMIN D3) 25 MCG (1000 UNIT) tablet Take 2,000 Units by mouth daily.    clobetasol cream (TEMOVATE) 0.05 %  Apply topically 2 (two) times daily. to affected area    gabapentin (NEURONTIN) 100 MG capsule TAKE 2 CAPSULES(200 MG) BY MOUTH IN THE MORNING AND AT BEDTIME FOR TREMORS    lisinopril (ZESTRIL) 10 MG tablet Take 1 tablet (10 mg total) by mouth daily.    Melatonin 10 MG TABS Take 1 tablet by mouth at bedtime.    Multiple Vitamins-Minerals (ALIVE WOMENS ENERGY) TABS Take by mouth.    [DISCONTINUED] escitalopram (LEXAPRO) 10 MG tablet Take 1 tablet (10 mg total) by mouth daily.    escitalopram (LEXAPRO) 20 MG tablet Take 1 tablet (20 mg total) by mouth daily.    rosuvastatin (CRESTOR) 40 MG tablet Take 1 tablet (40 mg total) by mouth daily. (Patient not taking: Reported on 04/09/2021)    [DISCONTINUED] lisinopril-hydrochlorothiazide (ZESTORETIC) 20-25 MG tablet Take 1 tablet by mouth daily. (Patient not taking: Reported on 04/09/2021)    No facility-administered encounter medications on file as of 04/09/2021.   Past Surgical History:  Procedure Laterality Date   CARDIAC ELECTROPHYSIOLOGY STUDY AND ABLATION  2006   for SVT   CATARACT EXTRACTION W/PHACO Right 02/06/2021   Procedure: CATARACT EXTRACTION PHACO AND INTRAOCULAR LENS PLACEMENT ([place]) RIGHT;  Surgeon: Leandrew Koyanagi, MD;  Location: [place];  Service: Ophthalmology;  Laterality: Right;  2.85 0:32.6 8.7%   CATARACT EXTRACTION W/PHACO Left 02/20/2021   Procedure: CATARACT EXTRACTION PHACO AND INTRAOCULAR LENS PLACEMENT ([place]) LEFT;  Surgeon: Leandrew Koyanagi, MD;  [facility];  Service: Ophthalmology;  Laterality: Left;  3.12 0:39.5   COLONOSCOPY  2006   CORONARY ANGIOPLASTY WITH STENT PLACEMENT  2001   to Circumfx   ESOPHAGOGASTRODUODENOSCOPY  2017   LAPAROSCOPIC CHOLECYSTECTOMY  04/25/2019   @ [facility]   LAPAROSCOPIC GASTRIC SLEEVE RESECTION  11/07/2015   TOTAL ABDOMINAL HYSTERECTOMY  1992   CIS cervix    Vitals:   04/09/21 0838  BP: (!) 142/84  Pulse: 78  Temp: 97.9 F (36.6 C)  SpO2: 97%    Vitals:   04/09/21 0838  Weight: 133 lb (60.3 kg)  Height: '5\' 2"'$  (1.575 m)   Body mass index is 24.33 kg/m.  Physical Exam General: Well Developed, well nourished, and in no acute distress.  Neuro: Alert and oriented x3, extra-ocular muscles intact, sensation grossly intact. Cranial nerves II through XII are intact, motor, sensory, and coordinative functions are grossly intact. HEENT: Normocephalic, atraumatic, neck supple, no masses, no lymphadenopathy. Skin: Warm and dry, no rashes noted.  Cardiac: Regular rate and rhythm, no murmurs, rubs, or gallops. No JVD, symmetric pulses. Respiratory: Clear to auscultation bilaterally. Not using accessory muscles, speaking in full sentences.  Abdominal: Soft, nontender, nondistended, positive bowel sounds, no masses, no organomegaly.  Musculoskeletal: Shoulder, elbow, wrist, hip, knee, ankle move freely.  No results found.   Independent interpretation of notes and tests performed by another provider:   None  Procedures performed:   None  Pertinent History, Exam, Impression, and Recommendations:   Essential hypertension Patient presents for ER follow-up from visit on 04/06/2021, discharged from ER on 04/07/2021.  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  In the ER she was found to be hypotensive and with elevated troponin, incidentally noted leukocytosis, and elevated D-dimer.  Leukocytosis was thought secondary to noted diarrhea after interaction with her grandchild, D-dimer increase considered acute phase reactant to the viral gastroenteritis.  The hypotension was considered secondary to HCTZ component coupled with decreased nutritional intake, and added work stress.  Troponin increase considered secondary to history of CAD in the setting of hypotension.  Patient was discharged off of atorvastatin and  lisinopril-HCTZ 20-25 mg.  - Restart lisinopril at 10 mg daily - Restart of rosuvastatin can be through Dr. Army Melia - Patient will monitor for signs of low blood pressure - She will maintain follow-up with Dr. Army Melia on 8/18 for her annual physical, further need for medication titration to be reviewed then  Moderate complexity decision making performed inclusive of chronic condition with exacerbation and prescription drug management.  Other specified anxiety disorders Depression screen [facility] 2/9 04/09/2021 03/11/2021 10/29/2020 08/20/2020 04/09/2020  Decreased Interest 0 0 0 0 0  Down, Depressed, Hopeless 0 0 0 0 0  PHQ - 2 Score 0 0 0 0 0  Altered sleeping 0 1 0 - 0  Tired, decreased energy 1 1 0 - 0  Change in appetite 0 0 0 - 0  Feeling bad or failure about yourself  0 0 0 - 0  Trouble concentrating 0 0 0 - 0  Moving slowly or fidgety/restless 0 0 0 - 0  Suicidal thoughts 0 0 0 - 0  PHQ-9 Score 1 2 0 - 0  Difficult doing work/chores Somewhat difficult Not difficult at all Not difficult at all - Not difficult at all   GAD 7 : Generalized Anxiety Score 04/09/2021 03/11/2021 10/29/2020 12/23/2019  Nervous, Anxious,  on Edge 1 0 0 0  Control/stop worrying 2 0 0 0  Worry too much - different things 2 0 0 0  Trouble relaxing 2 0 0 0  Restless 0 0 0 0  Easily annoyed or irritable 0 0 0 0  Afraid - awful might happen 0 0 0 0  Total GAD 7 Score 7 0 0 0  Anxiety Difficulty - Not difficult at all Not difficult at all Not difficult at all   Given her recent increase in life stressors, events leading to ER visit, discussion about increasing her escitalopram to place and she is amenable to dosing at 20 mg daily.  She has follow-up on 8/18 with Dr. Army Melia, patient to stand that she may not note the extent of this dose change at that time, can continue to monitor.    Orders & Medications Meds ordered this encounter  Medications   escitalopram (LEXAPRO) 20 MG tablet    Sig: Take 1 tablet (20 mg  total) by mouth daily.    Dispense:  30 tablet    Refill:  2   lisinopril (ZESTRIL) 10 MG tablet    Sig: Take 1 tablet (10 mg total) by mouth daily.    Dispense:  30 tablet    Refill:  3   No orders of the defined types were placed in this encounter.    Return for As scheduled with Dr. Army Melia.     Montel Culver, MD   Primary Care Sports Medicine [place]

## 2021-04-11 ENCOUNTER — Encounter: Payer: Medicare HMO | Admitting: Internal Medicine

## 2021-04-18 ENCOUNTER — Other Ambulatory Visit: Payer: Self-pay

## 2021-04-18 ENCOUNTER — Ambulatory Visit (INDEPENDENT_AMBULATORY_CARE_PROVIDER_SITE_OTHER): Payer: Medicare HMO | Admitting: Internal Medicine

## 2021-04-18 ENCOUNTER — Encounter: Payer: Self-pay | Admitting: Internal Medicine

## 2021-04-18 VITALS — BP 128/78 | HR 79 | Temp 98.6°F | Ht 62.0 in | Wt 132.0 lb

## 2021-04-18 DIAGNOSIS — I10 Essential (primary) hypertension: Secondary | ICD-10-CM | POA: Diagnosis not present

## 2021-04-18 DIAGNOSIS — Z0000 Encounter for general adult medical examination without abnormal findings: Secondary | ICD-10-CM

## 2021-04-18 DIAGNOSIS — Z1231 Encounter for screening mammogram for malignant neoplasm of breast: Secondary | ICD-10-CM

## 2021-04-18 DIAGNOSIS — I2510 Atherosclerotic heart disease of native coronary artery without angina pectoris: Secondary | ICD-10-CM | POA: Diagnosis not present

## 2021-04-18 DIAGNOSIS — I714 Abdominal aortic aneurysm, without rupture, unspecified: Secondary | ICD-10-CM

## 2021-04-18 DIAGNOSIS — Z1211 Encounter for screening for malignant neoplasm of colon: Secondary | ICD-10-CM

## 2021-04-18 DIAGNOSIS — F17200 Nicotine dependence, unspecified, uncomplicated: Secondary | ICD-10-CM | POA: Diagnosis not present

## 2021-04-18 DIAGNOSIS — E782 Mixed hyperlipidemia: Secondary | ICD-10-CM

## 2021-04-18 DIAGNOSIS — Z13820 Encounter for screening for osteoporosis: Secondary | ICD-10-CM | POA: Diagnosis not present

## 2021-04-18 DIAGNOSIS — Z23 Encounter for immunization: Secondary | ICD-10-CM | POA: Diagnosis not present

## 2021-04-18 DIAGNOSIS — F418 Other specified anxiety disorders: Secondary | ICD-10-CM

## 2021-04-18 LAB — POCT URINALYSIS DIPSTICK
Bilirubin, UA: NEGATIVE
Blood, UA: NEGATIVE
Glucose, UA: NEGATIVE
Ketones, UA: NEGATIVE
Leukocytes, UA: NEGATIVE
Nitrite, UA: NEGATIVE
Protein, UA: NEGATIVE
Spec Grav, UA: 1.02 (ref 1.010–1.025)
Urobilinogen, UA: 0.2 E.U./dL
pH, UA: 6 (ref 5.0–8.0)

## 2021-04-18 MED ORDER — LISINOPRIL 10 MG PO TABS: 10.0000 mg | ORAL_TABLET | Freq: Every day | ORAL | 1 refills | Status: DC

## 2021-04-18 MED ORDER — ESCITALOPRAM OXALATE 20 MG PO TABS: 20.0000 mg | ORAL_TABLET | Freq: Every day | ORAL | 1 refills | Status: DC

## 2021-04-18 MED ORDER — SHINGRIX 50 MCG/0.5ML IM SUSR: 0.5000 mL | Freq: Once | INTRAMUSCULAR | 1 refills | Status: AC

## 2021-04-18 NOTE — Progress Notes (Signed)
Date:  04/18/2021   Name:  Tiffany Bonilla   DOB:  [birth year]   MRN:  [id number]   Chief Complaint: Annual Exam (Breast exam no pap) Tamy Accardo is a 65 y.o. female who presents today for her Complete Annual Exam. She feels well. She reports exercising walking 1 mile daily. She reports she is sleeping well. Breast complaints none.  Mammogram: 08/2015 DEXA: none Pap smear: discontinued Colonoscopy: none - Cologuard in 2021 no results - per [employer] she may have mailed it incorrectly  Immunization History  Administered Date(s) Administered   Fluad Quad(high Dose 65+) 10/29/2020   Influenza,inj,Quad PF,6+ Mos 07/13/2019   Influenza-Unspecified 06/28/2013, 06/18/2015   Moderna Sars-Covid-2 Vaccination 09/14/2019, 10/24/2019, 10/02/2020   Pneumococcal Conjugate-13 10/29/2020   Pneumococcal Polysaccharide-23 06/29/2015, 11/25/2018   Pneumococcal-Unspecified 01/13/2011   Tdap 10/31/2010    Hypertension This is a chronic problem. The problem is controlled. Pertinent negatives include no chest pain, headaches, palpitations or shortness of breath. Past treatments include ACE inhibitors (hctz stopped at ED due to presyncope). The current treatment provides significant improvement. Hypertensive end-organ damage includes CAD/MI.  Hyperlipidemia This is a chronic problem. The problem is controlled. Recent lipid tests were reviewed and are normal. Pertinent negatives include no chest pain or shortness of breath. Current antihyperlipidemic treatment includes statins (stopped recently - no side effects and willing to resume). The current treatment provides significant improvement of lipids.  Depression        This is a chronic problem.  The problem has been gradually improving since onset.  Associated symptoms include no fatigue and no headaches.  Past treatments include SSRIs - Selective serotonin reuptake inhibitors (dose of lexapro increased last visit).  Lab Results   Component Value Date   CREATININE 1.16 (H) 04/09/2020   BUN 20 04/09/2020   NA 140 04/09/2020   K 3.8 04/09/2020   CL 99 04/09/2020   CO2 26 04/09/2020   Lab Results  Component Value Date   CHOL 152 04/09/2020   HDL 64 04/09/2020   LDLCALC 74 04/09/2020   TRIG 74 04/09/2020   CHOLHDL 2.4 04/09/2020   Lab Results  Component Value Date   TSH 1.300 04/09/2020   Lab Results  Component Value Date   HGBA1C 5.5 02/16/2019   Lab Results  Component Value Date   WBC 9.3 04/09/2020   HGB 15.7 04/09/2020   HCT 47.7 (H) 04/09/2020   MCV 95 04/09/2020   PLT 202 04/09/2020   Lab Results  Component Value Date   ALT 21 04/09/2020   AST 26 04/09/2020   ALKPHOS 46 (L) 04/09/2020   BILITOT 0.4 04/09/2020  Last vitamin D Lab Results  Component Value Date   VD25OH 37.0 04/09/2020      Review of Systems  Constitutional:  Negative for chills, fatigue and fever.  HENT:  Negative for congestion, hearing loss, tinnitus, trouble swallowing and voice change.   Eyes:  Negative for visual disturbance.  Respiratory:  Negative for cough, chest tightness, shortness of breath and wheezing.   Cardiovascular:  Negative for chest pain, palpitations and leg swelling.  Gastrointestinal:  Negative for abdominal pain, constipation, diarrhea and vomiting.  Endocrine: Negative for polydipsia and polyuria.  Genitourinary:  Negative for dysuria, frequency, genital sores, vaginal bleeding and vaginal discharge.  Musculoskeletal:  Negative for arthralgias, gait problem and joint swelling.  Skin:  Negative for color change and rash.  Neurological:  Negative for dizziness, tremors, light-headedness and headaches.  Hematological:  Negative for  adenopathy. Does not bruise/bleed easily.  Psychiatric/Behavioral:  Positive for depression. Negative for dysphoric mood and sleep disturbance. The patient is not nervous/anxious.    Patient Active Problem List   Diagnosis Date Noted   Diarrhea 04/07/2021    Elevated troponin 04/07/2021   Postural dizziness with presyncope 04/07/2021   Atrophic kidney 09/27/2020   Aortic aneurysm, abdominal ([place]) 09/27/2020   Benign essential tremor 04/09/2020   Vitamin D deficiency 09/06/2019   Other specified anxiety disorders 81/19/1478   Lichen sclerosus et atrophicus of the vulva 11/25/2018   Essential hypertension 11/24/2018   Old lacunar stroke without late effect 11/24/2018   Current every day smoker 10/25/2015   Hx of hepatitis C 10/25/2015   CAD in native artery 05/24/2015   Mixed hyperlipidemia 06/12/2011    Allergies  Allergen Reactions   Morphine Hives and Shortness Of Breath    Stopped breathing    Scopolamine Other (See Comments)    Unconscious    Codeine Nausea Only and Other (See Comments)    NAUSEA/VOMITING    Sulfa Antibiotics Hives and Nausea Only    Other Reaction: Not Assessed     Past Surgical History:  Procedure Laterality Date   CARDIAC ELECTROPHYSIOLOGY STUDY AND ABLATION  2006   for SVT   CATARACT EXTRACTION W/PHACO Right 02/06/2021   Procedure: CATARACT EXTRACTION PHACO AND INTRAOCULAR LENS PLACEMENT ([place]) RIGHT;  Surgeon: Leandrew Koyanagi, MD;  Location: [facility];  Service: Ophthalmology;  Laterality: Right;  2.85 0:32.6 8.7%   CATARACT EXTRACTION W/PHACO Left 02/20/2021   Procedure: CATARACT EXTRACTION PHACO AND INTRAOCULAR LENS PLACEMENT ([place]) LEFT;  Surgeon: Leandrew Koyanagi, MD;  Location: [facility];  Service: Ophthalmology;  Laterality: Left;  3.12 0:39.5   COLONOSCOPY  2006   CORONARY ANGIOPLASTY WITH STENT PLACEMENT  2001   to Circumfx   ESOPHAGOGASTRODUODENOSCOPY  2017   LAPAROSCOPIC CHOLECYSTECTOMY  04/25/2019   @ [facility]   LAPAROSCOPIC GASTRIC SLEEVE RESECTION  11/07/2015   TOTAL ABDOMINAL HYSTERECTOMY  1992   CIS cervix    Social History   Tobacco Use   Smoking status: Every Day    Packs/day: 0.50    Years: 40.00    Pack years: 20.00    Types: Cigarettes    Start  date: 1980   Smokeless tobacco: Never  Vaping Use   Vaping Use: Never used  Substance Use Topics   Alcohol use: Yes    Comment: rare   Drug use: Never     Medication list has been reviewed and updated.  Current Meds  Medication Sig   aspirin EC 81 MG tablet Take 1 tablet by mouth daily.   Black Elderberry 50 MG/5ML SYRP Take by mouth.   cholecalciferol (VITAMIN D3) 25 MCG (1000 UNIT) tablet Take 2,000 Units by mouth daily.   clobetasol cream (TEMOVATE) 0.05 % Apply topically 2 (two) times daily. to affected area   escitalopram (LEXAPRO) 20 MG tablet Take 1 tablet (20 mg total) by mouth daily.   gabapentin (NEURONTIN) 100 MG capsule TAKE 2 CAPSULES(200 MG) BY MOUTH IN THE MORNING AND AT BEDTIME FOR TREMORS   lisinopril (ZESTRIL) 10 MG tablet Take 1 tablet (10 mg total) by mouth daily.   Melatonin 10 MG TABS Take 1 tablet by mouth at bedtime.   Multiple Vitamins-Minerals (ALIVE WOMENS ENERGY) TABS Take by mouth.    PHQ 2/9 Scores 04/18/2021 04/09/2021 03/11/2021 10/29/2020  PHQ - 2 Score 0 0 0 0  PHQ- 9 Score _0 0  GAD 7 : Generalized Anxiety Score 04/18/2021 04/09/2021 03/11/2021 10/29/2020  Nervous, Anxious, on Edge 0 1 0 0  Control/stop worrying 0 2 0 0  Worry too much - different things 1 2 0 0  Trouble relaxing 0 2 0 0  Restless 0 0 0 0  Easily annoyed or irritable 0 0 0 0  Afraid - awful might happen 0 0 0 0  Total GAD 7 Score 1 7 0 0  Anxiety Difficulty - - Not difficult at all Not difficult at all    BP Readings from Last 3 Encounters:  04/18/21 128/78  04/09/21 (!) 142/84  03/11/21 130/70    Physical Exam Vitals and nursing note reviewed.  Constitutional:      General: She is not in acute distress.    Appearance: She is well-developed.  HENT:     Head: Normocephalic and atraumatic.     Right Ear: Tympanic membrane and ear canal normal.     Left Ear: Tympanic membrane and ear canal normal.     Nose:     Right Sinus: No maxillary sinus tenderness.     Left  Sinus: No maxillary sinus tenderness.  Eyes:     General: No scleral icterus.       Right eye: No discharge.        Left eye: No discharge.     Conjunctiva/sclera: Conjunctivae normal.  Neck:     Thyroid: No thyromegaly.     Vascular: No carotid bruit.  Cardiovascular:     Rate and Rhythm: Normal rate and regular rhythm.     Pulses: Normal pulses.     Heart sounds: Normal heart sounds.  Pulmonary:     Effort: Pulmonary effort is normal. No respiratory distress.     Breath sounds: No wheezing.  Chest:  Breasts:    Right: No mass, nipple discharge, skin change or tenderness.     Left: No mass, nipple discharge, skin change or tenderness.  Abdominal:     General: Bowel sounds are normal.     Palpations: Abdomen is soft.     Tenderness: There is no abdominal tenderness.  Musculoskeletal:     Cervical back: Normal range of motion. No erythema.     Right lower leg: No edema.     Left lower leg: No edema.  Lymphadenopathy:     Cervical: No cervical adenopathy.  Skin:    General: Skin is warm and dry.     Findings: No rash.  Neurological:     Mental Status: She is alert and oriented to person, place, and time.     Cranial Nerves: No cranial nerve deficit.     Sensory: No sensory deficit.     Deep Tendon Reflexes: Reflexes are normal and symmetric.  Psychiatric:        Attention and Perception: Attention normal.        Mood and Affect: Mood normal.    Wt Readings from Last 3 Encounters:  04/18/21 132 lb (59.9 kg)  04/09/21 133 lb (60.3 kg)  03/11/21 135 lb (61.2 kg)    BP 128/78   Pulse 79   Temp 98.6 F (37 C) (Oral)   Ht _0  (1.575 m)   Wt 132 lb (59.9 kg)   SpO2 94%   BMI 24.14 kg/m   Assessment and Plan: 1. Annual physical exam Normal exam Continue healthy diet, weight control Recommended vaccination are up to date - TSH  2. Encounter for screening mammogram for breast cancer  Pt encouraged to schedule mammogram ASAP - MM 3D SCREEN BREAST  BILATERAL  3. Colon cancer screening Will repeat Cologuard kit - pt reminded on return instructions  4. Encounter for screening for osteoporosis Due for DEXA - DG Bone Density  5. Current every day smoker She qualifies for LDCT screening - will contact her   6. Essential hypertension Clinically stable exam with well controlled BP. Hypotension and presyncope resolved after stopping HCTZ Tolerating medications without side effects at this time. Pt to continue current regimen and low sodium diet; benefits of regular exercise as able discussed. - CBC with Differential/Platelet - Comprehensive metabolic panel - POCT urinalysis dipstick - lisinopril (ZESTRIL) 10 MG tablet; Take 1 tablet (10 mg total) by mouth daily.  Dispense: 90 tablet; Refill: 1  7. CAD in native artery Stable, on ASA and statin  8. Mixed hyperlipidemia Tolerating statin medication without side effects at this time.  This was stopped briefly during episode of low BP She will resume. LDL is at goal of < 70 on current dose Continue same therapy without change at this time. - Lipid panel  9. Abdominal aortic aneurysm (AAA) without rupture ([place]) Last imaged 2021 - stable dilatation 3.2 cm Recommend re-image in 2 more years Continue to monitor BP and maintain good control  10. Other specified anxiety disorders Clinically stable on current regimen with good control of symptoms, No SI or HI.  Recent increase in dose has been very beneficial. Will continue current therapy. - escitalopram (LEXAPRO) 20 MG tablet; Take 1 tablet (20 mg total) by mouth daily.  Dispense: 90 tablet; Refill: 1  11. Need for shingles vaccine Pt will consider obtaining this from her pharmacy. - Zoster Vaccine Adjuvanted [facility]) injection; Inject 0.5 mLs into the muscle once for 1 dose.  Dispense: 0.5 mL; Refill: 1   Partially dictated using Editor, commissioning. Any errors are unintentional.  Halina Maidens, MD [facility]  04/18/2021

## 2021-04-19 ENCOUNTER — Encounter: Payer: Self-pay | Admitting: Internal Medicine

## 2021-04-19 ENCOUNTER — Telehealth: Payer: Self-pay | Admitting: Internal Medicine

## 2021-04-19 DIAGNOSIS — N1832 Chronic kidney disease, stage 3b: Secondary | ICD-10-CM | POA: Insufficient documentation

## 2021-04-19 LAB — COMPREHENSIVE METABOLIC PANEL
ALT: 11 IU/L (ref 0–32)
AST: 19 IU/L (ref 0–40)
Albumin/Globulin Ratio: 2.1 (ref 1.2–2.2)
Albumin: 4.5 g/dL (ref 3.8–4.8)
Alkaline Phosphatase: 50 IU/L (ref 44–121)
BUN/Creatinine Ratio: 17 (ref 12–28)
BUN: 24 mg/dL (ref 8–27)
Bilirubin Total: 0.4 mg/dL (ref 0.0–1.2)
CO2: 24 mmol/L (ref 20–29)
Calcium: 9.8 mg/dL (ref 8.7–10.3)
Chloride: 99 mmol/L (ref 96–106)
Creatinine, Ser: 1.44 mg/dL — ABNORMAL HIGH (ref 0.57–1.00)
Globulin, Total: 2.1 g/dL (ref 1.5–4.5)
Glucose: 90 mg/dL (ref 65–99)
Potassium: 3.8 mmol/L (ref 3.5–5.2)
Sodium: 139 mmol/L (ref 134–144)
Total Protein: 6.6 g/dL (ref 6.0–8.5)
eGFR: 40 mL/min/{1.73_m2} — ABNORMAL LOW (ref >59)

## 2021-04-19 LAB — CBC WITH DIFFERENTIAL/PLATELET
Basophils Absolute: 0.2 10*3/uL (ref 0.0–0.2)
Basos: 2 %
EOS (ABSOLUTE): 0.7 10*3/uL — ABNORMAL HIGH (ref 0.0–0.4)
Eos: 7 %
Hematocrit: 43.7 % (ref 34.0–46.6)
Hemoglobin: 15 g/dL (ref 11.1–15.9)
Immature Grans (Abs): 0 10*3/uL (ref 0.0–0.1)
Immature Granulocytes: 0 %
Lymphocytes Absolute: 2.5 10*3/uL (ref 0.7–3.1)
Lymphs: 26 %
MCH: 31.3 pg (ref 26.6–33.0)
MCHC: 34.3 g/dL (ref 31.5–35.7)
MCV: 91 fL (ref 79–97)
Monocytes Absolute: 0.8 10*3/uL (ref 0.1–0.9)
Monocytes: 9 %
Neutrophils Absolute: 5.4 10*3/uL (ref 1.4–7.0)
Neutrophils: 56 %
Platelets: 226 10*3/uL (ref 150–450)
RBC: 4.8 x10E6/uL (ref 3.77–5.28)
RDW: 12.2 % (ref 11.7–15.4)
WBC: 9.7 10*3/uL (ref 3.4–10.8)

## 2021-04-19 LAB — LIPID PANEL
Chol/HDL Ratio: 4 ratio (ref 0.0–4.4)
Cholesterol, Total: 219 mg/dL — ABNORMAL HIGH (ref 100–199)
HDL: 55 mg/dL (ref >39)
LDL Chol Calc (NIH): 142 mg/dL — ABNORMAL HIGH (ref 0–99)
Triglycerides: 122 mg/dL (ref 0–149)
VLDL Cholesterol Cal: 22 mg/dL (ref 5–40)

## 2021-04-19 LAB — TSH: TSH: 1.13 u[IU]/mL (ref 0.450–4.500)

## 2021-04-19 NOTE — Telephone Encounter (Signed)
Pt is calling back for Dr. Gaspar Cola nurse. Pt states that Dr. Army Melia previously ordered a lung screening for her at [facility]. Please advice CB- [phone]

## 2021-05-01 ENCOUNTER — Other Ambulatory Visit: Payer: Self-pay | Admitting: *Deleted

## 2021-05-01 ENCOUNTER — Inpatient Hospital Stay
Admission: RE | Admit: 2021-05-01 | Discharge: 2021-05-01 | Disposition: A | Discharge status: Home / Self Care | Payer: Self-pay | Source: Ambulatory Visit | Attending: *Deleted | Admitting: *Deleted

## 2021-05-01 ENCOUNTER — Ambulatory Visit: Payer: Medicare HMO | Admitting: Internal Medicine

## 2021-05-01 ENCOUNTER — Other Ambulatory Visit: Payer: Self-pay

## 2021-05-01 ENCOUNTER — Ambulatory Visit
Admission: RE | Admit: 2021-05-01 | Discharge: 2021-05-01 | Disposition: A | Discharge status: Home / Self Care | Payer: Medicare HMO | Source: Ambulatory Visit | Attending: Internal Medicine | Admitting: Internal Medicine

## 2021-05-01 DIAGNOSIS — Z13820 Encounter for screening for osteoporosis: Secondary | ICD-10-CM | POA: Diagnosis not present

## 2021-05-01 DIAGNOSIS — Z780 Asymptomatic menopausal state: Secondary | ICD-10-CM | POA: Insufficient documentation

## 2021-05-01 DIAGNOSIS — Z1231 Encounter for screening mammogram for malignant neoplasm of breast: Secondary | ICD-10-CM | POA: Insufficient documentation

## 2021-05-01 DIAGNOSIS — J449 Chronic obstructive pulmonary disease, unspecified: Secondary | ICD-10-CM | POA: Insufficient documentation

## 2021-05-01 DIAGNOSIS — M85832 Other specified disorders of bone density and structure, left forearm: Secondary | ICD-10-CM | POA: Insufficient documentation

## 2021-05-03 ENCOUNTER — Other Ambulatory Visit: Payer: Self-pay | Admitting: Internal Medicine

## 2021-05-03 DIAGNOSIS — I10 Essential (primary) hypertension: Secondary | ICD-10-CM

## 2021-05-03 NOTE — Telephone Encounter (Signed)
Medication Refill - Medication: lisinopril (ZESTRIL) 10 MG tablet   Has the patient contacted their pharmacy? yes (Agent: If no, request that the patient contact the pharmacy for the refill.) (Agent: If yes, when and what did the pharmacy advise?)contact pcp  Preferred Pharmacy (with phone number or street name):  [id number] - [place], [place] [address] AT [place] Phone:  [phone]  Fax:  [phone]      Agent: Please be advised that RX refills may take up to 3 business days. We ask that you follow-up with your pharmacy.

## 2021-05-04 MED ORDER — LISINOPRIL 10 MG PO TABS: 10.0000 mg | ORAL_TABLET | Freq: Every day | ORAL | 1 refills | Status: DC

## 2021-05-23 ENCOUNTER — Ambulatory Visit
Admission: EM | Admit: 2021-05-23 | Discharge: 2021-05-23 | Disposition: A | Discharge status: Home / Self Care | Payer: Medicare HMO | Attending: Emergency Medicine | Admitting: Emergency Medicine

## 2021-05-23 ENCOUNTER — Other Ambulatory Visit: Payer: Self-pay

## 2021-05-23 ENCOUNTER — Encounter: Payer: Self-pay | Admitting: Emergency Medicine

## 2021-05-23 DIAGNOSIS — B9689 Other specified bacterial agents as the cause of diseases classified elsewhere: Secondary | ICD-10-CM | POA: Diagnosis not present

## 2021-05-23 DIAGNOSIS — H109 Unspecified conjunctivitis: Secondary | ICD-10-CM | POA: Diagnosis not present

## 2021-05-23 MED ORDER — POLYMYXIN B-TRIMETHOPRIM 10000-0.1 UNIT/ML-% OP SOLN: 1.0000 [drp] | OPHTHALMIC | 0 refills | Status: DC

## 2021-05-23 NOTE — Discharge Instructions (Addendum)
Today you being treated for bacterial conjunctivitis.   Place one drop of polytrim into the effected eye every 4 hours while awake for 7 days. If the other eye starts to have symptoms you may use medication in it as well. Do not allow tip of dropper to touch eye. May use cool compress for comfort and to remove discharge if present. Pat the eye, do not wipe.  If wearing contacts, dispose of current pair. Wear glasses until symptoms have resolved.   Do not rub eyes, this may cause more irritation.  May use benadryl as needed to help if itching present.  Please avoid use of eye makeup until symptoms clear.  If symptoms persist after use of medication, please follow up at Urgent Care or with ophthalmologist (eye doctor)  

## 2021-05-23 NOTE — ED Provider Notes (Signed)
[facility]    CSN: [id number] Arrival date & time: 05/23/21  1107      History   Chief Complaint Chief Complaint  Patient presents with   Eye Problem    bilateral    HPI Tiffany Bonilla is a 65 y.o. female.   Patient presents with bilateral eye redness, itching, discharge, photosensitivity blurred vision described as a feeling sitting over her eyes 1 day.  Endorses that she woke up this morning with her eyes matted together.  Attempted use of over-the-counter Visine with no relief.  Has been rubbing eyes.  Does not use contacts and has stopped using eye make-up once symptoms occurred.  History of allergies, asthma, prediabetic, CVA, hypertension, hyperlipidemia.  Had bilateral cataracts removed in June 6433, no complications.  Past Medical History:  Diagnosis Date   Allergy    Asthma    Hyperlipidemia    Hypertension    Pre-diabetes 11/24/2018   Spina bifida occulta    Stroke [facility])    SVT (supraventricular tachycardia) ([place]) 11/24/2018   S/p ablation at [facility]   Wears dentures    full upper    Patient Active Problem List   Diagnosis Date Noted   Chronic kidney disease, stage 3b ([place]) 04/19/2021   Diarrhea 04/07/2021   Elevated troponin 04/07/2021   Postural dizziness with presyncope 04/07/2021   Atrophic kidney 09/27/2020   Aortic aneurysm, abdominal ([place]) 09/27/2020   Benign essential tremor 04/09/2020   Vitamin D deficiency 09/06/2019   Other specified anxiety disorders 29/51/8841   Lichen sclerosus et atrophicus of the vulva 11/25/2018   Essential hypertension 11/24/2018   Old lacunar stroke without late effect 11/24/2018   Current every day smoker 10/25/2015   Hx of hepatitis C 10/25/2015   CAD in native artery 05/24/2015   Mixed hyperlipidemia 06/12/2011    Past Surgical History:  Procedure Laterality Date   CARDIAC ELECTROPHYSIOLOGY STUDY AND ABLATION  2006   for SVT   CATARACT EXTRACTION W/PHACO Right 02/06/2021   Procedure: CATARACT  EXTRACTION PHACO AND INTRAOCULAR LENS PLACEMENT ([place]) RIGHT;  Surgeon: Leandrew Koyanagi, MD;  Location: [place] [facility];  Service: Ophthalmology;  Laterality: Right;  2.85 0:32.6 8.7%   CATARACT EXTRACTION W/PHACO Left 02/20/2021   Procedure: CATARACT EXTRACTION PHACO AND INTRAOCULAR LENS PLACEMENT ([place]) LEFT;  Surgeon: Leandrew Koyanagi, MD;  Location: [facility];  Service: Ophthalmology;  Laterality: Left;  3.12 0:39.5   COLONOSCOPY  2006   CORONARY ANGIOPLASTY WITH STENT PLACEMENT  2001   to Circumfx   ESOPHAGOGASTRODUODENOSCOPY  2017   LAPAROSCOPIC CHOLECYSTECTOMY  04/25/2019   @ [facility]   LAPAROSCOPIC GASTRIC SLEEVE RESECTION  11/07/2015   TOTAL ABDOMINAL HYSTERECTOMY  1992   CIS cervix    OB History   No obstetric history on file.      Home Medications    Prior to Admission medications   Medication Sig Start Date End Date Taking? Authorizing Provider  aspirin EC 81 MG tablet Take 1 tablet by mouth daily.   Yes [provider]  Black Elderberry 50 MG/5ML SYRP Take by mouth.   Yes [provider]  cholecalciferol (VITAMIN D3) 25 MCG (1000 UNIT) tablet Take 2,000 Units by mouth daily.   Yes [provider]  escitalopram (LEXAPRO) 20 MG tablet Take 1 tablet (20 mg total) by mouth daily. 04/18/21  Yes Glean Hess, MD  gabapentin (NEURONTIN) 100 MG capsule TAKE 2 CAPSULES(200 MG) BY MOUTH IN THE MORNING AND AT BEDTIME FOR TREMORS 10/29/20  Yes Glean Hess, MD  lisinopril (ZESTRIL) 10 MG tablet Take 1 tablet (10 mg total) by mouth daily. 05/04/21  Yes Glean Hess, MD  Melatonin 10 MG TABS Take 1 tablet by mouth at bedtime.   Yes [provider]  Multiple Vitamins-Minerals (ALIVE WOMENS ENERGY) TABS Take by mouth.   Yes [provider]  rosuvastatin (CRESTOR) 40 MG tablet Take 1 tablet (40 mg total) by mouth daily. 10/29/20  Yes Glean Hess, MD  trimethoprim-polymyxin b (POLYTRIM) ophthalmic solution  Place 1 drop into both eyes every 4 (four) hours. 05/23/21  Yes Aeriel Boulay R, NP  clobetasol cream (TEMOVATE) 0.05 % Apply topically 2 (two) times daily. to affected area 06/15/20   Glean Hess, MD    Family History Family History  Problem Relation Age of Onset   Aneurysm Mother    Breast cancer Maternal Aunt     Social History Social History   Tobacco Use   Smoking status: Every Day    Packs/day: 0.50    Years: 40.00    Pack years: 20.00    Types: Cigarettes    Start date: 1980   Smokeless tobacco: Never  Vaping Use   Vaping Use: Never used  Substance Use Topics   Alcohol use: Yes    Comment: rare   Drug use: Never     Allergies   Morphine, Scopolamine, Codeine, and Sulfa antibiotics   Review of Systems Review of Systems  Constitutional: Negative.   HENT: Negative.    Eyes:  Positive for photophobia, discharge, redness, itching and visual disturbance. Negative for pain.  Respiratory: Negative.    Cardiovascular: Negative.   Skin: Negative.   Neurological: Negative.     Physical Exam Triage Vital Signs ED Triage Vitals  Enc Vitals Group     BP 05/23/21 1121 133/60     Pulse Rate 05/23/21 1121 70     Resp 05/23/21 1121 18     Temp 05/23/21 1121 98.2 F (36.8 C)     Temp Source 05/23/21 1121 Oral     SpO2 05/23/21 1121 97 %     Weight 05/23/21 1119 132 lb 0.9 oz (59.9 kg)     Height 05/23/21 1119 5\' 2"  (1.575 m)     Head Circumference --      Peak Flow --      Pain Score 05/23/21 1118 0     Pain Loc --      Pain Edu? --      Excl. in [place]? --    No data found.  Updated Vital Signs BP 133/60 (BP Location: Left Arm)   Pulse 70   Temp 98.2 F (36.8 C) (Oral)   Resp 18   Ht 5\' 2"  (1.575 m)   Wt 132 lb 0.9 oz (59.9 kg)   SpO2 97%   BMI 24.15 kg/m   Visual Acuity Right Eye Distance: 20/50 uncorrected Left Eye Distance: 20/40 uncorrected Bilateral Distance: 20/50 uncorrected  Right Eye Near:   Left Eye Near:    Bilateral Near:      Physical Exam Constitutional:      Appearance: Normal appearance. She is normal weight.  HENT:     Head: Normocephalic.  Eyes:     Comments: Bilateral redness along the conjunctiva, no discharge present, extraocular movements intact, vision intact  Pulmonary:     Effort: Pulmonary effort is normal.  Skin:    General: Skin is warm and dry.  Neurological:  Mental Status: She is alert and oriented to person, place, and time. Mental status is at baseline.  Psychiatric:        Mood and Affect: Mood normal.        Behavior: Behavior normal.     UC Treatments / Results  Labs (all labs ordered are listed, but only abnormal results are displayed) Labs Reviewed - No data to display  EKG   Radiology No results found.  Procedures Procedures (including critical care time)  Medications Ordered in UC Medications - No data to display  Initial Impression / Assessment and Plan / UC Course  I have reviewed the triage vital signs and the nursing notes.  Pertinent labs & imaging results that were available during my care of the patient were reviewed by me and considered in my medical decision making (see chart for details).  Bacterial congenital to Vitas of both eyes  1.  Polytrim 1 drop each eye every 4 hours for 7 days 2.  Cool compresses for comfort, advised against rubbing eye and use of eye make-up until symptoms have resolved 3.  Over-the-counter antihistamine for itching 4.  Follow-up with ophthalmology advised for persistent symptoms  Final Clinical Impressions(s) / UC Diagnoses   Final diagnoses:  Bacterial conjunctivitis of both eyes     Discharge Instructions      Today you being treated for bacterial conjunctivitis.   Place one drop of polytrim into the effected eye every 4 hours while awake for 7 days. If the other eye starts to have symptoms you may use medication in it as well. Do not allow tip of dropper to touch eye. May use cool compress for comfort  and to remove discharge if present. Pat the eye, do not wipe.  If wearing contacts, dispose of current pair. Wear glasses until symptoms have resolved.   Do not rub eyes, this may cause more irritation.  May use benadryl as needed to help if itching present.  Please avoid use of eye makeup until symptoms clear.  If symptoms persist after use of medication, please follow up at Urgent Care or with ophthalmologist (eye doctor)    ED Prescriptions     Medication Sig Dispense Auth. Provider   trimethoprim-polymyxin b (POLYTRIM) ophthalmic solution Place 1 drop into both eyes every 4 (four) hours. 10 mL Hans Eden, NP      PDMP not reviewed this encounter.   Hans Eden, NP 05/23/21 1148

## 2021-05-23 NOTE — ED Triage Notes (Signed)
Pt c/o bilateral eye redness. Started yesterday. She woke up this morning with her eyes matted. She states her eyes are itchy, and sensitive to light.

## 2021-06-19 ENCOUNTER — Ambulatory Visit
Admission: RE | Admit: 2021-06-19 | Discharge: 2021-06-19 | Disposition: A | Discharge status: Home / Self Care | Payer: Medicare HMO | Source: Ambulatory Visit | Attending: Internal Medicine | Admitting: Internal Medicine

## 2021-06-19 ENCOUNTER — Encounter: Payer: Self-pay | Admitting: Internal Medicine

## 2021-06-19 ENCOUNTER — Ambulatory Visit
Admission: RE | Admit: 2021-06-19 | Discharge: 2021-06-19 | Disposition: A | Discharge status: Home / Self Care | Payer: Medicare HMO | Attending: Internal Medicine | Admitting: Internal Medicine

## 2021-06-19 ENCOUNTER — Other Ambulatory Visit: Payer: Self-pay

## 2021-06-19 ENCOUNTER — Ambulatory Visit (INDEPENDENT_AMBULATORY_CARE_PROVIDER_SITE_OTHER): Payer: Medicare HMO | Admitting: Internal Medicine

## 2021-06-19 VITALS — BP 132/84 | HR 76 | Ht 62.0 in | Wt 137.2 lb

## 2021-06-19 DIAGNOSIS — M545 Low back pain, unspecified: Secondary | ICD-10-CM | POA: Diagnosis not present

## 2021-06-19 DIAGNOSIS — G8929 Other chronic pain: Secondary | ICD-10-CM

## 2021-06-19 DIAGNOSIS — M1611 Unilateral primary osteoarthritis, right hip: Secondary | ICD-10-CM | POA: Diagnosis not present

## 2021-06-19 DIAGNOSIS — M25551 Pain in right hip: Secondary | ICD-10-CM | POA: Diagnosis not present

## 2021-06-19 MED ORDER — PREDNISONE 10 MG PO TABS
10.0000 mg | ORAL_TABLET | ORAL | 0 refills | Status: AC
Start: 2021-06-19 — End: 2021-06-25

## 2021-06-19 NOTE — Progress Notes (Signed)
Date:  06/19/2021   Name:  Tiffany Bonilla   DOB:  11-Sep-1955   MRN:  [id number]   Chief Complaint: Hip Pain (Fall 5 months ago. Broke her ankle and hurt her leg. Pain is in hip down into groin. Has no strength in right leg. 2 days ago her leg gave out of her stepping up on stairs. Hurts with bearing weight and walking.) Pt had foot fracture at work 6 months ago.  Treated with a boot for 3 months and did not require surgery. Right hip started to be painful several months ago before the recent fall. No pain sitting but standing and walking hurt in the right groin and right buttock.  Her leg also feels weak.  Hip Pain  The incident occurred more than 1 week ago. The injury mechanism was a fall. The pain is present in the right hip (radiates down into right groin). The pain is at a severity of 8/10. The pain is severe. The pain has been Worsening since onset. Associated symptoms include an inability to bear weight, a loss of motion and muscle weakness. Associated symptoms comments: Difficulty climbing stairs, and sleeping on her right side. She reports no foreign bodies present. The symptoms are aggravated by movement, palpation and weight bearing.  Back Pain This is a recurrent problem. The problem has been gradually worsening since onset. The pain is present in the lumbar spine. The quality of the pain is described as aching. The pain is mild (and right leg weakness). Associated symptoms include weakness. Pertinent negatives include no chest pain, fever or headaches. Treatments tried: gabapentin.   Lab Results  Component Value Date   CREATININE 1.44 (H) 04/18/2021   BUN 24 04/18/2021   NA 139 04/18/2021   K 3.8 04/18/2021   CL 99 04/18/2021   CO2 24 04/18/2021   Lab Results  Component Value Date   CHOL 219 (H) 04/18/2021   HDL 55 04/18/2021   LDLCALC 142 (H) 04/18/2021   TRIG 122 04/18/2021   CHOLHDL 4.0 04/18/2021   Lab Results  Component Value Date   TSH 1.130 04/18/2021    Lab Results  Component Value Date   HGBA1C 5.5 02/16/2019   Lab Results  Component Value Date   WBC 9.7 04/18/2021   HGB 15.0 04/18/2021   HCT 43.7 04/18/2021   MCV 91 04/18/2021   PLT 226 04/18/2021   Lab Results  Component Value Date   ALT 11 04/18/2021   AST 19 04/18/2021   ALKPHOS 50 04/18/2021   BILITOT 0.4 04/18/2021     Review of Systems  Constitutional:  Negative for chills, fatigue and fever.  Respiratory:  Negative for cough, chest tightness and shortness of breath.   Cardiovascular:  Negative for chest pain.  Musculoskeletal:  Positive for arthralgias, back pain and gait problem.  Neurological:  Positive for tremors and weakness. Negative for dizziness and headaches.   Patient Active Problem List   Diagnosis Date Noted   Chronic kidney disease, stage 3b ([place]) 04/19/2021   Diarrhea 04/07/2021   Elevated troponin 04/07/2021   Postural dizziness with presyncope 04/07/2021   Atrophic kidney 09/27/2020   Aortic aneurysm, abdominal 09/27/2020   Benign essential tremor 04/09/2020   Vitamin D deficiency 09/06/2019   Other specified anxiety disorders 09/09/3233   Lichen sclerosus et atrophicus of the vulva 11/25/2018   Essential hypertension 11/24/2018   Old lacunar stroke without late effect 11/24/2018   Current every day smoker 10/25/2015   Hx of  hepatitis C 10/25/2015   CAD in native artery 05/24/2015   Mixed hyperlipidemia 06/12/2011    Allergies  Allergen Reactions   Morphine Hives and Shortness Of Breath    Stopped breathing    Scopolamine Other (See Comments)    Unconscious    Codeine Nausea Only and Other (See Comments)    NAUSEA/VOMITING    Sulfa Antibiotics Hives and Nausea Only    Other Reaction: Not Assessed     Past Surgical History:  Procedure Laterality Date   CARDIAC ELECTROPHYSIOLOGY STUDY AND ABLATION  2006   for SVT   CATARACT EXTRACTION W/PHACO Right 02/06/2021   Procedure: CATARACT EXTRACTION PHACO AND INTRAOCULAR LENS  PLACEMENT ([place]) RIGHT;  Surgeon: Leandrew Koyanagi, MD;  Location: [place];  Service: Ophthalmology;  Laterality: Right;  2.85 0:32.6 8.7%   CATARACT EXTRACTION W/PHACO Left 02/20/2021   Procedure: CATARACT EXTRACTION PHACO AND INTRAOCULAR LENS PLACEMENT ([place]) LEFT;  Surgeon: Leandrew Koyanagi, MD;  Location: [place];  Service: Ophthalmology;  Laterality: Left;  3.12 0:39.5   COLONOSCOPY  2006   CORONARY ANGIOPLASTY WITH STENT PLACEMENT  2001   to [facility]   ESOPHAGOGASTRODUODENOSCOPY  2017   LAPAROSCOPIC CHOLECYSTECTOMY  04/25/2019   @ [facility]   LAPAROSCOPIC GASTRIC SLEEVE RESECTION  11/07/2015   TOTAL ABDOMINAL HYSTERECTOMY  1992   CIS cervix    Social History   Tobacco Use   Smoking status: Every Day    Packs/day: 0.50    Years: 40.00    Pack years: 20.00    Types: Cigarettes    Start date: 1980   Smokeless tobacco: Never  Vaping Use   Vaping Use: Never used  Substance Use Topics   Alcohol use: Yes    Comment: rare   Drug use: Never     Medication list has been reviewed and updated.  Current Meds  Medication Sig   aspirin EC 81 MG tablet Take 1 tablet by mouth daily.   Black Elderberry 50 MG/5ML SYRP Take by mouth.   cholecalciferol (VITAMIN D3) 25 MCG (1000 UNIT) tablet Take 2,000 Units by mouth daily.   clobetasol cream (TEMOVATE) 0.05 % Apply topically 2 (two) times daily. to affected area   escitalopram (LEXAPRO) 20 MG tablet Take 1 tablet (20 mg total) by mouth daily.   gabapentin (NEURONTIN) 100 MG capsule TAKE 2 CAPSULES(200 MG) BY MOUTH IN THE MORNING AND AT BEDTIME FOR TREMORS   lisinopril (ZESTRIL) 10 MG tablet Take 1 tablet (10 mg total) by mouth daily.   Melatonin 10 MG TABS Take 1 tablet by mouth at bedtime.   Multiple Vitamins-Minerals (ALIVE WOMENS ENERGY) TABS Take by mouth.   predniSONE (DELTASONE) 10 MG tablet Take 1 tablet (10 mg total) by mouth as directed for 6 days. Take 6,5,4,3,2,1 then stop   rosuvastatin (CRESTOR)  40 MG tablet Take 1 tablet (40 mg total) by mouth daily.   trimethoprim-polymyxin b (POLYTRIM) ophthalmic solution Place 1 drop into both eyes every 4 (four) hours.    PHQ 2/9 Scores 06/19/2021 04/18/2021 04/09/2021 03/11/2021  PHQ - 2 Score 0 0 0 0  PHQ- 9 Score 0 1 1 2     GAD 7 : Generalized Anxiety Score 06/19/2021 04/18/2021 04/09/2021 03/11/2021  Nervous, Anxious, on Edge 0 0 1 0  Control/stop worrying 0 0 2 0  Worry too much - different things 0 1 2 0  Trouble relaxing 0 0 2 0  Restless 0 0 0 0  Easily annoyed or irritable 0 0 0  0  Afraid - awful might happen 0 0 0 0  Total GAD 7 Score 0 1 7 0  Anxiety Difficulty Not difficult at all - - Not difficult at all    BP Readings from Last 3 Encounters:  06/19/21 132/84  05/23/21 133/60  04/18/21 128/78    Physical Exam Constitutional:      Appearance: Normal appearance.  Cardiovascular:     Rate and Rhythm: Normal rate and regular rhythm.     Pulses: Normal pulses.     Heart sounds: No murmur heard. Pulmonary:     Effort: Pulmonary effort is normal.     Breath sounds: No wheezing or rhonchi.  Musculoskeletal:     Cervical back: Normal range of motion.     Lumbar back: Bony tenderness present. Normal range of motion. Positive right straight leg raise test. Negative left straight leg raise test.     Right hip: Bony tenderness present. Normal range of motion. Decreased strength.     Left hip: Normal.  Lymphadenopathy:     Cervical: No cervical adenopathy.  Skin:    General: Skin is warm and dry.  Neurological:     General: No focal deficit present.     Mental Status: She is alert.     Sensory: Sensation is intact.     Motor: Weakness present.    Wt Readings from Last 3 Encounters:  06/19/21 137 lb 3.2 oz (62.2 kg)  05/23/21 132 lb 0.9 oz (59.9 kg)  04/18/21 132 lb (59.9 kg)    BP 132/84   Pulse 76   Ht 5\' 2"  (1.575 m)   Wt 137 lb 3.2 oz (62.2 kg)   SpO2 98%   BMI 25.09 kg/m   Assessment and Plan: 1. Right hip  pain Concern for occult fracture with recent fall Will try steroid taper since she can not take NSAIDS - DG Hip Unilat W OR W/O Pelvis 2-3 Views Right - predniSONE (DELTASONE) 10 MG tablet; Take 1 tablet (10 mg total) by mouth as directed for 6 days. Take 6,5,4,3,2,1 then stop  Dispense: 21 tablet; Refill: 0  2. Chronic midline low back pain without sciatica Right leg weakness is concerning for HNP She will continue gabapentin - DG Lumbar Spine Complete   Partially dictated using Editor, commissioning. Any errors are unintentional.  Halina Maidens, MD [facility]  06/19/2021

## 2021-07-05 ENCOUNTER — Other Ambulatory Visit: Payer: Self-pay | Admitting: Internal Medicine

## 2021-07-05 DIAGNOSIS — G250 Essential tremor: Secondary | ICD-10-CM

## 2021-07-05 NOTE — Telephone Encounter (Signed)
Requested Prescriptions  Pending Prescriptions Disp Refills  . gabapentin (NEURONTIN) 100 MG capsule [Pharmacy Med Name: GABAPENTIN 100MG  CAPSULES] 360 capsule 1    Sig: TAKE 2 CAPSULES(200 MG) BY MOUTH IN THE MORNING AND AT BEDTIME FOR TREMORS     Neurology: Anticonvulsants - gabapentin Passed - 07/05/2021 10:48 AM      Passed - Valid encounter within last 12 months    Recent Outpatient Visits          2 weeks ago Right hip pain   [facility] Glean Hess, MD   2 months ago Annual physical exam   [facility] Glean Hess, MD   2 months ago Essential hypertension   [place], Jason J, MD   3 months ago Essential hypertension   [place], Laura H, MD   8 months ago Essential hypertension   [facility] Glean Hess, MD      Future Appointments            In 3 months Army Melia Jesse Sans, MD [facility], [place]   In 9 months Army Melia, Jesse Sans, MD [facility], [facility]

## 2021-07-11 ENCOUNTER — Other Ambulatory Visit: Payer: Self-pay | Admitting: Internal Medicine

## 2021-07-11 DIAGNOSIS — E782 Mixed hyperlipidemia: Secondary | ICD-10-CM

## 2021-07-11 NOTE — Telephone Encounter (Signed)
Notes to clinic both are duplicates, pharm sent three requests for same med. Approved one rx per protocol, these are duplicates.  Requested Prescriptions  Pending Prescriptions Disp Refills   rosuvastatin (CRESTOR) 40 MG tablet [Pharmacy Med Name: ROSUVASTATIN 40MG  TABLETS] 90 tablet 1    Sig: TAKE 1 TABLET(40 MG) BY MOUTH DAILY     Cardiovascular:  Antilipid - Statins Failed - 07/11/2021  4:01 PM      Failed - Total Cholesterol in normal range and within 360 days    Cholesterol, Total  Date Value Ref Range Status  04/18/2021 219 (H) 100 - 199 mg/dL Final          Failed - LDL in normal range and within 360 days    LDL Chol Calc (NIH)  Date Value Ref Range Status  04/18/2021 142 (H) 0 - 99 mg/dL Final          Passed - HDL in normal range and within 360 days    HDL  Date Value Ref Range Status  04/18/2021 55 >39 mg/dL Final          Passed - Triglycerides in normal range and within 360 days    Triglycerides  Date Value Ref Range Status  04/18/2021 122 0 - 149 mg/dL Final          Passed - Patient is not pregnant      Passed - Valid encounter within last 12 months    Recent Outpatient Visits           3 weeks ago Right hip pain   [facility] Glean Hess, MD   2 months ago Annual physical exam   [facility] Glean Hess, MD   3 months ago Essential hypertension   [place], Jason J, MD   4 months ago Essential hypertension   [facility] Glean [facility], MD   8 months ago Essential hypertension   [place], Laura H, MD       Future Appointments             In 3 months Glean Hess, MD [facility], [place]   In 9 months Army Melia Jesse Sans, MD [facility], [facility]             rosuvastatin (CRESTOR) 40 MG tablet [Pharmacy Med Name: ROSUVASTATIN 40MG  TABLETS] 90 tablet 1    Sig: TAKE 1 TABLET(40 MG) BY MOUTH DAILY     Cardiovascular:  Antilipid -  Statins Failed - 07/11/2021  4:01 PM      Failed - Total Cholesterol in normal range and within 360 days    Cholesterol, Total  Date Value Ref Range Status  04/18/2021 219 (H) 100 - 199 mg/dL Final          Failed - LDL in normal range and within 360 days    LDL Chol Calc (NIH)  Date Value Ref Range Status  04/18/2021 142 (H) 0 - 99 mg/dL Final          Passed - HDL in normal range and within 360 days    HDL  Date Value Ref Range Status  04/18/2021 55 >39 mg/dL Final          Passed - Triglycerides in normal range and within 360 days    Triglycerides  Date Value Ref Range Status  04/18/2021 122 0 - 149 mg/dL Final  Passed - Patient is not pregnant      Passed - Valid encounter within last 12 months    Recent Outpatient Visits           3 weeks ago Right hip pain   [facility] Glean Hess, MD   2 months ago Annual physical exam   [facility] Glean Hess, MD   3 months ago Essential hypertension   [place], Jason J, MD   4 months ago Essential hypertension   [facility] Glean Hess, MD   8 months ago Essential hypertension   [facility] Glean Hess, MD       Future Appointments             In 3 months Glean Hess, MD [facility], [place]   In 9 months Glean Hess, MD [facility], [facility]            Signed Prescriptions Disp Refills   rosuvastatin (CRESTOR) 40 MG tablet 90 tablet 2    Sig: TAKE 1 TABLET(40 MG) BY MOUTH DAILY     Cardiovascular:  Antilipid - Statins Failed - 07/11/2021  4:01 PM      Failed - Total Cholesterol in normal range and within 360 days    Cholesterol, Total  Date Value Ref Range Status  04/18/2021 219 (H) 100 - 199 mg/dL Final          Failed - LDL in normal range and within 360 days    LDL Chol Calc (NIH)  Date Value Ref Range Status  04/18/2021 142 (H) 0 - 99 mg/dL Final          Passed - HDL in  normal range and within 360 days    HDL  Date Value Ref Range Status  04/18/2021 55 >39 mg/dL Final          Passed - Triglycerides in normal range and within 360 days    Triglycerides  Date Value Ref Range Status  04/18/2021 122 0 - 149 mg/dL Final          Passed - Patient is not pregnant      Passed - Valid encounter within last 12 months    Recent Outpatient Visits           3 weeks ago Right hip pain   [facility] Glean Hess, MD   2 months ago Annual physical exam   [facility] Glean Hess, MD   3 months ago Essential hypertension   [place], Jason J, MD   4 months ago Essential hypertension   [place], Laura H, MD   8 months ago Essential hypertension   [facility] Glean Hess, MD       Future Appointments             In 3 months Army Melia Jesse Sans, MD [facility], [place]   In 9 months Army Melia, Jesse Sans, MD [facility], [facility]

## 2021-07-11 NOTE — Telephone Encounter (Signed)
Requested Prescriptions  Pending Prescriptions Disp Refills  . rosuvastatin (CRESTOR) 40 MG tablet [Pharmacy Med Name: ROSUVASTATIN 40MG  TABLETS] 90 tablet 2    Sig: TAKE 1 TABLET(40 MG) BY MOUTH DAILY     Cardiovascular:  Antilipid - Statins Failed - 07/11/2021  4:01 PM      Failed - Total Cholesterol in normal range and within 360 days    Cholesterol, Total  Date Value Ref Range Status  04/18/2021 219 (H) 100 - 199 mg/dL Final         Failed - LDL in normal range and within 360 days    LDL Chol Calc (NIH)  Date Value Ref Range Status  04/18/2021 142 (H) 0 - 99 mg/dL Final         Passed - HDL in normal range and within 360 days    HDL  Date Value Ref Range Status  04/18/2021 55 >39 mg/dL Final         Passed - Triglycerides in normal range and within 360 days    Triglycerides  Date Value Ref Range Status  04/18/2021 122 0 - 149 mg/dL Final         Passed - Patient is not pregnant      Passed - Valid encounter within last 12 months    Recent Outpatient Visits          3 weeks ago Right hip pain   [facility] Glean Hess, MD   2 months ago Annual physical exam   [facility] Glean Hess, MD   3 months ago Essential hypertension   [place], Jason J, MD   4 months ago Essential hypertension   [place], Laura H, MD   8 months ago Essential hypertension   [facility] Glean Hess, MD      Future Appointments            In 3 months Army Melia Jesse Sans, MD [facility], [place]   In 9 months Army Melia Jesse Sans, MD [facility], [place]           . rosuvastatin (CRESTOR) 40 MG tablet [Pharmacy Med Name: ROSUVASTATIN 40MG  TABLETS] 90 tablet 1    Sig: TAKE 1 TABLET(40 MG) BY MOUTH DAILY     Cardiovascular:  Antilipid - Statins Failed - 07/11/2021  4:01 PM      Failed - Total Cholesterol in normal range and within 360 days    Cholesterol, Total  Date Value Ref Range  Status  04/18/2021 219 (H) 100 - 199 mg/dL Final         Failed - LDL in normal range and within 360 days    LDL Chol Calc (NIH)  Date Value Ref Range Status  04/18/2021 142 (H) 0 - 99 mg/dL Final         Passed - HDL in normal range and within 360 days    HDL  Date Value Ref Range Status  04/18/2021 55 >39 mg/dL Final         Passed - Triglycerides in normal range and within 360 days    Triglycerides  Date Value Ref Range Status  04/18/2021 122 0 - 149 mg/dL Final         Passed - Patient is not pregnant      Passed - Valid encounter within last 12 months    Recent Outpatient Visits          3  weeks ago Right hip pain   [facility] Glean Hess, MD   2 months ago Annual physical exam   [facility] Glean Hess, MD   3 months ago Essential hypertension   [place], Jason J, MD   4 months ago Essential hypertension   [facility] Glean Hess, MD   8 months ago Essential hypertension   [facility] Glean Hess, MD      Future Appointments            In 3 months Army Melia Jesse Sans, MD [facility], [place]   In 9 months Army Melia Jesse Sans, MD [place], [place]           . rosuvastatin (CRESTOR) 40 MG tablet [Pharmacy Med Name: ROSUVASTATIN 40MG  TABLETS] 90 tablet 1    Sig: TAKE 1 TABLET(40 MG) BY MOUTH DAILY     Cardiovascular:  Antilipid - Statins Failed - 07/11/2021  4:01 PM      Failed - Total Cholesterol in normal range and within 360 days    Cholesterol, Total  Date Value Ref Range Status  04/18/2021 219 (H) 100 - 199 mg/dL Final         Failed - LDL in normal range and within 360 days    LDL Chol Calc (NIH)  Date Value Ref Range Status  04/18/2021 142 (H) 0 - 99 mg/dL Final         Passed - HDL in normal range and within 360 days    HDL  Date Value Ref Range Status  04/18/2021 55 >39 mg/dL Final         Passed - Triglycerides in normal range and within  360 days    Triglycerides  Date Value Ref Range Status  04/18/2021 122 0 - 149 mg/dL Final         Passed - Patient is not pregnant      Passed - Valid encounter within last 12 months    Recent Outpatient Visits          3 weeks ago Right hip pain   [facility] Glean Hess, MD   2 months ago Annual physical exam   [facility] Glean Hess, MD   3 months ago Essential hypertension   [place], Jason J, MD   4 months ago Essential hypertension   [place], Laura H, MD   8 months ago Essential hypertension   [facility] Glean Hess, MD      Future Appointments            In 3 months Army Melia Jesse Sans, MD [facility], [place]   In 9 months Army Melia Jesse Sans, MD [facility], [facility]

## 2021-07-12 NOTE — Telephone Encounter (Signed)
This is a duplicate request. Already refilled. Denied for that reason.

## 2021-08-09 ENCOUNTER — Other Ambulatory Visit: Payer: Self-pay | Admitting: Internal Medicine

## 2021-08-09 DIAGNOSIS — N904 Leukoplakia of vulva: Secondary | ICD-10-CM

## 2021-08-09 NOTE — Telephone Encounter (Signed)
Requested Prescriptions  Pending Prescriptions Disp Refills  . clobetasol cream (TEMOVATE) 0.05 % [Pharmacy Med Name: CLOBETASOL PROP 0.05% CREAM 60GM] 60 g 1    Sig: APPLY TOPICALLY TO THE AFFECTED AREA TWICE DAILY     Dermatology:  Corticosteroids Passed - 08/09/2021 12:21 PM      Passed - Valid encounter within last 12 months    Recent Outpatient Visits          1 month ago Right hip pain   [facility] Glean Hess, MD   3 months ago Annual physical exam   [facility] Glean Hess, MD   4 months ago Essential hypertension   [place], Jason J, MD   5 months ago Essential hypertension   [facility] Glean Hess, MD   9 months ago Essential hypertension   [facility] Glean Hess, MD      Future Appointments            In 2 months Army Melia Jesse Sans, MD [facility], [place]   In 8 months Army Melia, Jesse Sans, MD [facility], [facility]

## 2021-08-21 ENCOUNTER — Ambulatory Visit (INDEPENDENT_AMBULATORY_CARE_PROVIDER_SITE_OTHER): Payer: Medicare HMO

## 2021-08-21 DIAGNOSIS — Z0000 Encounter for general adult medical examination without abnormal findings: Secondary | ICD-10-CM | POA: Diagnosis not present

## 2021-08-21 DIAGNOSIS — Z1211 Encounter for screening for malignant neoplasm of colon: Secondary | ICD-10-CM | POA: Diagnosis not present

## 2021-08-21 DIAGNOSIS — Z122 Encounter for screening for malignant neoplasm of respiratory organs: Secondary | ICD-10-CM | POA: Diagnosis not present

## 2021-08-21 NOTE — Progress Notes (Signed)
Subjective:   Tiffany Bonilla is a 65 y.o. female who presents for Medicare Annual (Subsequent) preventive examination.  Virtual Visit via Telephone Note  I connected with  [name] on 08/21/21 at  2:00 PM EST by telephone and verified that I am speaking with the correct person using two identifiers.  Location: Patient: home Provider: [facility] Persons participating in the virtual visit: [place]   I discussed the limitations, risks, security and privacy concerns of performing an evaluation and management service by telephone and the availability of in person appointments. The patient expressed understanding and agreed to proceed.  Interactive audio and video telecommunications were attempted between this nurse and patient, however failed, due to patient having technical difficulties OR patient did not have access to video capability.  We continued and completed visit with audio only.  Some vital signs may be absent or patient reported.   Clemetine Marker, LPN   Review of Systems     Cardiac Risk Factors include: advanced age (>60men, >32 women);dyslipidemia;hypertension;smoking/ tobacco exposure     Objective:    There were no vitals filed for this visit. There is no height or weight on file to calculate BMI.  Advanced Directives 08/21/2021 05/23/2021 02/20/2021 02/06/2021 08/20/2020  Does Patient Have a Medical Advance Directive? Yes No No No No  Type of Paramedic of [place];Living will - - - -  Copy of [place] in Chart? No - copy requested - - - -  Would patient like information on creating a medical advance directive? - - No - Patient declined No - Patient declined Yes (MAU/Ambulatory/Procedural Areas - Information given)    Current Medications (verified) Outpatient Encounter Medications as of 08/21/2021  Medication Sig   aspirin EC 81 MG tablet Take 1 tablet by mouth daily.   Black Elderberry 50  MG/5ML SYRP Take by mouth.   cholecalciferol (VITAMIN D3) 25 MCG (1000 UNIT) tablet Take 2,000 Units by mouth daily.   clobetasol cream (TEMOVATE) 0.05 % APPLY TOPICALLY TO THE AFFECTED AREA TWICE DAILY   escitalopram (LEXAPRO) 20 MG tablet Take 1 tablet (20 mg total) by mouth daily.   gabapentin (NEURONTIN) 100 MG capsule TAKE 2 CAPSULES(200 MG) BY MOUTH IN THE MORNING AND AT BEDTIME FOR TREMORS   lisinopril (ZESTRIL) 10 MG tablet Take 1 tablet (10 mg total) by mouth daily.   Melatonin 10 MG TABS Take 1 tablet by mouth at bedtime.   Multiple Vitamins-Minerals (ALIVE WOMENS ENERGY) TABS Take by mouth.   rosuvastatin (CRESTOR) 40 MG tablet TAKE 1 TABLET(40 MG) BY MOUTH DAILY   [DISCONTINUED] trimethoprim-polymyxin b (POLYTRIM) ophthalmic solution Place 1 drop into both eyes every 4 (four) hours.   No facility-administered encounter medications on file as of 08/21/2021.    Allergies (verified) Morphine, Scopolamine, Codeine, and Sulfa antibiotics   History: Past Medical History:  Diagnosis Date   Allergy    Asthma    Hyperlipidemia    Hypertension    Pre-diabetes 11/24/2018   Spina bifida occulta    Stroke [facility])    SVT (supraventricular tachycardia) ([place]) 11/24/2018   S/p ablation at [facility]   Wears dentures    full upper   Past Surgical History:  Procedure Laterality Date   [place]  2006   for SVT   CATARACT EXTRACTION W/PHACO Right 02/06/2021   Procedure: CATARACT EXTRACTION PHACO AND INTRAOCULAR LENS PLACEMENT ([place]) RIGHT;  Surgeon: Leandrew Koyanagi, MD;  Location: [place];  Service: Ophthalmology;  Laterality: Right;  2.85 0:32.6 8.7%   CATARACT EXTRACTION W/PHACO Left 02/20/2021   Procedure: CATARACT EXTRACTION PHACO AND INTRAOCULAR LENS PLACEMENT ([place]) LEFT;  Surgeon: Leandrew Koyanagi, MD;  Location: [place];  Service: Ophthalmology;  Laterality: Left;  3.12 0:39.5   COLONOSCOPY  2006   CORONARY ANGIOPLASTY  WITH STENT PLACEMENT  2001   to Circumfx   ESOPHAGOGASTRODUODENOSCOPY  2017   LAPAROSCOPIC CHOLECYSTECTOMY  04/25/2019   @ [facility]   LAPAROSCOPIC GASTRIC SLEEVE RESECTION  11/07/2015   TOTAL ABDOMINAL HYSTERECTOMY  1992   CIS cervix   Family History  Problem Relation Age of Onset   Aneurysm Mother    Breast cancer Maternal Aunt    Social History   Socioeconomic History   Marital status: Widowed    Spouse name: Not on file   Number of children: 5   Years of education: Not on file   Highest education level: Not on file  Occupational History   Not on file  Tobacco Use   Smoking status: Every Day    Packs/day: 0.50    Years: 40.00    Pack years: 20.00    Types: Cigarettes    Start date: 57   Smokeless tobacco: Never  Vaping Use   Vaping Use: Never used  Substance and Sexual Activity   Alcohol use: Yes    Comment: rare   Drug use: Never   Sexual activity: Not Currently  Other Topics Concern   Not on file  Social History Narrative   Pt lives with 2 of her daughters and 2 grandchildren   Social Determinants of Health   Financial Resource Strain: Low Risk    Difficulty of Paying Living Expenses: Not hard at all  Food Insecurity: No Food Insecurity   Worried About Charity fundraiser in the Last Year: Never true   Arboriculturist in the Last Year: Never true  Transportation Needs: No Transportation Needs   Lack of Transportation (Medical): No   Lack of Transportation (Non-Medical): No  Physical Activity: Inactive   Days of Exercise per Week: 0 days   Minutes of Exercise per Session: 0 min  Stress: No Stress Concern Present   Feeling of Stress : Not at all  Social Connections: Socially Isolated   Frequency of Communication with Friends and Family: More than three times a week   Frequency of Social Gatherings with Friends and Family: More than three times a week   Attends Religious Services: Never   Marine scientist or Organizations: No   Attends Theatre manager Meetings: Never   Marital Status: Widowed    Tobacco Counseling Ready to quit: Not Answered Counseling given: Not Answered   Clinical Intake:  Pre-visit preparation completed: Yes  Pain : No/denies pain     Nutritional Risks: None Diabetes: No  How often do you need to have someone help you when you read instructions, pamphlets, or other written materials from your doctor or pharmacy?: 1 - Never    Interpreter Needed?: No  Information entered by :: Clemetine Marker LPN   Activities of Daily Living In your present state of health, do you have any difficulty performing the following activities: 08/21/2021 04/18/2021  Hearing? N N  Vision? N N  Difficulty concentrating or making decisions? N N  Walking or climbing stairs? N N  Dressing or bathing? N N  Doing errands, shopping? N N  Preparing Food and eating ? N -  Using  the Toilet? N -  In the past six months, have you accidently leaked urine? N -  Do you have problems with loss of bowel control? N -  Managing your Medications? N -  Managing your Finances? N -  Housekeeping or managing your Housekeeping? N -  Some recent data might be hidden    Patient Care Team: Glean Hess, MD as PCP - General (Internal Medicine)  Indicate any recent Medical Services you may have received from other than [facility] providers in the past year (date may be approximate).     Assessment:   This is a routine wellness examination for [place].  Hearing/Vision screen Hearing Screening - Comments:: Pt denies hearing difficulty Vision Screening - Comments:: Annual vision screenings done by [facility]  Dietary issues and exercise activities discussed: Current Exercise Habits: The patient does not participate in regular exercise at present, Exercise limited by: None identified   Goals Addressed             This Visit's Progress    Quit Smoking       If you wish to quit smoking, help is available. For free  tobacco cessation program offerings call the [facility] at 256-877-2687 or Live Well Line at 408 759 9798. You may also visit [email or web address] or email [email or web address] for more information on other programs.         Depression Screen PHQ 2/9 Scores 08/21/2021 06/19/2021 04/18/2021 04/09/2021 03/11/2021 10/29/2020 08/20/2020  PHQ - 2 Score 0 0 0 0 0 0 0  PHQ- 9 Score - 0 1 1 2  0 -    Fall Risk Fall Risk  08/21/2021 06/19/2021 04/18/2021 04/09/2021 03/11/2021  Falls in the past year? 1 1 1 1 1   Number falls in past yr: 1 1 0 0 0  Injury with Fall? 1 1 1 1 1   Risk for fall due to : History of fall(s) History of fall(s);Impaired balance/gait History of fall(s) No Fall Risks -  Follow up Falls prevention discussed Falls evaluation completed Falls evaluation completed Falls evaluation completed Falls evaluation completed    [place]:  Any stairs in or around the home? Yes  If so, are there any without handrails? No  Home free of loose throw rugs in walkways, pet beds, electrical cords, etc? Yes  Adequate lighting in your home to reduce risk of falls? Yes   ASSISTIVE DEVICES UTILIZED TO PREVENT FALLS:  Life alert? No  Use of a cane, walker or w/c? No  Grab bars in the bathroom? No  Shower chair or bench in shower? Yes  Elevated toilet seat or a handicapped toilet? No   TIMED UP AND GO:  Was the test performed? No . Telephonic visit  Cognitive Function: Normal cognitive status assessed by direct observation by this Nurse Health Advisor. No abnormalities found.          Immunizations Immunization History  Administered Date(s) Administered   Fluad Quad(high Dose 65+) 10/29/2020   Influenza,inj,Quad PF,6+ Mos 07/13/2019   Influenza-Unspecified 06/28/2013, 06/18/2015   Moderna Sars-Covid-2 Vaccination 09/14/2019, 10/24/2019, 10/02/2020   Pfizer Covid-19 Vaccine Bivalent Booster 51yrs & up 05/28/2021   Pneumococcal  Conjugate-13 10/29/2020   Pneumococcal Polysaccharide-23 06/29/2015, 11/25/2018   Pneumococcal-Unspecified 01/13/2011   Tdap 10/31/2010   Zoster Recombinat (Shingrix) 04/18/2021    TDAP status: Due, Education has been provided regarding the importance of this vaccine. Advised may receive this vaccine at local pharmacy or Health Dept. Aware  to provide a copy of the vaccination record if obtained from local pharmacy or Health Dept. Verbalized acceptance and understanding.  Flu Vaccine status: Due, Education has been provided regarding the importance of this vaccine. Advised may receive this vaccine at local pharmacy or Health Dept. Aware to provide a copy of the vaccination record if obtained from local pharmacy or Health Dept. Verbalized acceptance and understanding.  Pneumococcal vaccine status: Up to date  Covid-19 vaccine status: Completed vaccines  Qualifies for Shingles Vaccine? Yes   Zostavax completed No   Shingrix Completed?: Yes - due for second dose  Screening Tests Health Maintenance  Topic Date Due   INFLUENZA VACCINE  04/01/2021   Zoster Vaccines- Shingrix (2 of 2) 06/13/2021   COLONOSCOPY (Pts 45-8yrs Insurance coverage will need to be confirmed)  06/19/2022 (Originally 07/18/1999)   TETANUS/TDAP  06/19/2022 (Originally 10/30/2020)   MAMMOGRAM  05/02/2023   Pneumonia Vaccine 64+ Years old (3 - PPSV23 if available, else PCV20) 11/25/2023   DEXA SCAN  Completed   COVID-19 Vaccine  Completed   Hepatitis C Screening  Completed   HPV VACCINES  Aged Out    Health Maintenance  Health Maintenance Due  Topic Date Due   INFLUENZA VACCINE  04/01/2021   Zoster Vaccines- Shingrix (2 of 2) 06/13/2021    Colorectal cancer screening: Referral sent to [facility] today for screening colonoscopy. Pt aware they will contact her for appt.   Mammogram status: Completed 05/01/21. Repeat every year  Bone Density status: Completed 05/01/21. Results reflect: Bone density  results: OSTEOPENIA. Repeat every 2 years.  Lung Cancer Screening: (Low Dose CT Chest recommended if Age 74-80 years, 30 pack-year currently smoking OR have quit w/in 15years.) does qualify.   Lung Cancer Screening Referral: A referral has been sent to [facility] Cancer Screening regarding the possible need for this exam. The patient's chart will be reviewed to determine if they qualify and the patient will be contacted to facilitate the scheduling of the Low Dose Chest CT for lung cancer screening.    Additional Screening:  Hepatitis C Screening: does qualify; Completed 10/25/15  Vision Screening: Recommended annual ophthalmology exams for early detection of glaucoma and other disorders of the eye. Is the patient up to date with their annual eye exam?  Yes  Who is the provider or what is the name of the office in which the patient attends annual eye exams? [facility].   Dental Screening: Recommended annual dental exams for proper oral hygiene  Community Resource Referral / Chronic Care Management: CRR required this visit?  No   CCM required this visit?  No      Plan:     I have personally reviewed and noted the following in the patients chart:   Medical and social history Use of alcohol, tobacco or illicit drugs  Current medications and supplements including opioid prescriptions.  Functional ability and status Nutritional status Physical activity Advanced directives List of other physicians Hospitalizations, surgeries, and ER visits in previous 12 months Vitals Screenings to include cognitive, depression, and falls Referrals and appointments  In addition, I have reviewed and discussed with patient certain preventive protocols, quality metrics, and best practice recommendations. A written personalized care plan for preventive services as well as general preventive health recommendations were provided to patient.     Clemetine Marker, LPN   [id number]    Nurse Notes: none

## 2021-08-21 NOTE — Patient Instructions (Signed)
Tiffany Bonilla , Thank you for taking time to come for your Medicare Wellness Visit. I appreciate your ongoing commitment to your health goals. Please review the following plan we discussed and let me know if I can assist you in the future.   Screening recommendations/referrals: Colonoscopy: Referral sent to [facility] today for screening colonoscopy. They will contact you for an appointment.  Mammogram: done 05/01/21 Bone Density: done 05/01/21 Recommended yearly ophthalmology/optometry visit for glaucoma screening and checkup Recommended yearly dental visit for hygiene and checkup  Vaccinations: Influenza vaccine: due Pneumococcal vaccine: done 10/29/20 Tdap vaccine: due Shingles vaccine: done 04/18/21; due for second dose   Covid-19:done 09/14/19, 10/24/19, 10/02/20 & 05/28/21  Advanced directives: Please bring a copy of your health care power of attorney and living will to the office at your convenience.   Conditions/risks identified: If you wish to quit smoking, help is available. For free tobacco cessation program offerings call the [facility] at (910)271-2469 or Live Well Line at (952)401-8235. You may also visit [email or web address] or email [email or web address] for more information on other programs.   Next appointment: Follow up in one year for your annual wellness visit    Preventive Care 65 Years and Older, Female Preventive care refers to lifestyle choices and visits with your health care provider that can promote health and wellness. What does preventive care include? A yearly physical exam. This is also called an annual well check. Dental exams once or twice a year. Routine eye exams. Ask your health care provider how often you should have your eyes checked. Personal lifestyle choices, including: Daily care of your teeth and gums. Regular physical activity. Eating a healthy diet. Avoiding tobacco and drug use. Limiting alcohol  use. Practicing safe sex. Taking low-dose aspirin every day. Taking vitamin and mineral supplements as recommended by your health care provider. What happens during an annual well check? The services and screenings done by your health care provider during your annual well check will depend on your age, overall health, lifestyle risk factors, and family history of disease. Counseling  Your health care provider may ask you questions about your: Alcohol use. Tobacco use. Drug use. Emotional well-being. Home and relationship well-being. Sexual activity. Eating habits. History of falls. Memory and ability to understand (cognition). Work and work Statistician. Reproductive health. Screening  You may have the following tests or measurements: Height, weight, and BMI. Blood pressure. Lipid and cholesterol levels. These may be checked every 5 years, or more frequently if you are over 36 years old. Skin check. Lung cancer screening. You may have this screening every year starting at age 81 if you have a 30-pack-year history of smoking and currently smoke or have quit within the past 15 years. Fecal occult blood test (FOBT) of the stool. You may have this test every year starting at age 44. Flexible sigmoidoscopy or colonoscopy. You may have a sigmoidoscopy every 5 years or a colonoscopy every 10 years starting at age 37. Hepatitis C blood test. Hepatitis B blood test. Sexually transmitted disease (STD) testing. Diabetes screening. This is done by checking your blood sugar (glucose) after you have not eaten for a while (fasting). You may have this done every 1-3 years. Bone density scan. This is done to screen for osteoporosis. You may have this done starting at age 50. Mammogram. This may be done every 1-2 years. Talk to your health care provider about how often you should have regular mammograms. Talk with your health care provider  about your test results, treatment options, and if necessary,  the need for more tests. Vaccines  Your health care provider may recommend certain vaccines, such as: Influenza vaccine. This is recommended every year. Tetanus, diphtheria, and acellular pertussis (Tdap, Td) vaccine. You may need a Td booster every 10 years. Zoster vaccine. You may need this after age 67. Pneumococcal 13-valent conjugate (PCV13) vaccine. One dose is recommended after age 45. Pneumococcal polysaccharide (PPSV23) vaccine. One dose is recommended after age 86. Talk to your health care provider about which screenings and vaccines you need and how often you need them. This information is not intended to replace advice given to you by your health care provider. Make sure you discuss any questions you have with your health care provider. Document Released: 09/14/2015 Document Revised: 05/07/2016 Document Reviewed: 06/19/2015 Elsevier Interactive Patient Education  2017 [place] Prevention in the Home Falls can cause injuries. They can happen to people of all ages. There are many things you can do to make your home safe and to help prevent falls. What can I do on the outside of my home? Regularly fix the edges of walkways and driveways and fix any cracks. Remove anything that might make you trip as you walk through a door, such as a raised step or threshold. Trim any bushes or trees on the path to your home. Use bright outdoor lighting. Clear any walking paths of anything that might make someone trip, such as rocks or tools. Regularly check to see if handrails are loose or broken. Make sure that both sides of any steps have handrails. Any raised decks and porches should have guardrails on the edges. Have any leaves, snow, or ice cleared regularly. Use sand or salt on walking paths during winter. Clean up any spills in your garage right away. This includes oil or grease spills. What can I do in the bathroom? Use night lights. Install grab bars by the toilet and in the  tub and shower. Do not use towel bars as grab bars. Use non-skid mats or decals in the tub or shower. If you need to sit down in the shower, use a plastic, non-slip stool. Keep the floor dry. Clean up any water that spills on the floor as soon as it happens. Remove soap buildup in the tub or shower regularly. Attach bath mats securely with double-sided non-slip rug tape. Do not have throw rugs and other things on the floor that can make you trip. What can I do in the bedroom? Use night lights. Make sure that you have a light by your bed that is easy to reach. Do not use any sheets or blankets that are too big for your bed. They should not hang down onto the floor. Have a firm chair that has side arms. You can use this for support while you get dressed. Do not have throw rugs and other things on the floor that can make you trip. What can I do in the kitchen? Clean up any spills right away. Avoid walking on wet floors. Keep items that you use a lot in easy-to-reach places. If you need to reach something above you, use a strong step stool that has a grab bar. Keep electrical cords out of the way. Do not use floor polish or wax that makes floors slippery. If you must use wax, use non-skid floor wax. Do not have throw rugs and other things on the floor that can make you trip. What can I do  with my stairs? Do not leave any items on the stairs. Make sure that there are handrails on both sides of the stairs and use them. Fix handrails that are broken or loose. Make sure that handrails are as long as the stairways. Check any carpeting to make sure that it is firmly attached to the stairs. Fix any carpet that is loose or worn. Avoid having throw rugs at the top or bottom of the stairs. If you do have throw rugs, attach them to the floor with carpet tape. Make sure that you have a light switch at the top of the stairs and the bottom of the stairs. If you do not have them, ask someone to add them for  you. What else can I do to help prevent falls? Wear shoes that: Do not have high heels. Have rubber bottoms. Are comfortable and fit you well. Are closed at the toe. Do not wear sandals. If you use a stepladder: Make sure that it is fully opened. Do not climb a closed stepladder. Make sure that both sides of the stepladder are locked into place. Ask someone to hold it for you, if possible. Clearly mark and make sure that you can see: Any grab bars or handrails. First and last steps. Where the edge of each step is. Use tools that help you move around (mobility aids) if they are needed. These include: Canes. Walkers. Scooters. Crutches. Turn on the lights when you go into a dark area. Replace any light bulbs as soon as they burn out. Set up your furniture so you have a clear path. Avoid moving your furniture around. If any of your floors are uneven, fix them. If there are any pets around you, be aware of where they are. Review your medicines with your doctor. Some medicines can make you feel dizzy. This can increase your chance of falling. Ask your doctor what other things that you can do to help prevent falls. This information is not intended to replace advice given to you by your health care provider. Make sure you discuss any questions you have with your health care provider. Document Released: 06/14/2009 Document Revised: 01/24/2016 Document Reviewed: 09/22/2014 Elsevier Interactive Patient Education  2017 [employer].

## 2021-08-28 ENCOUNTER — Telehealth: Payer: Self-pay

## 2021-08-28 NOTE — Telephone Encounter (Signed)
CALLED PATIENT NO ANSWER LEFT VOICEMAIL FOR A CALL BACK ? ?

## 2021-09-04 ENCOUNTER — Telehealth: Payer: Self-pay

## 2021-09-04 NOTE — Telephone Encounter (Signed)
CALLED PATIENT NO ANSWER LEFT VOICEMAIL FOR A CALL BACK ? ?

## 2021-09-04 NOTE — Telephone Encounter (Signed)
CALLED PATIENT NO ANSWER LEFT VOICEMAIL FOR A CALL BACK °Letter sent °

## 2021-09-05 ENCOUNTER — Other Ambulatory Visit: Payer: Self-pay | Admitting: Internal Medicine

## 2021-09-05 DIAGNOSIS — I10 Essential (primary) hypertension: Secondary | ICD-10-CM

## 2021-09-05 NOTE — Telephone Encounter (Signed)
Requested Prescriptions  Pending Prescriptions Disp Refills   lisinopril (ZESTRIL) 20 MG tablet [Pharmacy Med Name: LISINOPRIL 20MG  TABLETS] 90 tablet 1    Sig: TAKE 1 TABLET(20 MG) BY MOUTH DAILY     Cardiovascular:  ACE Inhibitors Failed - 09/05/2021 12:14 PM      Failed - Cr in normal range and within 180 days    Creatinine, Ser  Date Value Ref Range Status  04/18/2021 1.44 (H) 0.57 - 1.00 mg/dL Final         Passed - K in normal range and within 180 days    Potassium  Date Value Ref Range Status  04/18/2021 3.8 3.5 - 5.2 mmol/L Final         Passed - Patient is not pregnant      Passed - Last BP in normal range    BP Readings from Last 1 Encounters:  06/19/21 132/84         Passed - Valid encounter within last 6 months    Recent Outpatient Visits          2 months ago Right hip pain   [facility] Glean Hess, MD   4 months ago Annual physical exam   [facility] Glean Hess, MD   4 months ago Essential hypertension   [place], Jason J, MD   5 months ago Essential hypertension   [place], Laura H, MD   10 months ago Essential hypertension   [facility] Glean Hess, MD      Future Appointments            In 1 month Army Melia Jesse Sans, MD [facility], [place]   In 7 months Army Melia, Jesse Sans, MD [facility], [facility]

## 2021-09-06 ENCOUNTER — Other Ambulatory Visit: Payer: Self-pay

## 2021-09-06 DIAGNOSIS — Z1211 Encounter for screening for malignant neoplasm of colon: Secondary | ICD-10-CM

## 2021-09-06 MED ORDER — PEG 3350-KCL-NA BICARB-NACL 420 G PO SOLR: 4000.0000 mL | Freq: Once | ORAL | 0 refills | Status: AC

## 2021-09-06 NOTE — Progress Notes (Signed)
Gastroenterology Pre-Procedure Review  Request Date: 10/25/2021 Requesting Physician: Dr. Allen Norris  PATIENT REVIEW QUESTIONS: The patient responded to the following health history questions as indicated:    1. Are you having any GI issues? no 2. Do you have a personal history of Polyps? no 3. Do you have a family history of Colon Cancer or Polyps? no 4. Diabetes Mellitus? no 5. Joint replacements in the past 12 months?no 6. Major health problems in the past 3 months?no Did have gastric sleave 7 years ago 7. Any artificial heart valves, MVP, or defibrillator?no has stint    MEDICATIONS & ALLERGIES:    Patient reports the following regarding taking any anticoagulation/antiplatelet therapy:   Plavix, Coumadin, Eliquis, Xarelto, Lovenox, Pradaxa, Brilinta, or Effient? no Aspirin? no  Patient confirms/reports the following medications:  Current Outpatient Medications  Medication Sig Dispense Refill   aspirin EC 81 MG tablet Take 1 tablet by mouth daily.     Black Elderberry 50 MG/5ML SYRP Take by mouth.     cholecalciferol (VITAMIN D3) 25 MCG (1000 UNIT) tablet Take 2,000 Units by mouth daily.     clobetasol cream (TEMOVATE) 0.05 % APPLY TOPICALLY TO THE AFFECTED AREA TWICE DAILY 60 g 1   escitalopram (LEXAPRO) 20 MG tablet Take 1 tablet (20 mg total) by mouth daily. 90 tablet 1   gabapentin (NEURONTIN) 100 MG capsule TAKE 2 CAPSULES(200 MG) BY MOUTH IN THE MORNING AND AT BEDTIME FOR TREMORS 360 capsule 1   lisinopril (ZESTRIL) 10 MG tablet Take 1 tablet (10 mg total) by mouth daily. 90 tablet 1   Melatonin 10 MG TABS Take 1 tablet by mouth at bedtime.     Multiple Vitamins-Minerals (ALIVE WOMENS ENERGY) TABS Take by mouth.     rosuvastatin (CRESTOR) 40 MG tablet TAKE 1 TABLET(40 MG) BY MOUTH DAILY 90 tablet 2   No current facility-administered medications for this visit.    Patient confirms/reports the following allergies:  Allergies  Allergen Reactions   Morphine Hives and Shortness  Of Breath    Stopped breathing    Scopolamine Other (See Comments)    Unconscious    Codeine Nausea Only and Other (See Comments)    NAUSEA/VOMITING    Sulfa Antibiotics Hives and Nausea Only    Other Reaction: Not Assessed     No orders of the defined types were placed in this encounter.   AUTHORIZATION INFORMATION Primary Insurance: 1D#: Group #:  Secondary Insurance: 1D#: Group #:  SCHEDULE INFORMATION: Date:10/25/2021  Time: Location: [facility]

## 2021-10-17 ENCOUNTER — Encounter: Payer: Self-pay | Admitting: Gastroenterology

## 2021-10-17 ENCOUNTER — Telehealth: Payer: Self-pay | Admitting: *Deleted

## 2021-10-17 NOTE — Telephone Encounter (Signed)
[place] back to schedule yearly L[facility] CT.

## 2021-10-21 ENCOUNTER — Ambulatory Visit: Payer: Medicare HMO | Admitting: Internal Medicine

## 2021-10-25 ENCOUNTER — Encounter: Payer: Self-pay | Admitting: Gastroenterology

## 2021-10-25 ENCOUNTER — Other Ambulatory Visit: Payer: Self-pay

## 2021-10-25 ENCOUNTER — Ambulatory Visit: Payer: Medicare HMO | Admitting: Anesthesiology

## 2021-10-25 ENCOUNTER — Ambulatory Visit
Admission: RE | Admit: 2021-10-25 | Discharge: 2021-10-25 | Disposition: A | Discharge status: Home / Self Care | Payer: Medicare HMO | Attending: Gastroenterology | Admitting: Gastroenterology

## 2021-10-25 ENCOUNTER — Encounter
Admission: RE | Disposition: A | Discharge status: Home / Self Care | Payer: Self-pay | Source: Home / Self Care | Attending: Gastroenterology

## 2021-10-25 DIAGNOSIS — Z1211 Encounter for screening for malignant neoplasm of colon: Secondary | ICD-10-CM

## 2021-10-25 DIAGNOSIS — K635 Polyp of colon: Secondary | ICD-10-CM | POA: Diagnosis not present

## 2021-10-25 DIAGNOSIS — D123 Benign neoplasm of transverse colon: Secondary | ICD-10-CM | POA: Insufficient documentation

## 2021-10-25 DIAGNOSIS — D120 Benign neoplasm of cecum: Secondary | ICD-10-CM | POA: Diagnosis not present

## 2021-10-25 DIAGNOSIS — F419 Anxiety disorder, unspecified: Secondary | ICD-10-CM | POA: Insufficient documentation

## 2021-10-25 DIAGNOSIS — D124 Benign neoplasm of descending colon: Secondary | ICD-10-CM | POA: Diagnosis not present

## 2021-10-25 DIAGNOSIS — I10 Essential (primary) hypertension: Secondary | ICD-10-CM | POA: Insufficient documentation

## 2021-10-25 DIAGNOSIS — I2510 Atherosclerotic heart disease of native coronary artery without angina pectoris: Secondary | ICD-10-CM | POA: Diagnosis not present

## 2021-10-25 DIAGNOSIS — F17210 Nicotine dependence, cigarettes, uncomplicated: Secondary | ICD-10-CM | POA: Diagnosis not present

## 2021-10-25 DIAGNOSIS — K648 Other hemorrhoids: Secondary | ICD-10-CM | POA: Diagnosis not present

## 2021-10-25 DIAGNOSIS — D126 Benign neoplasm of colon, unspecified: Secondary | ICD-10-CM | POA: Diagnosis not present

## 2021-10-25 HISTORY — PX: POLYPECTOMY: SHX5525

## 2021-10-25 HISTORY — PX: COLONOSCOPY WITH PROPOFOL: SHX5780

## 2021-10-25 SURGERY — COLONOSCOPY WITH PROPOFOL
Anesthesia: General | Site: Rectum

## 2021-10-25 MED ORDER — LIDOCAINE HCL (CARDIAC) PF 100 MG/5ML IV SOSY
PREFILLED_SYRINGE | INTRAVENOUS | Status: DC | PRN
  Administered 2021-10-25: 40 mg via INTRAVENOUS

## 2021-10-25 MED ORDER — PROPOFOL 10 MG/ML IV BOLUS
INTRAVENOUS | Status: DC | PRN
Start: 2021-10-25 — End: 2021-10-25
  Administered 2021-10-25: 20 mg via INTRAVENOUS
  Administered 2021-10-25 (×2): 50 mg via INTRAVENOUS
  Administered 2021-10-25: 40 mg via INTRAVENOUS

## 2021-10-25 MED ORDER — SODIUM CHLORIDE 0.9 % IV SOLN: INTRAVENOUS | Status: DC

## 2021-10-25 MED ORDER — LACTATED RINGERS IV SOLN: INTRAVENOUS | Status: DC

## 2021-10-25 MED ORDER — STERILE WATER FOR IRRIGATION IR SOLN
Status: DC | PRN
  Administered 2021-10-25: 1

## 2021-10-25 SURGICAL SUPPLY — 8 items
GOWN CVR UNV OPN BCK APRN NK (MISCELLANEOUS) ×2 IMPLANT
GOWN ISOL THUMB LOOP REG UNIV (MISCELLANEOUS) ×4
KIT PRC NS LF DISP ENDO (KITS) ×1 IMPLANT
KIT PROCEDURE OLYMPUS (KITS) ×2
MANIFOLD NEPTUNE II (INSTRUMENTS) ×2 IMPLANT
SNARE COLD EXACTO (MISCELLANEOUS) ×1 IMPLANT
TRAP ETRAP POLY (MISCELLANEOUS) ×1 IMPLANT
WATER STERILE IRR 250ML POUR (IV SOLUTION) ×2 IMPLANT

## 2021-10-25 NOTE — Anesthesia Preprocedure Evaluation (Signed)
Anesthesia Evaluation  Patient identified by MRN, date of birth, ID band Patient awake    Reviewed: Allergy & Precautions, H&P , NPO status , Patient's Chart, lab work & pertinent test results  Airway Mallampati: II  TM Distance: >3 FB Neck ROM: full    Dental no notable dental hx.    Pulmonary asthma , Current Smoker and Patient abstained from smoking.,    Pulmonary exam normal        Cardiovascular hypertension, + CAD  + dysrhythmias Supra Ventricular Tachycardia  Rhythm:regular Rate:Normal     Neuro/Psych Anxiety CVA, No Residual Symptoms    GI/Hepatic negative GI ROS, (+) C  Endo/Other  negative endocrine ROS  Renal/GU   negative genitourinary   Musculoskeletal   Abdominal   Peds  Hematology negative hematology ROS (+)   Anesthesia Other Findings   Reproductive/Obstetrics                             Anesthesia Physical Anesthesia Plan  ASA: 3  Anesthesia Plan: General   Post-op Pain Management:    Induction:   PONV Risk Score and Plan: 2 and TIVA, Propofol infusion and Treatment may vary due to age or medical condition  Airway Management Planned:   Additional Equipment:   Intra-op Plan:   Post-operative Plan:   Informed Consent: I have reviewed the patients History and Physical, chart, labs and discussed the procedure including the risks, benefits and alternatives for the proposed anesthesia with the patient or authorized representative who has indicated his/her understanding and acceptance.       Plan Discussed with:   Anesthesia Plan Comments:         Anesthesia Quick Evaluation

## 2021-10-25 NOTE — Anesthesia Postprocedure Evaluation (Signed)
Anesthesia Post Note  Patient: Tiffany Bonilla  Procedure(s) Performed: COLONOSCOPY WITH BIOPSY (Rectum) POLYPECTOMY (Rectum)     Patient location during evaluation: PACU Anesthesia Type: General Level of consciousness: awake and alert Pain management: pain level controlled Vital Signs Assessment: post-procedure vital signs reviewed and stable Respiratory status: spontaneous breathing Cardiovascular status: stable Anesthetic complications: no   No notable events documented.  Gillian Scarce

## 2021-10-25 NOTE — Transfer of Care (Signed)
Immediate Anesthesia Transfer of Care Note  Patient: Tiffany Bonilla  Procedure(s) Performed: COLONOSCOPY WITH BIOPSY (Rectum) POLYPECTOMY (Rectum)  Patient Location: PACU  Anesthesia Type: General  Level of Consciousness: awake, alert  and patient cooperative  Airway and Oxygen Therapy: Patient Spontanous Breathing and Patient connected to supplemental oxygen  Post-op Assessment: Post-op Vital signs reviewed, Patient's Cardiovascular Status Stable, Respiratory Function Stable, Patent Airway and No signs of Nausea or vomiting  Post-op Vital Signs: Reviewed and stable  Complications: No notable events documented.

## 2021-10-25 NOTE — H&P (Signed)
Tiffany Lame, MD [facility] [address], [place], [name] [address] Phone: [phone] Fax : [phone]  Primary Care Physician:  Glean Hess, MD Primary Gastroenterologist:  Dr. Allen Norris  Pre-Procedure History & Physical: HPI:  Tiffany Bonilla is a 66 y.o. female is here for a screening colonoscopy.   Past Medical History:  Diagnosis Date   Allergy    Asthma    Hyperlipidemia    Hypertension    Pre-diabetes 11/24/2018   Spina bifida occulta    Stroke [facility])    SVT (supraventricular tachycardia) ([place]) 11/24/2018   S/p ablation at [facility]   Wears dentures    full upper    Past Surgical History:  Procedure Laterality Date   CARDIAC ELECTROPHYSIOLOGY STUDY AND ABLATION  2006   for SVT   CATARACT EXTRACTION W/PHACO Right 02/06/2021   Procedure: CATARACT EXTRACTION PHACO AND INTRAOCULAR LENS PLACEMENT ([place]) RIGHT;  Surgeon: Leandrew Koyanagi, MD;  Location: [place];  Service: Ophthalmology;  Laterality: Right;  2.85 0:32.6 8.7%   CATARACT EXTRACTION W/PHACO Left 02/20/2021   Procedure: CATARACT EXTRACTION PHACO AND INTRAOCULAR LENS PLACEMENT ([place]) LEFT;  Surgeon: Leandrew Koyanagi, MD;  Location: [place];  Service: Ophthalmology;  Laterality: Left;  3.12 0:39.5   COLONOSCOPY  2006   CORONARY ANGIOPLASTY WITH STENT PLACEMENT  2001   to Circumfx   ESOPHAGOGASTRODUODENOSCOPY  2017   LAPAROSCOPIC CHOLECYSTECTOMY  04/25/2019   @ [facility]   LAPAROSCOPIC GASTRIC SLEEVE RESECTION  11/07/2015   TOTAL ABDOMINAL HYSTERECTOMY  1992   CIS cervix    Prior to Admission medications   Medication Sig Start Date End Date Taking? Authorizing Provider  aspirin EC 81 MG tablet Take 1 tablet by mouth daily.   Yes [provider]  Black Elderberry 50 MG/5ML SYRP Take by mouth.   Yes [provider]  cholecalciferol (VITAMIN D3) 25 MCG (1000 UNIT) tablet Take 2,000 Units by mouth daily.   Yes [provider]  clobetasol cream  (TEMOVATE) 0.05 % APPLY TOPICALLY TO THE AFFECTED AREA TWICE DAILY 08/09/21  Yes Glean Hess, MD  escitalopram (LEXAPRO) 20 MG tablet Take 1 tablet (20 mg total) by mouth daily. 04/18/21  Yes Glean Hess, MD  gabapentin (NEURONTIN) 100 MG capsule TAKE 2 CAPSULES(200 MG) BY MOUTH IN THE MORNING AND AT BEDTIME FOR TREMORS 07/05/21  Yes Glean Hess, MD  lisinopril (ZESTRIL) 10 MG tablet Take 1 tablet (10 mg total) by mouth daily. 05/04/21  Yes Glean Hess, MD  Melatonin 10 MG TABS Take 1 tablet by mouth at bedtime.   Yes [provider]  Multiple Vitamins-Minerals (ALIVE WOMENS ENERGY) TABS Take by mouth.   Yes [provider]  rosuvastatin (CRESTOR) 40 MG tablet TAKE 1 TABLET(40 MG) BY MOUTH DAILY 07/11/21  Yes Glean Hess, MD    Allergies as of 09/06/2021 - Review Complete 09/06/2021  Allergen Reaction Noted   Morphine Hives and Shortness Of Breath 09/02/2015   Scopolamine Other (See Comments) 11/25/2018   Codeine Nausea Only and Other (See Comments) 09/02/2015   Sulfa antibiotics Hives and Nausea Only 09/02/2015    Family History  Problem Relation Age of Onset   Aneurysm Mother    Breast cancer Maternal Aunt     Social History   Socioeconomic History   Marital status: Widowed    Spouse name: Not on file   Number of children: 5   Years of education: Not on file   Highest education level: Not  on file  Occupational History   Not on file  Tobacco Use   Smoking status: Every Day    Packs/day: 0.50    Years: 40.00    Pack years: 20.00    Types: Cigarettes    Start date: 43   Smokeless tobacco: Never  Vaping Use   Vaping Use: Never used  Substance and Sexual Activity   Alcohol use: Yes    Comment: rare   Drug use: Never   Sexual activity: Not Currently  Other Topics Concern   Not on file  Social History Narrative   Pt lives with 2 of her daughters and 2 grandchildren   Social Determinants of Health   Financial Resource  Strain: Low Risk    Difficulty of Paying Living Expenses: Not hard at all  Food Insecurity: No Food Insecurity   Worried About Charity fundraiser in the Last Year: Never true   Arboriculturist in the Last Year: Never true  Transportation Needs: No Transportation Needs   Lack of Transportation (Medical): No   Lack of Transportation (Non-Medical): No  Physical Activity: Inactive   Days of Exercise per Week: 0 days   Minutes of Exercise per Session: 0 min  Stress: No Stress Concern Present   Feeling of Stress : Not at all  Social Connections: Socially Isolated   Frequency of Communication with Friends and Family: More than three times a week   Frequency of Social Gatherings with Friends and Family: More than three times a week   Attends Religious Services: Never   Marine scientist or Organizations: No   Attends Archivist Meetings: Never   Marital Status: Widowed  Human resources officer Violence: Not At Risk   Fear of Current or Ex-Partner: No   Emotionally Abused: No   Physically Abused: No   Sexually Abused: No    Review of Systems: See HPI, otherwise negative ROS  Physical Exam: BP (!) 149/84    Pulse 62    Temp (!) 97 F (36.1 C) (Temporal)    Resp 18    Ht 5\' 2"  (1.575 m)    Wt 58.5 kg    SpO2 95%    BMI 23.58 kg/m  General:   Alert,  pleasant and cooperative in NAD Head:  Normocephalic and atraumatic. Neck:  Supple; no masses or thyromegaly. Lungs:  Clear throughout to auscultation.    Heart:  Regular rate and rhythm. Abdomen:  Soft, nontender and nondistended. Normal bowel sounds, without guarding, and without rebound.   Neurologic:  Alert and  oriented x4;  grossly normal neurologically.  Impression/Plan: Tiffany Bonilla is now here to undergo a screening colonoscopy.  Risks, benefits, and alternatives regarding colonoscopy have been reviewed with the patient.  Questions have been answered.  All parties agreeable.

## 2021-10-25 NOTE — Op Note (Signed)
[facility] Patient Name: Tiffany Bonilla Procedure Date: 10/25/2021 10:40 AM MRN: [id number] Account #: [id number] Date of Birth: [birth year] Admit Type: Outpatient Age: 66 Room: [facility] OR ROOM 01 Gender: Female Note Status: Finalized Instrument Name: [id number] Procedure:             Colonoscopy Indications:           Screening for colorectal malignant neoplasm Providers:             [name] MD, MD Referring MD:          Halina Maidens, MD (Referring MD) Medicines:             Propofol per Anesthesia Complications:         No immediate complications. Procedure:             Pre-Anesthesia Assessment:                        - Prior to the procedure, a History and Physical was                         performed, and patient medications and allergies were                         reviewed. The patient's tolerance of previous                         anesthesia was also reviewed. The risks and benefits                         of the procedure and the sedation options and risks                         were discussed with the patient. All questions were                         answered, and informed consent was obtained. Prior                         Anticoagulants: The patient has taken no previous                         anticoagulant or antiplatelet agents. ASA Grade                         Assessment: II - A patient with mild systemic disease.                         After reviewing the risks and benefits, the patient                         was deemed in satisfactory condition to undergo the                         procedure.                        After obtaining informed consent, the colonoscope was  passed under direct vision. Throughout the procedure,                         the patient's blood pressure, pulse, and oxygen                         saturations were monitored continuously. The was                          introduced through the anus and advanced to the the                         cecum, identified by appendiceal orifice and ileocecal                         valve. The colonoscopy was performed without                         difficulty. The patient tolerated the procedure well.                         The quality of the bowel preparation was good. Findings:      The perianal and digital rectal examinations were normal.      Two sessile polyps were found in the cecum. The polyps were 3 to 5 mm in       size. These polyps were removed with a cold snare. Resection and       retrieval were complete.      Three sessile polyps were found in the transverse colon. The polyps were       3 to 6 mm in size. These polyps were removed with a cold snare.       Resection and retrieval were complete.      A 4 mm polyp was found in the descending colon. The polyp was sessile.       The polyp was removed with a cold snare. Resection and retrieval were       complete.      Non-bleeding internal hemorrhoids were found during retroflexion. The       hemorrhoids were Grade I (internal hemorrhoids that do not prolapse). Impression:            - Two 3 to 5 mm polyps in the cecum, removed with a                         cold snare. Resected and retrieved.                        - Three 3 to 6 mm polyps in the transverse colon,                         removed with a cold snare. Resected and retrieved.                        - One 4 mm polyp in the descending colon, removed with                         a cold snare. Resected and retrieved.                        -  Non-bleeding internal hemorrhoids. Recommendation:        - Discharge patient to home.                        - Resume previous diet.                        - Continue present medications.                        - Await pathology results.                        - If the pathology report reveals adenomatous tissue,                         then repeat the  colonoscopy for surveillance in 3                         years. Procedure Code(s):     --- Professional ---                        [phone], Colonoscopy, flexible; with removal of                         tumor(s), polyp(s), or other lesion(s) by snare                         technique Diagnosis Code(s):     --- Professional ---                        Z12.11, Encounter for screening for malignant neoplasm                         of colon                        K63.5, Polyp of colon CPT copyright 2019 American Medical Association. All rights reserved. The codes documented in this report are preliminary and upon coder review may  be revised to meet current compliance requirements. [name] MD, MD 10/25/2021 11:07:59 AM This report has been signed electronically. Number of Addenda: 0 Note Initiated On: 10/25/2021 10:40 AM Scope Withdrawal Time: 0 hours 10 minutes 23 seconds  Total Procedure Duration: 0 hours 14 minutes 42 seconds  Estimated Blood Loss:  Estimated blood loss: none.      [facility]

## 2021-10-28 ENCOUNTER — Encounter: Payer: Self-pay | Admitting: Gastroenterology

## 2021-10-28 LAB — SURGICAL PATHOLOGY

## 2021-10-29 ENCOUNTER — Encounter: Payer: Self-pay | Admitting: Gastroenterology

## 2021-11-07 ENCOUNTER — Ambulatory Visit: Payer: Medicare HMO | Admitting: Internal Medicine

## 2021-11-09 ENCOUNTER — Other Ambulatory Visit: Payer: Self-pay | Admitting: Internal Medicine

## 2021-11-09 DIAGNOSIS — F418 Other specified anxiety disorders: Secondary | ICD-10-CM

## 2021-11-11 NOTE — Telephone Encounter (Signed)
Requested Prescriptions  ?Pending Prescriptions Disp Refills  ?? escitalopram (LEXAPRO) 20 MG tablet [Pharmacy Med Name: ESCITALOPRAM '20MG'$  TABLETS] 90 tablet 1  ?  Sig: TAKE 1 TABLET(20 MG) BY MOUTH DAILY  ?  ? Psychiatry:  Antidepressants - SSRI Passed - 11/09/2021  1:47 PM  ?  ?  Passed - Valid encounter within last 6 months  ?  Recent Outpatient Visits   ?      ? 4 months ago Right hip pain  ? [facility] Glean Hess, MD  ? 6 months ago Annual physical exam  ? [facility] Glean Hess, MD  ? 7 months ago Essential hypertension  ? [facility] Montel Culver, MD  ? 8 months ago Essential hypertension  ? [facility] Glean Hess, MD  ? 1 year ago Essential hypertension  ? [facility] Glean Hess, MD  ?  ?  ?Future Appointments   ?        ? In 2 days Glean Hess, MD [facility], [place]  ? In 5 months Army Melia Jesse Sans, MD [facility], [place]  ?  ? ?  ?  ?  ? ?

## 2021-11-13 ENCOUNTER — Ambulatory Visit: Payer: Medicare HMO | Admitting: Internal Medicine

## 2022-01-10 ENCOUNTER — Other Ambulatory Visit: Payer: Self-pay

## 2022-01-10 ENCOUNTER — Ambulatory Visit
Admission: EM | Admit: 2022-01-10 | Discharge: 2022-01-10 | Disposition: A | Discharge status: Home / Self Care | Payer: Medicare HMO | Attending: Physician Assistant | Admitting: Physician Assistant

## 2022-01-10 DIAGNOSIS — J441 Chronic obstructive pulmonary disease with (acute) exacerbation: Secondary | ICD-10-CM | POA: Insufficient documentation

## 2022-01-10 DIAGNOSIS — R0602 Shortness of breath: Secondary | ICD-10-CM | POA: Diagnosis not present

## 2022-01-10 DIAGNOSIS — R051 Acute cough: Secondary | ICD-10-CM | POA: Diagnosis not present

## 2022-01-10 DIAGNOSIS — Z20822 Contact with and (suspected) exposure to covid-19: Secondary | ICD-10-CM | POA: Diagnosis not present

## 2022-01-10 MED ORDER — DOXYCYCLINE HYCLATE 100 MG PO CAPS: 100.0000 mg | ORAL_CAPSULE | Freq: Two times a day (BID) | ORAL | 0 refills | Status: AC

## 2022-01-10 MED ORDER — ALBUTEROL SULFATE HFA 108 (90 BASE) MCG/ACT IN AERS
1.0000 | INHALATION_SPRAY | Freq: Four times a day (QID) | RESPIRATORY_TRACT | 1 refills | Status: DC | PRN
Start: 2022-01-10 — End: 2022-04-24

## 2022-01-10 NOTE — Discharge Instructions (Addendum)
-  Your COVID test will be back in 6 to 24 hours.  We will call you if it is positive.  If positive I can send antiviral medicine for you.  You will need to isolate 5 days from symptom onset and wear mask x5 days if positive.  If you do not hear from me, it is likely negative but you can check your MyChart. ?- You are having an exacerbation of your COPD which could be secondary to a virus or bacteria.  I have sent antibiotics to pharmacy for you I would also advise you start taking Mucinex DM over-the-counter or Coricidin HBP if you can find that in pill form given your preferences. ?- Increase rest and fluids. ?- I sent an inhaler for you to use every 4-6 hours as needed for wheezing and shortness of breath. ?- Go to emergency department or call 911 if you have any uncontrollable fever, weakness, chest pain, increased breathing difficulty not relieved by use of your inhaler. ?-Your blood pressure is elevated today.  Speak with your doctor about altering your medication as you may need something stronger. ?

## 2022-01-10 NOTE — ED Triage Notes (Signed)
Pt c/o cough with green phlegm, chest congestion, tightness when coughing, SOB when walking. Pt does have COPD and does not have an inhaler.  ? ?Pt took a covid test and it was negative. ?

## 2022-01-10 NOTE — ED Provider Notes (Signed)
?[place] ? ? ? ?CSN: [id number] ?Arrival date & time: 01/10/22  1801 ? ? ?  ? ?History   ?Chief Complaint ?Chief Complaint  ?Patient presents with  ? Cough  ? ? ?HPI ?Tiffany Bonilla is a 66 y.o. female presenting for approximately 3-day history of fatigue, nasal congestion, postnasal drainage, cough that is productive of greenish sputum, chest tightness and pain when coughing, shortness of breath especially on exertion.  Denies associated fever.  No sick contacts.  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  States she was tested this previous Monday but does not think she was sick at that time.  She says it was negative though.  History of COVID-19 in 2020.  Patient does have history of emphysema.  States she does not have an inhaler as her COPD has been generally mild.  She has not really tried any OTC cough meds because she says she cannot take any liquids.  Other medical history significant for asthma, hypertension, prediabetes, previous stroke, hepatitis C and AAA. ? ?HPI ? ?Past Medical History:  ?Diagnosis Date  ? Allergy   ? Asthma   ? Hyperlipidemia   ? Hypertension   ? Pre-diabetes 11/24/2018  ? Spina bifida occulta   ? Stroke [facility])   ? SVT (supraventricular tachycardia) ([place]) 11/24/2018  ? S/p ablation [facility]  ? Wears dentures   ? full upper  ? ? ?Patient Active Problem List  ? Diagnosis Date Noted  ? Colon cancer screening   ? Polyp of descending colon   ? Right hip pain 06/19/2021  ? Chronic midline low back pain without sciatica 06/19/2021  ? Chronic kidney disease, stage 3b ([place]) 04/19/2021  ? Diarrhea 04/07/2021  ? Elevated troponin 04/07/2021  ? Postural dizziness with presyncope 04/07/2021  ? Atrophic kidney 09/27/2020  ? Aortic aneurysm, abdominal ([place]) 09/27/2020  ? Benign essential tremor 04/09/2020  ? Vitamin D deficiency 09/06/2019  ? Other specified anxiety disorders 02/16/2019  ? Lichen sclerosus et atrophicus of the vulva 11/25/2018   ? Essential hypertension 11/24/2018  ? Old lacunar stroke without late effect 11/24/2018  ? Current every day smoker 10/25/2015  ? Hx of hepatitis C 10/25/2015  ? CAD in native artery 05/24/2015  ? Mixed hyperlipidemia 06/12/2011  ? ? ?Past Surgical History:  ?Procedure Laterality Date  ? CARDIAC ELECTROPHYSIOLOGY STUDY AND ABLATION  2006  ? for SVT  ? CATARACT EXTRACTION W/PHACO Right 02/06/2021  ? Procedure: CATARACT EXTRACTION PHACO AND INTRAOCULAR LENS PLACEMENT ([place]) RIGHT;  Surgeon: Leandrew Koyanagi, MD;  Location: [place];  Service: Ophthalmology;  Laterality: Right;  2.85 ?0:32.6 ?8.7%  ? CATARACT EXTRACTION W/PHACO Left 02/20/2021  ? Procedure: CATARACT EXTRACTION PHACO AND INTRAOCULAR LENS PLACEMENT ([place]) LEFT;  Surgeon: Leandrew Koyanagi, MD;  Location: [place];  Service: Ophthalmology;  Laterality: Left;  3.12 ?0:39.5  ? COLONOSCOPY  2006  ? COLONOSCOPY WITH PROPOFOL N/A 10/25/2021  ? Procedure: COLONOSCOPY WITH BIOPSY;  Surgeon: [name], MD;  Location: [place];  Service: Endoscopy;  Laterality: N/A;  ? CORONARY ANGIOPLASTY WITH STENT PLACEMENT  2001  ? to Circumfx  ? ESOPHAGOGASTRODUODENOSCOPY  2017  ? LAPAROSCOPIC CHOLECYSTECTOMY  04/25/2019  ? @ [facility]  ? LAPAROSCOPIC GASTRIC SLEEVE RESECTION  11/07/2015  ? POLYPECTOMY N/A 10/25/2021  ? Procedure: POLYPECTOMY;  Surgeon: [name], MD;  Location: [place];  Service: Endoscopy;  Laterality: N/A;  ? TOTAL ABDOMINAL HYSTERECTOMY  1992  ?  CIS cervix  ? ? ?OB History   ?No obstetric history on file. ?  ? ? ? ?Home Medications   ? ?Prior to Admission medications   ?Medication Sig Start Date End Date Taking? Authorizing Provider  ?albuterol (VENTOLIN HFA) 108 (90 Base) MCG/ACT inhaler Inhale 1-2 puffs into the lungs every 6 (six) hours as needed for wheezing or shortness of breath. 01/10/22  Yes Laurene Footman B, PA-C  ?aspirin EC 81 MG tablet Take 1 tablet by mouth daily.   Yes [provider]   ?Zenovia Jordan 50 MG/5ML SYRP Take by mouth.   Yes [provider]  ?cholecalciferol (VITAMIN D3) 25 MCG (1000 UNIT) tablet Take 2,000 Units by mouth daily.   Yes [provider]  ?clobetasol cream (TEMOVATE) 0.05 % APPLY TOPICALLY TO THE AFFECTED AREA TWICE DAILY 08/09/21  Yes Glean Hess, MD  ?doxycycline (VIBRAMYCIN) 100 MG capsule Take 1 capsule (100 mg total) by mouth 2 (two) times daily for 7 days. 01/10/22 01/17/22 Yes Laurene Footman B, PA-C  ?escitalopram (LEXAPRO) 20 MG tablet TAKE 1 TABLET(20 MG) BY MOUTH DAILY 11/11/21  Yes Glean Hess, MD  ?gabapentin (NEURONTIN) 100 MG capsule TAKE 2 CAPSULES(200 MG) BY MOUTH IN THE MORNING AND AT BEDTIME FOR TREMORS 07/05/21  Yes Glean Hess, MD  ?lisinopril (ZESTRIL) 10 MG tablet Take 1 tablet (10 mg total) by mouth daily. 05/04/21  Yes Glean Hess, MD  ?Melatonin 10 MG TABS Take 1 tablet by mouth at bedtime.   Yes [provider]  ?Multiple Vitamins-Minerals (ALIVE WOMENS ENERGY) TABS Take by mouth.   Yes [provider]  ?rosuvastatin (CRESTOR) 40 MG tablet TAKE 1 TABLET(40 MG) BY MOUTH DAILY 07/11/21  Yes Glean Hess, MD  ? ? ?Family History ?Family History  ?Problem Relation Age of Onset  ? Aneurysm Mother   ? Breast cancer Maternal Aunt   ? ? ?Social History ?Social History  ? ?Tobacco Use  ? Smoking status: Every Day  ?  Packs/day: 0.50  ?  Years: 40.00  ?  Pack years: 20.00  ?  Types: Cigarettes  ?  Start date: 35  ? Smokeless tobacco: Never  ?Vaping Use  ? Vaping Use: Never used  ?Substance Use Topics  ? Alcohol use: Yes  ?  Comment: rare  ? Drug use: Never  ? ? ? ?Allergies   ?Morphine, Scopolamine, Codeine, and Sulfa antibiotics ? ? ?Review of Systems ?Review of Systems  ?Constitutional:  Positive for fatigue. Negative for chills, diaphoresis and fever.  ?HENT:  Positive for congestion, postnasal drip, rhinorrhea and sore throat. Negative for ear pain, sinus pressure and sinus pain.    ?Respiratory:  Positive for cough, chest tightness and wheezing. Negative for shortness of breath.   ?Gastrointestinal:  Negative for abdominal pain, nausea and vomiting.  ?Musculoskeletal:  Positive for myalgias. Negative for arthralgias.  ?Skin:  Negative for rash.  ?Neurological:  Positive for headaches. Negative for weakness.  ?Hematological:  Negative for adenopathy.  ? ? ?Physical Exam ?Triage Vital Signs ?ED Triage Vitals  ?Enc Vitals Group  ?   BP 01/10/22 1818 (!) 183/93  ?   Pulse Rate 01/10/22 1818 67  ?   Resp 01/10/22 1818 18  ?   Temp 01/10/22 1818 98.4 ?F (36.9 ?C)  ?   Temp Source 01/10/22 1818 Oral  ?   SpO2 01/10/22 1818 96 %  ?   Weight 01/10/22 1817 130 lb (59 kg)  ?   Height  01/10/22 1817 '5\' 1"'$  (1.549 m)  ?   Head Circumference --   ?   Peak Flow --   ?   Pain Score 01/10/22 1817 0  ?   Pain Loc --   ?   Pain Edu? --   ?   Excl. in [place]? --   ? ?No data found. ? ?Updated Vital Signs ?BP (!) 218/87 (BP Location: Left Arm)   Pulse 67   Temp 98.4 ?F (36.9 ?C) (Oral)   Resp 18   Ht '5\' 1"'$  (1.549 m)   Wt 130 lb (59 kg)   SpO2 96%   BMI 24.56 kg/m?  ?  ? ?Physical Exam ?Vitals and nursing note reviewed.  ?Constitutional:   ?   General: She is not in acute distress. ?   Appearance: Normal appearance. She is ill-appearing. She is not toxic-appearing.  ?HENT:  ?   Head: Normocephalic and atraumatic.  ?   Nose: Congestion present.  ?   Mouth/Throat:  ?   Mouth: Mucous membranes are moist.  ?   Pharynx: Oropharynx is clear. Posterior oropharyngeal erythema present.  ?Eyes:  ?   General: No scleral icterus.    ?   Right eye: No discharge.     ?   Left eye: No discharge.  ?   Conjunctiva/sclera: Conjunctivae normal.  ?Cardiovascular:  ?   Rate and Rhythm: Normal rate and regular rhythm.  ?   Heart sounds: Normal heart sounds.  ?Pulmonary:  ?   Effort: Pulmonary effort is normal. No respiratory distress.  ?   Breath sounds: Wheezing and rhonchi present.  ?   Comments: Diffuse wheezes and rhonchi  throughout all lung fields. ?Musculoskeletal:  ?   Cervical back: Neck supple.  ?Skin: ?   General: Skin is dry.  ?Neurological:  ?   General: No focal deficit present.  ?   Mental Status: She is alert. Mental status is at baselin

## 2022-01-11 LAB — SARS CORONAVIRUS 2 (TAT 6-24 HRS): SARS Coronavirus 2: NEGATIVE

## 2022-03-18 ENCOUNTER — Other Ambulatory Visit: Payer: Self-pay

## 2022-03-18 DIAGNOSIS — F418 Other specified anxiety disorders: Secondary | ICD-10-CM

## 2022-03-18 MED ORDER — ESCITALOPRAM OXALATE 20 MG PO TABS: ORAL_TABLET | ORAL | 0 refills | Status: DC

## 2022-04-16 ENCOUNTER — Other Ambulatory Visit: Payer: Self-pay | Admitting: Internal Medicine

## 2022-04-16 DIAGNOSIS — E782 Mixed hyperlipidemia: Secondary | ICD-10-CM

## 2022-04-16 NOTE — Telephone Encounter (Signed)
Requested Prescriptions  Pending Prescriptions Disp Refills  . rosuvastatin (CRESTOR) 40 MG tablet [Pharmacy Med Name: ROSUVASTATIN '40MG'$  TABLETS] 90 tablet 0    Sig: TAKE 1 TABLET(40 MG) BY MOUTH DAILY     Cardiovascular:  Antilipid - Statins 2 Failed - 04/16/2022 12:17 PM      Failed - Cr in normal range and within 360 days    Creatinine, Ser  Date Value Ref Range Status  04/18/2021 1.44 (H) 0.57 - 1.00 mg/dL Final         Failed - Lipid Panel in normal range within the last 12 months    Cholesterol, Total  Date Value Ref Range Status  04/18/2021 219 (H) 100 - 199 mg/dL Final   LDL Chol Calc (NIH)  Date Value Ref Range Status  04/18/2021 142 (H) 0 - 99 mg/dL Final   HDL  Date Value Ref Range Status  04/18/2021 55 >39 mg/dL Final   Triglycerides  Date Value Ref Range Status  04/18/2021 122 0 - 149 mg/dL Final         Passed - Patient is not pregnant      Passed - Valid encounter within last 12 months    Recent Outpatient Visits          10 months ago Right hip pain   [facility] [facility] at [facility], Jesse Sans, MD   12 months ago Annual physical exam   [facility] at [facility] Jesse Sans, MD   1 year ago Essential hypertension   [facility] at [place], Earley Abide, MD   1 year ago Essential hypertension   [place] [facility] at [facility], Jesse Sans, MD   1 year ago Essential hypertension   [place] [facility] at [facility], Jesse Sans, MD      Future Appointments            In 1 week [facility], Jesse Sans, MD [place] [facility] at [facility], [facility]

## 2022-04-18 ENCOUNTER — Other Ambulatory Visit: Payer: Self-pay | Admitting: Internal Medicine

## 2022-04-18 DIAGNOSIS — E782 Mixed hyperlipidemia: Secondary | ICD-10-CM

## 2022-04-18 DIAGNOSIS — F418 Other specified anxiety disorders: Secondary | ICD-10-CM

## 2022-04-24 ENCOUNTER — Ambulatory Visit (INDEPENDENT_AMBULATORY_CARE_PROVIDER_SITE_OTHER): Payer: Medicare HMO | Admitting: Internal Medicine

## 2022-04-24 ENCOUNTER — Encounter: Payer: Self-pay | Admitting: Internal Medicine

## 2022-04-24 VITALS — BP 122/78 | HR 66 | Ht 61.0 in | Wt 138.0 lb

## 2022-04-24 DIAGNOSIS — G250 Essential tremor: Secondary | ICD-10-CM | POA: Diagnosis not present

## 2022-04-24 DIAGNOSIS — F418 Other specified anxiety disorders: Secondary | ICD-10-CM

## 2022-04-24 DIAGNOSIS — I7143 Infrarenal abdominal aortic aneurysm, without rupture: Secondary | ICD-10-CM | POA: Diagnosis not present

## 2022-04-24 DIAGNOSIS — E782 Mixed hyperlipidemia: Secondary | ICD-10-CM

## 2022-04-24 DIAGNOSIS — Z0000 Encounter for general adult medical examination without abnormal findings: Secondary | ICD-10-CM

## 2022-04-24 DIAGNOSIS — R7989 Other specified abnormal findings of blood chemistry: Secondary | ICD-10-CM | POA: Diagnosis not present

## 2022-04-24 DIAGNOSIS — I10 Essential (primary) hypertension: Secondary | ICD-10-CM

## 2022-04-24 DIAGNOSIS — J432 Centrilobular emphysema: Secondary | ICD-10-CM | POA: Diagnosis not present

## 2022-04-24 DIAGNOSIS — E538 Deficiency of other specified B group vitamins: Secondary | ICD-10-CM | POA: Diagnosis not present

## 2022-04-24 DIAGNOSIS — Z1231 Encounter for screening mammogram for malignant neoplasm of breast: Secondary | ICD-10-CM | POA: Diagnosis not present

## 2022-04-24 DIAGNOSIS — E559 Vitamin D deficiency, unspecified: Secondary | ICD-10-CM

## 2022-04-24 DIAGNOSIS — N1832 Chronic kidney disease, stage 3b: Secondary | ICD-10-CM

## 2022-04-24 DIAGNOSIS — F17200 Nicotine dependence, unspecified, uncomplicated: Secondary | ICD-10-CM | POA: Diagnosis not present

## 2022-04-24 DIAGNOSIS — I2510 Atherosclerotic heart disease of native coronary artery without angina pectoris: Secondary | ICD-10-CM | POA: Diagnosis not present

## 2022-04-24 MED ORDER — ALBUTEROL SULFATE HFA 108 (90 BASE) MCG/ACT IN AERS: 2.0000 | INHALATION_SPRAY | Freq: Four times a day (QID) | RESPIRATORY_TRACT | 0 refills | Status: DC | PRN

## 2022-04-24 MED ORDER — ESCITALOPRAM OXALATE 20 MG PO TABS: ORAL_TABLET | ORAL | 1 refills | Status: DC

## 2022-04-24 MED ORDER — GABAPENTIN 100 MG PO CAPS: 200.0000 mg | ORAL_CAPSULE | Freq: Two times a day (BID) | ORAL | 1 refills | Status: DC

## 2022-04-24 NOTE — Progress Notes (Signed)
Date:  04/24/2022   Name:  Tiffany Bonilla   DOB:  [birth year]   MRN:  [id number]   Chief Complaint: Annual Exam (Breast Exam.) Tiffany Bonilla is a 66 y.o. female who presents today for her Complete Annual Exam. She feels fairly well. She reports exercising. She reports she is sleeping well. Breast complaints - none.  Mammogram: 04/2021 DEXA: 04/2021 osteopenia arm o/w normal Pap smear: discontinued Colonoscopy: 10/2021 repeat 3 yr  Health Maintenance Due  Topic Date Due   Zoster Vaccines- Shingrix (2 of 2) 06/13/2021   COVID-19 Vaccine (5 - Moderna series) 09/27/2021   INFLUENZA VACCINE  04/01/2022    Immunization History  Administered Date(s) Administered   Fluad Quad(high Dose 65+) 10/29/2020   Influenza,inj,Quad PF,6+ Mos 07/13/2019   Influenza-Unspecified 06/28/2013, 06/18/2015   Moderna Sars-Covid-2 Vaccination 09/14/2019, 10/24/2019, 10/02/2020   Pfizer Covid-19 Vaccine Bivalent Booster 46yr & up 05/28/2021   Pneumococcal Conjugate-13 10/29/2020   Pneumococcal Polysaccharide-23 06/29/2015, 11/25/2018   Pneumococcal-Unspecified 01/13/2011   Tdap 10/31/2010   Zoster Recombinat (Shingrix) 04/18/2021    Hypertension This is a chronic problem. The problem is controlled. Associated symptoms include anxiety and shortness of breath. Pertinent negatives include no chest pain, headaches or palpitations. Past treatments include ACE inhibitors. The current treatment provides moderate improvement. Hypertensive end-organ damage includes kidney disease, CAD/MI and CVA. s/p Stent, also has aortic aneurysm.  Hyperlipidemia This is a chronic problem. The problem is resistant. Associated symptoms include shortness of breath. Pertinent negatives include no chest pain. Current antihyperlipidemic treatment includes statins. The current treatment provides moderate improvement of lipids.  Anxiety Presents for follow-up visit. Symptoms include shortness of breath. Patient reports no  chest pain, dizziness, nervous/anxious behavior or palpitations. The severity of symptoms is mild. The quality of sleep is good.   Compliance with medications is 76-100%.  Shortness of Breath This is a recurrent problem. The problem occurs intermittently. Associated symptoms include wheezing (since URI and smoking). Pertinent negatives include no abdominal pain, chest pain, fever, headaches, leg swelling, rash or vomiting. Risk factors include smoking. She has tried beta agonist inhalers for the symptoms.    Lab Results  Component Value Date   NA 139 04/18/2021   K 3.8 04/18/2021   CO2 24 04/18/2021   GLUCOSE 90 04/18/2021   BUN 24 04/18/2021   CREATININE 1.44 (H) 04/18/2021   CALCIUM 9.8 04/18/2021   EGFR 40 (L) 04/18/2021   GFRNONAA 50 (L) 04/09/2020   Lab Results  Component Value Date   CHOL 219 (H) 04/18/2021   HDL 55 04/18/2021   LDLCALC 142 (H) 04/18/2021   TRIG 122 04/18/2021   CHOLHDL 4.0 04/18/2021   Lab Results  Component Value Date   TSH 1.130 04/18/2021   Lab Results  Component Value Date   HGBA1C 5.5 02/16/2019   Lab Results  Component Value Date   WBC 9.7 04/18/2021   HGB 15.0 04/18/2021   HCT 43.7 04/18/2021   MCV 91 04/18/2021   PLT 226 04/18/2021   Lab Results  Component Value Date   ALT 11 04/18/2021   AST 19 04/18/2021   ALKPHOS 50 04/18/2021   BILITOT 0.4 04/18/2021   Lab Results  Component Value Date   VD25OH 37.0 04/09/2020     Review of Systems  Constitutional:  Negative for chills, fatigue and fever.  HENT:  Negative for congestion, hearing loss, tinnitus, trouble swallowing and voice change.   Eyes:  Negative for visual disturbance.  Respiratory:  Positive for  shortness of breath and wheezing (since URI and smoking). Negative for cough and chest tightness.   Cardiovascular:  Negative for chest pain, palpitations and leg swelling.  Gastrointestinal:  Negative for abdominal pain, constipation, diarrhea and vomiting.  Endocrine:  Negative for polydipsia and polyuria.  Genitourinary:  Negative for dysuria, frequency, genital sores, vaginal bleeding and vaginal discharge.  Musculoskeletal:  Negative for arthralgias, gait problem and joint swelling.  Skin:  Negative for color change and rash.  Neurological:  Negative for dizziness, tremors, light-headedness and headaches.  Hematological:  Negative for adenopathy. Does not bruise/bleed easily.  Psychiatric/Behavioral:  Negative for dysphoric mood and sleep disturbance. The patient is not nervous/anxious.     Patient Active Problem List   Diagnosis Date Noted   Colon cancer screening    Polyp of descending colon    Right hip pain 06/19/2021   Chronic midline low back pain without sciatica 06/19/2021   Chronic kidney disease, stage 3b ([place]) 04/19/2021   Diarrhea 04/07/2021   Elevated troponin 04/07/2021   Postural dizziness with presyncope 04/07/2021   Atrophic kidney 09/27/2020   Aortic aneurysm, abdominal ([place]) 09/27/2020   Benign essential tremor 04/09/2020   Vitamin D deficiency 09/06/2019   Other specified anxiety disorders 31/49/7026   Lichen sclerosus et atrophicus of the vulva 11/25/2018   Essential hypertension 11/24/2018   Old lacunar stroke without late effect 11/24/2018   Current every day smoker 10/25/2015   Hx of hepatitis C 10/25/2015   CAD in native artery 05/24/2015   Mixed hyperlipidemia 06/12/2011    Allergies  Allergen Reactions   Morphine Hives and Shortness Of Breath    Stopped breathing    Scopolamine Other (See Comments)    Unconscious    Codeine Nausea Only and Other (See Comments)    NAUSEA/VOMITING    Sulfa Antibiotics Hives and Nausea Only    Other Reaction: Not Assessed     Past Surgical History:  Procedure Laterality Date   CARDIAC ELECTROPHYSIOLOGY STUDY AND ABLATION  2006   for SVT   CATARACT EXTRACTION W/PHACO Right 02/06/2021   Procedure: CATARACT EXTRACTION PHACO AND INTRAOCULAR LENS PLACEMENT ([place]) RIGHT;   Surgeon: Leandrew Koyanagi, MD;  Location: [place];  Service: Ophthalmology;  Laterality: Right;  2.85 0:32.6 8.7%   CATARACT EXTRACTION W/PHACO Left 02/20/2021   Procedure: CATARACT EXTRACTION PHACO AND INTRAOCULAR LENS PLACEMENT ([place]) LEFT;  Surgeon: Leandrew Koyanagi, MD;  Location: [place];  Service: Ophthalmology;  Laterality: Left;  3.12 0:39.5   COLONOSCOPY  2006   COLONOSCOPY WITH PROPOFOL N/A 10/25/2021   Procedure: COLONOSCOPY WITH BIOPSY;  Surgeon: [name], MD;  Location: [place];  Service: Endoscopy;  Laterality: N/A;   CORONARY ANGIOPLASTY WITH STENT PLACEMENT  2001   to Circumfx   ESOPHAGOGASTRODUODENOSCOPY  2017   LAPAROSCOPIC CHOLECYSTECTOMY  04/25/2019   @ [place] RESECTION  11/07/2015   POLYPECTOMY N/A 10/25/2021   Procedure: POLYPECTOMY;  Surgeon: [name], MD;  Location: [place];  Service: Endoscopy;  Laterality: N/A;   TOTAL ABDOMINAL HYSTERECTOMY  1992   CIS cervix    Social History   Tobacco Use   Smoking status: Some Days    Packs/day: 0.50    Years: 40.00    Total pack years: 20.00    Types: Cigarettes    Start date: 30   Smokeless tobacco: Never  Vaping Use   Vaping Use: Never used  Substance Use Topics   Alcohol use: Not Currently  Comment: rare   Drug use: Never     Medication list has been reviewed and updated.  Current Meds  Medication Sig   albuterol (VENTOLIN HFA) 108 (90 Base) MCG/ACT inhaler Inhale 1-2 puffs into the lungs every 6 (six) hours as needed for wheezing or shortness of breath.   aspirin EC 81 MG tablet Take 1 tablet by mouth daily.   Black Elderberry 50 MG/5ML SYRP Take by mouth.   cholecalciferol (VITAMIN D3) 25 MCG (1000 UNIT) tablet Take 2,000 Units by mouth daily.   clobetasol cream (TEMOVATE) 0.05 % APPLY TOPICALLY TO THE AFFECTED AREA TWICE DAILY   escitalopram (LEXAPRO) 20 MG tablet TAKE 1 TABLET(20 MG) BY MOUTH DAILY    gabapentin (NEURONTIN) 100 MG capsule TAKE 2 CAPSULES(200 MG) BY MOUTH IN THE MORNING AND AT BEDTIME FOR TREMORS   lisinopril (ZESTRIL) 10 MG tablet Take 1 tablet (10 mg total) by mouth daily.   Melatonin 10 MG TABS Take 1 tablet by mouth at bedtime.   Multiple Vitamins-Minerals (ALIVE WOMENS ENERGY) TABS Take by mouth.   rosuvastatin (CRESTOR) 40 MG tablet TAKE 1 TABLET(40 MG) BY MOUTH DAILY       04/24/2022    8:32 AM 06/19/2021    1:42 PM 04/18/2021    9:54 AM 04/09/2021    8:44 AM  GAD 7 : Generalized Anxiety Score  Nervous, Anxious, on Edge 0 0 0 1  Control/stop worrying 0 0 0 2  Worry too much - different things 0 0 1 2  Trouble relaxing 0 0 0 2  Restless 0 0 0 0  Easily annoyed or irritable 0 0 0 0  Afraid - awful might happen 0 0 0 0  Total GAD 7 Score 0 0 1 7  Anxiety Difficulty Not difficult at all Not difficult at all         04/24/2022    8:32 AM 08/21/2021    2:13 PM 06/19/2021    1:42 PM  Depression screen PHQ 2/9  Decreased Interest 0 0 0  Down, Depressed, Hopeless 0 0 0  PHQ - 2 Score 0 0 0  Altered sleeping 0  0  Tired, decreased energy 0  0  Change in appetite 0  0  Feeling bad or failure about yourself  0  0  Trouble concentrating 0  0  Moving slowly or fidgety/restless 0  0  Suicidal thoughts 0  0  PHQ-9 Score 0  0  Difficult doing work/chores Not difficult at all  Not difficult at all    BP Readings from Last 3 Encounters:  04/24/22 122/78  01/10/22 (!) 218/87  10/25/21 (!) 145/69    Physical Exam Vitals and nursing note reviewed.  Constitutional:      General: She is not in acute distress.    Appearance: She is well-developed.  HENT:     Head: Normocephalic and atraumatic.     Right Ear: Tympanic membrane and ear canal normal.     Left Ear: Tympanic membrane and ear canal normal.     Nose:     Right Sinus: No maxillary sinus tenderness.     Left Sinus: No maxillary sinus tenderness.  Eyes:     General: No scleral icterus.       Right  eye: No discharge.        Left eye: No discharge.     Conjunctiva/sclera: Conjunctivae normal.  Neck:     Thyroid: No thyromegaly.     Vascular: No carotid bruit.  Cardiovascular:     Rate and Rhythm: Normal rate and regular rhythm.     Pulses: Normal pulses.     Heart sounds: Normal heart sounds.  Pulmonary:     Effort: Pulmonary effort is normal. No respiratory distress.     Breath sounds: No wheezing.  Chest:  Breasts:    Right: No mass, nipple discharge, skin change or tenderness.     Left: No mass, nipple discharge, skin change or tenderness.  Abdominal:     General: Bowel sounds are normal.     Palpations: Abdomen is soft.     Tenderness: There is no abdominal tenderness.  Musculoskeletal:     Cervical back: Normal range of motion. No erythema.     Right lower leg: No edema.     Left lower leg: No edema.  Lymphadenopathy:     Cervical: No cervical adenopathy.  Skin:    General: Skin is warm and dry.     Findings: No rash.  Neurological:     Mental Status: She is alert and oriented to person, place, and time.     Cranial Nerves: No cranial nerve deficit.     Sensory: No sensory deficit.     Deep Tendon Reflexes: Reflexes are normal and symmetric.  Psychiatric:        Attention and Perception: Attention normal.        Mood and Affect: Mood normal.     Wt Readings from Last 3 Encounters:  04/24/22 138 lb (62.6 kg)  01/10/22 130 lb (59 kg)  10/25/21 128 lb 14.4 oz (58.5 kg)    BP 122/78   Pulse 66   Ht '5\' 1"'  (1.549 m)   Wt 138 lb (62.6 kg)   SpO2 94%   BMI 26.07 kg/m   Assessment and Plan: 1. Annual physical exam Normal exam. Up to date on screenings and immunizations. Continue healthy diet and exercise  2. Encounter for screening mammogram for breast cancer - MM 3D SCREEN BREAST BILATERAL  3. Essential hypertension Clinically stable exam with well controlled BP. Tolerating medications without side effects at this time. Pt to continue current  regimen and low sodium diet; benefits of regular exercise as able discussed. - CBC with Differential/Platelet - TSH  4. CAD in native artery Last LDL was not at goal on highest dose Crestor Recommend Repatha if not improved - Lipid panel  5. Chronic kidney disease, stage 3b (HCC) Continue to monitor Avoid nsaids - Comprehensive metabolic panel  6. Mixed hyperlipidemia - Lipid panel  7. Vitamin D deficiency Continue daily supplementation - VITAMIN D 25 Hydroxy (Vit-D Deficiency, Fractures)  8. Infrarenal abdominal aortic aneurysm (AAA) without rupture ([place]) Due for repeat US in 2 years  9. Current every day smoker She continues to smoke Due for annual Lung cancer screening - Ambulatory Referral Lung Cancer Screening [facility]  10. Centrilobular emphysema (HCC) Minimal symptoms but would like to have albuterol on hand - albuterol (VENTOLIN HFA) 108 (90 Base) MCG/ACT inhaler; Inhale 2 puffs into the lungs every 6 (six) hours as needed for wheezing or shortness of breath.  Dispense: 1 each; Refill: 0  11. B12 nutritional deficiency Previously on B12 injections - will check levels and advise - Vitamin B12  12. Benign essential tremor controlled - gabapentin (NEURONTIN) 100 MG capsule; Take 2 capsules (200 mg total) by mouth 2 (two) times daily.  Dispense: 360 capsule; Refill: 1  13. Other specified anxiety disorders Doing well on Lexapro No side effects and  no SI/HI - escitalopram (LEXAPRO) 20 MG tablet; TAKE 1 TABLET(20 MG) BY MOUTH DAILY  Dispense: 90 tablet; Refill: 1   Partially dictated using Editor, commissioning. Any errors are unintentional.  Halina Maidens, MD [facility]  04/24/2022

## 2022-04-25 ENCOUNTER — Telehealth: Payer: Self-pay

## 2022-04-25 ENCOUNTER — Other Ambulatory Visit: Payer: Self-pay | Admitting: Internal Medicine

## 2022-04-25 ENCOUNTER — Telehealth: Payer: Self-pay | Admitting: Internal Medicine

## 2022-04-25 DIAGNOSIS — I2510 Atherosclerotic heart disease of native coronary artery without angina pectoris: Secondary | ICD-10-CM

## 2022-04-25 DIAGNOSIS — E782 Mixed hyperlipidemia: Secondary | ICD-10-CM

## 2022-04-25 DIAGNOSIS — E559 Vitamin D deficiency, unspecified: Secondary | ICD-10-CM

## 2022-04-25 LAB — CBC WITH DIFFERENTIAL/PLATELET
Basophils Absolute: 0.1 10*3/uL (ref 0.0–0.2)
Basos: 2 %
EOS (ABSOLUTE): 0.4 10*3/uL (ref 0.0–0.4)
Eos: 5 %
Hematocrit: 43.5 % (ref 34.0–46.6)
Hemoglobin: 14.6 g/dL (ref 11.1–15.9)
Immature Grans (Abs): 0 10*3/uL (ref 0.0–0.1)
Immature Granulocytes: 0 %
Lymphocytes Absolute: 1.6 10*3/uL (ref 0.7–3.1)
Lymphs: 21 %
MCH: 31.2 pg (ref 26.6–33.0)
MCHC: 33.6 g/dL (ref 31.5–35.7)
MCV: 93 fL (ref 79–97)
Monocytes Absolute: 0.7 10*3/uL (ref 0.1–0.9)
Monocytes: 9 %
Neutrophils Absolute: 5.1 10*3/uL (ref 1.4–7.0)
Neutrophils: 63 %
Platelets: 182 10*3/uL (ref 150–450)
RBC: 4.68 x10E6/uL (ref 3.77–5.28)
RDW: 13.1 % (ref 11.7–15.4)
WBC: 8 10*3/uL (ref 3.4–10.8)

## 2022-04-25 LAB — COMPREHENSIVE METABOLIC PANEL
ALT: 16 IU/L (ref 0–32)
AST: 22 IU/L (ref 0–40)
Albumin/Globulin Ratio: 1.6 (ref 1.2–2.2)
Albumin: 4 g/dL (ref 3.9–4.9)
Alkaline Phosphatase: 69 IU/L (ref 44–121)
BUN/Creatinine Ratio: 10 — ABNORMAL LOW (ref 12–28)
BUN: 10 mg/dL (ref 8–27)
Bilirubin Total: 0.3 mg/dL (ref 0.0–1.2)
CO2: 20 mmol/L (ref 20–29)
Calcium: 8.9 mg/dL (ref 8.7–10.3)
Chloride: 106 mmol/L (ref 96–106)
Creatinine, Ser: 0.96 mg/dL (ref 0.57–1.00)
Globulin, Total: 2.5 g/dL (ref 1.5–4.5)
Glucose: 85 mg/dL (ref 70–99)
Potassium: 4.2 mmol/L (ref 3.5–5.2)
Sodium: 145 mmol/L — ABNORMAL HIGH (ref 134–144)
Total Protein: 6.5 g/dL (ref 6.0–8.5)
eGFR: 65 mL/min/{1.73_m2} (ref >59)

## 2022-04-25 LAB — TSH: TSH: 1.98 u[IU]/mL (ref 0.450–4.500)

## 2022-04-25 LAB — VITAMIN B12: Vitamin B-12: 363 pg/mL (ref 232–1245)

## 2022-04-25 LAB — LIPID PANEL
Chol/HDL Ratio: 3.1 ratio (ref 0.0–4.4)
Cholesterol, Total: 157 mg/dL (ref 100–199)
HDL: 50 mg/dL (ref >39)
LDL Chol Calc (NIH): 87 mg/dL (ref 0–99)
Triglycerides: 111 mg/dL (ref 0–149)
VLDL Cholesterol Cal: 20 mg/dL (ref 5–40)

## 2022-04-25 LAB — VITAMIN D 25 HYDROXY (VIT D DEFICIENCY, FRACTURES): Vit D, 25-Hydroxy: 13.3 ng/mL — ABNORMAL LOW (ref 30.0–100.0)

## 2022-04-25 MED ORDER — REPATHA 140 MG/ML ~~LOC~~ SOSY: 140.0000 mg | PREFILLED_SYRINGE | SUBCUTANEOUS | 3 refills | Status: DC

## 2022-04-25 MED ORDER — VITAMIN D (ERGOCALCIFEROL) 1.25 MG (50000 UNIT) PO CAPS: 50000.0000 [IU] | ORAL_CAPSULE | ORAL | 3 refills | Status: DC

## 2022-04-25 NOTE — Telephone Encounter (Signed)
Copied from [place] [id number]. Topic: General - Other >> Apr 25, 2022  3:41 PM Ja-Kwan M wrote: Reason for CRM: Pt reports that the Rx for Evolocumab (REPATHA) 140 MG/ML SOSY needs a PA.

## 2022-04-25 NOTE — Telephone Encounter (Signed)
Completed PA online, PA approved until  08/31/2022. Sent patient my chart message.  - Iveth Heidemann

## 2022-04-25 NOTE — Telephone Encounter (Signed)
Completed PA on [email or web address] for Repatha injection.   (Key: [id number])  PA was Approved. Patient informed via my chart.

## 2022-04-28 ENCOUNTER — Telehealth: Payer: Self-pay

## 2022-04-28 NOTE — Telephone Encounter (Signed)
Approved until 08/31/2022  Pt informed and verbalized understanding.  KP

## 2022-06-03 ENCOUNTER — Telehealth: Payer: Self-pay

## 2022-06-03 NOTE — Telephone Encounter (Signed)
Left VM and call back number for patient to call for LDCT scheduling

## 2022-06-09 ENCOUNTER — Ambulatory Visit: Payer: Self-pay

## 2022-06-09 NOTE — Telephone Encounter (Signed)
Chief Complaint: cough, Covid + Symptoms: productive cough with yellow phlegm, fever, aches, joint pain, SOB with exertion Frequency: yesterday Pertinent Negatives: Patient denies diarrhea, nausea, vomiting,  Disposition: '[]'$ ED /'[]'$ Urgent Care (no appt availability in office) / '[x]'$ Appointment(In office/virtual)/ '[]'$  [facility]/ '[]'$ Home Care/ '[]'$ Refused Recommended Disposition /'[]'$ [facility]/ '[]'$  Follow-up with PCP Additional Notes: virtual appt  Reason for Disposition  [1] HIGH RISK patient (e.g., weak immune system, age > 22 years, obesity with BMI 30 or higher, pregnant, chronic lung disease or other chronic medical condition) AND [2] COVID symptoms (e.g., cough, fever)  (Exceptions: Already seen by PCP and no new or worsening symptoms.)  Answer Assessment - Initial Assessment Questions 1. COVID-19 DIAGNOSIS: "How do you know that you have COVID?" (e.g., positive lab test or self-test, diagnosed by doctor or NP/PA, symptoms after exposure).     Self test 2. COVID-19 EXPOSURE: "Was there any known exposure to COVID before the symptoms began?" CDC Definition of close contact: within 6 feet (2 meters) for a total of 15 minutes or more over a 24-hour period.      yes 3. ONSET: "When did the COVID-19 symptoms start?"      yesterday 4. WORST SYMPTOM: "What is your worst symptom?" (e.g., cough, fever, shortness of breath, muscle aches)     cough 5. COUGH: "Do you have a cough?" If Yes, ask: "How bad is the cough?"       Yes prod cough yellow phlegm  6. FEVER: "Do you have a fever?" If Yes, ask: "What is your temperature, how was it measured, and when did it start?"     100.1 7. RESPIRATORY STATUS: "Describe your breathing?" (e.g., normal; shortness of breath, wheezing, unable to speak)      Wheezing but inhaler, SOB with exertion 8. BETTER-SAME-WORSE: "Are you getting better, staying the same or getting worse compared to yesterday?"  If getting worse, ask, "In what way?"      worse 9. OTHER SYMPTOMS: "Do you have any other symptoms?"  (e.g., chills, fatigue, headache, loss of smell or taste, muscle pain, sore throat)     Fever 100.1, achy tired 10. HIGH RISK DISEASE: "Do you have any chronic medical problems?" (e.g., asthma, heart or lung disease, weak immune system, obesity, etc.)       COPD 11. VACCINE: "Have you had the COVID-19 vaccine?" If Yes, ask: "Which one, how many shots, when did you get it?"       N/a 12. PREGNANCY: "Is there any chance you are pregnant?" "When was your last menstrual period?"       N/a 13. O2 SATURATION MONITOR:  "Do you use an oxygen saturation monitor (pulse oximeter) at home?" If Yes, ask "What is your reading (oxygen level) today?" "What is your usual oxygen saturation reading?" (e.g., 95%)       N/a  Answer Assessment - Initial Assessment Questions 1. ONSET: "When did the cough begin?"      This am positive 2. SEVERITY: "How bad is the cough today?"      *No Answer* 3. SPUTUM: "Describe the color of your sputum" (none, dry cough; clear, white, yellow, green)     *No Answer* 4. HEMOPTYSIS: "Are you coughing up any blood?" If so ask: "How much?" (flecks, streaks, tablespoons, etc.)     *No Answer* 5. DIFFICULTY BREATHING: "Are you having difficulty breathing?" If Yes, ask: "How bad is it?" (e.g., mild, moderate, severe)    - MILD: No SOB at rest, mild  SOB with walking, speaks normally in sentences, can lie down, no retractions, pulse < 100.    - MODERATE: SOB at rest, SOB with minimal exertion and prefers to sit, cannot lie down flat, speaks in phrases, mild retractions, audible wheezing, pulse 100-120.    - SEVERE: Very SOB at rest, speaks in single words, struggling to breathe, sitting hunched forward, retractions, pulse > 120      *No Answer* 6. FEVER: "Do you have a fever?" If Yes, ask: "What is your temperature, how was it measured, and when did it start?"     *No Answer* 7. CARDIAC HISTORY: "Do you have any history of  heart disease?" (e.g., heart attack, congestive heart failure)      *No Answer* 8. LUNG HISTORY: "Do you have any history of lung disease?"  (e.g., pulmonary embolus, asthma, emphysema)     *No Answer* 9. PE RISK FACTORS: "Do you have a history of blood clots?" (or: recent major surgery, recent prolonged travel, bedridden)     *No Answer* 10. OTHER SYMPTOMS: "Do you have any other symptoms?" (e.g., runny nose, wheezing, chest pain)       *No Answer* 11. PREGNANCY: "Is there any chance you are pregnant?" "When was your last menstrual period?"       *No Answer* 12. TRAVEL: "Have you traveled out of the country in the last month?" (e.g., travel history, exposures)       *No Answer*  Protocols used: Cough - Acute Productive-A-AH, Coronavirus (COVID-19) Diagnosed or Suspected-A-AH

## 2022-06-10 ENCOUNTER — Telehealth (INDEPENDENT_AMBULATORY_CARE_PROVIDER_SITE_OTHER): Payer: Medicare HMO | Admitting: Internal Medicine

## 2022-06-10 ENCOUNTER — Encounter: Payer: Self-pay | Admitting: Internal Medicine

## 2022-06-10 VITALS — HR 70 | Temp 98.6°F

## 2022-06-10 DIAGNOSIS — U071 COVID-19: Secondary | ICD-10-CM

## 2022-06-10 MED ORDER — MOLNUPIRAVIR EUA 200MG CAPSULE: 4.0000 | ORAL_CAPSULE | Freq: Two times a day (BID) | ORAL | 0 refills | Status: AC

## 2022-06-10 MED ORDER — PROMETHAZINE-DM 6.25-15 MG/5ML PO SYRP: 5.0000 mL | ORAL_SOLUTION | Freq: Four times a day (QID) | ORAL | 0 refills | Status: AC | PRN

## 2022-06-10 NOTE — Patient Instructions (Signed)
Take Tylenol 650 - 1000 mg every 6-8 hours for fever, body aches and headache. Drink plenty of fluids with electrolytes. Monitor for fever that does not decrease and/or shortness of breath that worsens or is present at rest. Quarantine for 5 days.  

## 2022-06-10 NOTE — Progress Notes (Signed)
Date:  06/10/2022   Name:  Tiffany Bonilla   DOB:  09/14/1955   MRN:  [id number]  This encounter was conducted via video encounter due to the need for social distancing in light of the Covid-19 pandemic.  The patient was correctly identified.  I advised that I am conducting the visit from a secure room in my office at [facility] clinic.  The patient is located at home. The limitations of this form of encounter were discussed with the patient and he/she agreed to proceed.  Some vital signs will be absent.   Chief Complaint: Covid Positive (Yesterday at work )  Cough This is a new problem. Episode onset: x3 days. The problem has been gradually worsening. The problem occurs hourly. The cough is Productive of sputum. Associated symptoms include chills, a fever (to 100), headaches, nasal congestion, postnasal drip, rhinorrhea, a sore throat, shortness of breath and wheezing. Pertinent negatives include no chest pain. She has tried steroid inhaler for the symptoms. The treatment provided mild relief.    Lab Results  Component Value Date   NA 145 (H) 04/24/2022   K 4.2 04/24/2022   CO2 20 04/24/2022   GLUCOSE 85 04/24/2022   BUN 10 04/24/2022   CREATININE 0.96 04/24/2022   CALCIUM 8.9 04/24/2022   EGFR 65 04/24/2022   GFRNONAA 50 (L) 04/09/2020   Lab Results  Component Value Date   CHOL 157 04/24/2022   HDL 50 04/24/2022   LDLCALC 87 04/24/2022   TRIG 111 04/24/2022   CHOLHDL 3.1 04/24/2022   Lab Results  Component Value Date   TSH 1.980 04/24/2022   Lab Results  Component Value Date   HGBA1C 5.5 02/16/2019   Lab Results  Component Value Date   WBC 8.0 04/24/2022   HGB 14.6 04/24/2022   HCT 43.5 04/24/2022   MCV 93 04/24/2022   PLT 182 04/24/2022   Lab Results  Component Value Date   ALT 16 04/24/2022   AST 22 04/24/2022   ALKPHOS 69 04/24/2022   BILITOT 0.3 04/24/2022   Lab Results  Component Value Date   VD25OH 13.3 (L) 04/24/2022     Review of  Systems  Constitutional:  Positive for chills, fatigue and fever (to 100).  HENT:  Positive for postnasal drip, rhinorrhea and sore throat.   Respiratory:  Positive for cough, shortness of breath and wheezing. Negative for chest tightness.   Cardiovascular:  Negative for chest pain, palpitations and leg swelling.  Neurological:  Positive for headaches.    Patient Active Problem List   Diagnosis Date Noted   Centrilobular emphysema ([place]) 04/24/2022   Colon cancer screening    Polyp of descending colon    Right hip pain 06/19/2021   Chronic midline low back pain without sciatica 06/19/2021   Chronic kidney disease, stage 3b ([place]) 04/19/2021   Diarrhea 04/07/2021   Elevated troponin 04/07/2021   Postural dizziness with presyncope 04/07/2021   Atrophic kidney 09/27/2020   Aortic aneurysm, abdominal ([place]) 09/27/2020   Benign essential tremor 04/09/2020   Vitamin D deficiency 09/06/2019   Other specified anxiety disorders 78/24/2353   Lichen sclerosus et atrophicus of the vulva 11/25/2018   Essential hypertension 11/24/2018   Old lacunar stroke without late effect 11/24/2018   Current every day smoker 10/25/2015   Hx of hepatitis C 10/25/2015   CAD in native artery 05/24/2015   Mixed hyperlipidemia 06/12/2011    Allergies  Allergen Reactions   Morphine Hives and Shortness Of Breath  Stopped breathing    Scopolamine Other (See Comments)    Unconscious    Codeine Nausea Only and Other (See Comments)    NAUSEA/VOMITING    Sulfa Antibiotics Hives and Nausea Only    Other Reaction: Not Assessed     Past Surgical History:  Procedure Laterality Date   CARDIAC ELECTROPHYSIOLOGY STUDY AND ABLATION  2006   for SVT   CATARACT EXTRACTION W/PHACO Right 02/06/2021   Procedure: CATARACT EXTRACTION PHACO AND INTRAOCULAR LENS PLACEMENT ([place]) RIGHT;  Surgeon: Leandrew Koyanagi, MD;  Location: [place];  Service: Ophthalmology;  Laterality: Right;  2.85 0:32.6 8.7%    CATARACT EXTRACTION W/PHACO Left 02/20/2021   Procedure: CATARACT EXTRACTION PHACO AND INTRAOCULAR LENS PLACEMENT ([place]) LEFT;  Surgeon: Leandrew Koyanagi, MD;  Location: [place];  Service: Ophthalmology;  Laterality: Left;  3.12 0:39.5   COLONOSCOPY  2006   COLONOSCOPY WITH PROPOFOL N/A 10/25/2021   Procedure: COLONOSCOPY WITH BIOPSY;  Surgeon: [name], MD;  Location: [place];  Service: Endoscopy;  Laterality: N/A;   CORONARY ANGIOPLASTY WITH STENT PLACEMENT  2001   to Circumfx   ESOPHAGOGASTRODUODENOSCOPY  2017   LAPAROSCOPIC CHOLECYSTECTOMY  04/25/2019   @ [place] RESECTION  11/07/2015   POLYPECTOMY N/A 10/25/2021   Procedure: POLYPECTOMY;  Surgeon: [name], MD;  Location: [place];  Service: Endoscopy;  Laterality: N/A;   TOTAL ABDOMINAL HYSTERECTOMY  1992   CIS cervix    Social History   Tobacco Use   Smoking status: Some Days    Packs/day: 0.50    Years: 40.00    Total pack years: 20.00    Types: Cigarettes    Start date: 69   Smokeless tobacco: Never  Vaping Use   Vaping Use: Never used  Substance Use Topics   Alcohol use: Not Currently    Comment: rare   Drug use: Never     Medication list has been reviewed and updated.  Current Meds  Medication Sig   albuterol (VENTOLIN HFA) 108 (90 Base) MCG/ACT inhaler Inhale 2 puffs into the lungs every 6 (six) hours as needed for wheezing or shortness of breath.   aspirin EC 81 MG tablet Take 1 tablet by mouth daily.   Black Elderberry 50 MG/5ML SYRP Take by mouth.   cholecalciferol (VITAMIN D3) 25 MCG (1000 UNIT) tablet Take 2,000 Units by mouth daily.   clobetasol cream (TEMOVATE) 0.05 % APPLY TOPICALLY TO THE AFFECTED AREA TWICE DAILY   escitalopram (LEXAPRO) 20 MG tablet TAKE 1 TABLET(20 MG) BY MOUTH DAILY   Evolocumab (REPATHA) 140 MG/ML SOSY Inject 140 mg into the skin every 14 (fourteen) days.   gabapentin (NEURONTIN) 100 MG capsule Take 2  capsules (200 mg total) by mouth 2 (two) times daily.   lisinopril (ZESTRIL) 10 MG tablet Take 1 tablet (10 mg total) by mouth daily.   Melatonin 10 MG TABS Take 1 tablet by mouth at bedtime.   molnupiravir EUA (LAGEVRIO) 200 mg CAPS capsule Take 4 capsules (800 mg total) by mouth 2 (two) times daily for 5 days.   Multiple Vitamins-Minerals (ALIVE WOMENS ENERGY) TABS Take by mouth.   promethazine-dextromethorphan (PROMETHAZINE-DM) 6.25-15 MG/5ML syrup Take 5 mLs by mouth 4 (four) times daily as needed for up to 9 days for cough.   rosuvastatin (CRESTOR) 40 MG tablet TAKE 1 TABLET(40 MG) BY MOUTH DAILY   Vitamin D, Ergocalciferol, (DRISDOL) 1.25 MG (50000 UNIT) CAPS capsule Take 1 capsule (50,000 Units total) by  mouth every 7 (seven) days.       06/10/2022   10:01 AM 04/24/2022    8:32 AM 06/19/2021    1:42 PM 04/18/2021    9:54 AM  GAD 7 : Generalized Anxiety Score  Nervous, Anxious, on Edge 0 0 0 0  Control/stop worrying 0 0 0 0  Worry too much - different things 0 0 0 1  Trouble relaxing 0 0 0 0  Restless 0 0 0 0  Easily annoyed or irritable 0 0 0 0  Afraid - awful might happen 0 0 0 0  Total GAD 7 Score 0 0 0 1  Anxiety Difficulty Not difficult at all Not difficult at all Not difficult at all        06/10/2022   10:00 AM 04/24/2022    8:32 AM 08/21/2021    2:13 PM  Depression screen PHQ 2/9  Decreased Interest 0 0 0  Down, Depressed, Hopeless 0 0 0  PHQ - 2 Score 0 0 0  Altered sleeping 1 0   Tired, decreased energy 2 0   Change in appetite 1 0   Feeling bad or failure about yourself  0 0   Trouble concentrating 0 0   Moving slowly or fidgety/restless 0 0   Suicidal thoughts 0 0   PHQ-9 Score 4 0   Difficult doing work/chores Not difficult at all Not difficult at all     BP Readings from Last 3 Encounters:  04/24/22 122/78  01/10/22 (!) 218/87  10/25/21 (!) 145/69    Physical Exam Constitutional:      Appearance: Normal appearance. She is ill-appearing.   Pulmonary:     Effort: Pulmonary effort is normal.     Comments: No cough or dyspnea noted during the call Neurological:     General: No focal deficit present.     Mental Status: She is alert.  Psychiatric:        Attention and Perception: Attention normal.        Mood and Affect: Mood normal.        Speech: Speech normal.        Behavior: Behavior normal.        Cognition and Memory: Cognition normal.     Wt Readings from Last 3 Encounters:  04/24/22 138 lb (62.6 kg)  01/10/22 130 lb (59 kg)  10/25/21 128 lb 14.4 oz (58.5 kg)    Pulse 70   Temp 98.6 F (37 C) (Oral)   SpO2 96%   Assessment and Plan: 1. COVID-19 virus infection Take Tylenol 650 - 1000 mg every 6-8 hours for fever, body aches and headache. Continue Albuterol MDI as needed Drink plenty of fluids with electrolytes. Monitor for fever that does not decrease and/or shortness of breath that worsens or is present at rest. Quarantine for 5 days. - molnupiravir EUA (LAGEVRIO) 200 mg CAPS capsule; Take 4 capsules (800 mg total) by mouth 2 (two) times daily for 5 days.  Dispense: 40 capsule; Refill: 0 - promethazine-dextromethorphan (PROMETHAZINE-DM) 6.25-15 MG/5ML syrup; Take 5 mLs by mouth 4 (four) times daily as needed for up to 9 days for cough.  Dispense: 118 mL; Refill: 0  I spent 7 minutes on this encounter, 100% via video. Partially dictated using Editor, commissioning. Any errors are unintentional.  Halina Maidens, MD [facility]  06/10/2022

## 2022-06-19 ENCOUNTER — Other Ambulatory Visit: Payer: Self-pay | Admitting: Internal Medicine

## 2022-06-19 DIAGNOSIS — I10 Essential (primary) hypertension: Secondary | ICD-10-CM

## 2022-06-19 NOTE — Telephone Encounter (Signed)
Requested medication (s) are due for refill today -expired Rx  Requested medication (s) are on the active medication list -yes  Future visit scheduled -yes  Last refill: 05/04/22 #90 1RF  Notes to clinic: expired Rx  Requested Prescriptions  Pending Prescriptions Disp Refills   lisinopril (ZESTRIL) 10 MG tablet [Pharmacy Med Name: LISINOPRIL '10MG'$  TABLETS] 90 tablet 1    Sig: TAKE 1 TABLET(10 MG) BY MOUTH DAILY     Cardiovascular:  ACE Inhibitors Passed - 06/19/2022  2:23 PM      Passed - Cr in normal range and within 180 days    Creatinine, Ser  Date Value Ref Range Status  04/24/2022 0.96 0.57 - 1.00 mg/dL Final         Passed - K in normal range and within 180 days    Potassium  Date Value Ref Range Status  04/24/2022 4.2 3.5 - 5.2 mmol/L Final         Passed - Patient is not pregnant      Passed - Last BP in normal range    BP Readings from Last 1 Encounters:  04/24/22 122/78         Passed - Valid encounter within last 6 months    Recent Outpatient Visits           1 week ago COVID-19 virus infection   [place] [facility] at [facility], Jesse Sans, MD   1 month ago Annual physical exam   [place] [facility] at [facility], Jesse Sans, MD   1 year ago Right hip pain   [place] [facility] at [facility], Jesse Sans, MD   1 year ago Annual physical exam   [facility] at [facility], Jesse Sans, MD   1 year ago Essential hypertension   [place] [facility] at [place], Earley Abide, MD       Future Appointments             In 4 months Army Melia Jesse Sans, MD [facility] at [facility], [facility]   In 10 months Glean Hess, MD [facility] Medicine at [facility], [facility]               Requested Prescriptions   Pending Prescriptions Disp Refills   lisinopril (ZESTRIL) 10 MG tablet [Pharmacy Med Name: LISINOPRIL '10MG'$  TABLETS] 90 tablet 1    Sig: TAKE 1 TABLET(10 MG) BY MOUTH DAILY     Cardiovascular:  ACE Inhibitors Passed - 06/19/2022  2:23 PM      Passed - Cr in normal range and within 180 days    Creatinine, Ser  Date Value Ref Range Status  04/24/2022 0.96 0.57 - 1.00 mg/dL Final         Passed - K in normal range and within 180 days    Potassium  Date Value Ref Range Status  04/24/2022 4.2 3.5 - 5.2 mmol/L Final         Passed - Patient is not pregnant      Passed - Last BP in normal range    BP Readings from Last 1 Encounters:  04/24/22 122/78         Passed - Valid encounter within last 6 months    Recent Outpatient Visits  1 week ago COVID-19 virus infection   [place] [facility] at [facility], Jesse Sans, MD   1 month ago Annual physical exam   [place] [facility] at [facility], Jesse Sans, MD   1 year ago Right hip pain   [place] [facility] at [facility], Jesse Sans, MD   1 year ago Annual physical exam   [facility] at [facility], Jesse Sans, MD   1 year ago Essential hypertension   [place] [facility] at [facility], Earley Abide, MD       Future Appointments             In 4 months Army Melia, Jesse Sans, MD [facility] Primary [facility] and Sports Medicine at [facility], [facility]   In 10 months Army Melia, Jesse Sans, MD [facility] Primary Care and Sports Medicine at [facility], [facility]              c

## 2022-06-25 ENCOUNTER — Ambulatory Visit
Admission: RE | Admit: 2022-06-25 | Discharge: 2022-06-25 | Disposition: A | Discharge status: Home / Self Care | Payer: Medicare HMO | Source: Ambulatory Visit | Attending: Internal Medicine | Admitting: Internal Medicine

## 2022-06-25 DIAGNOSIS — Z1231 Encounter for screening mammogram for malignant neoplasm of breast: Secondary | ICD-10-CM | POA: Insufficient documentation

## 2022-07-07 DIAGNOSIS — U071 COVID-19: Secondary | ICD-10-CM

## 2022-07-07 HISTORY — DX: COVID-19: U07.1

## 2022-07-11 IMAGING — CR DG LUMBAR SPINE COMPLETE 4+V
5 series · 5 of 5 positions shown · non-contrast
Comparison: None.

CLINICAL DATA: Low back and right leg pain, initial encounter

EXAM:
LUMBAR SPINE - COMPLETE 4+ VIEW

[l-spine ap]
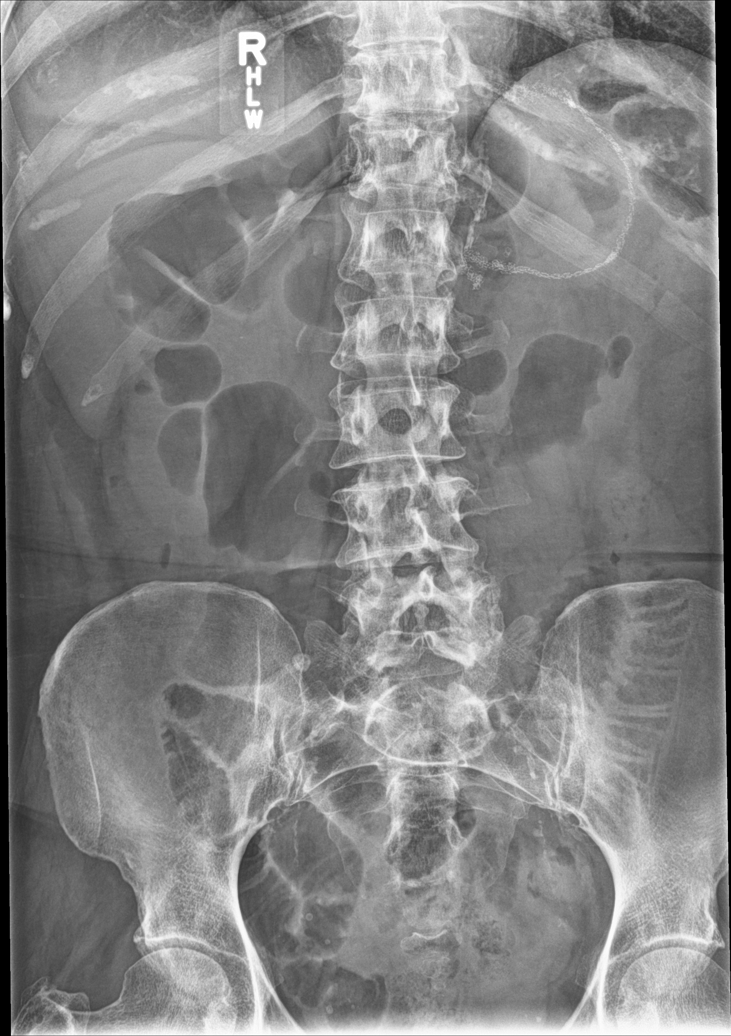

[l-spine obl (1 of 2)]
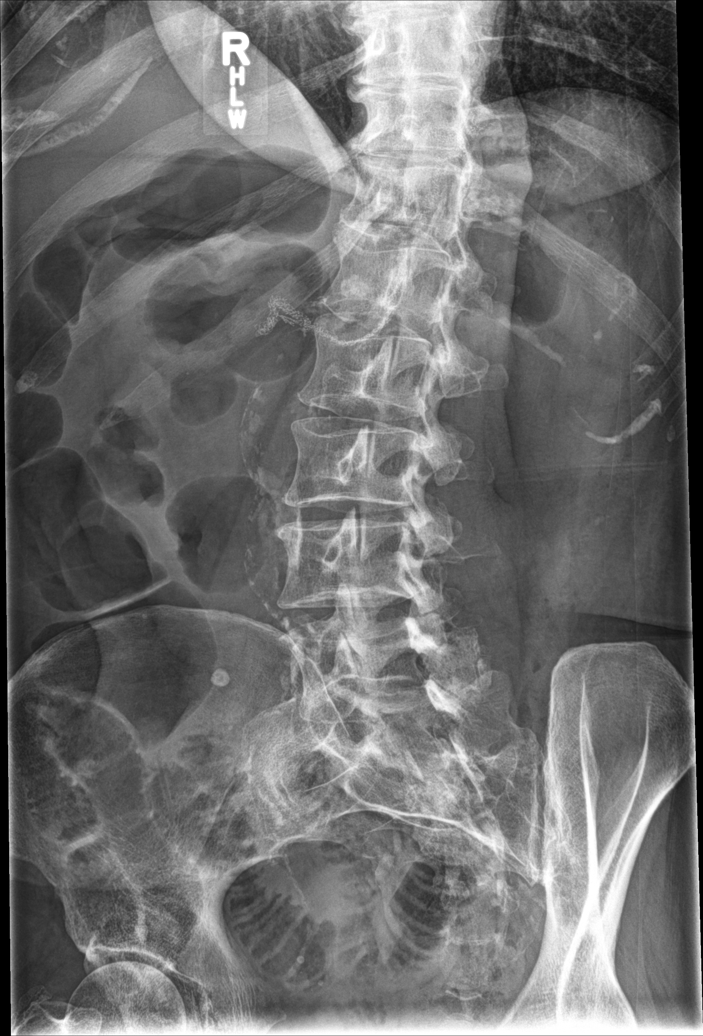

[l-spine obl (2 of 2)]
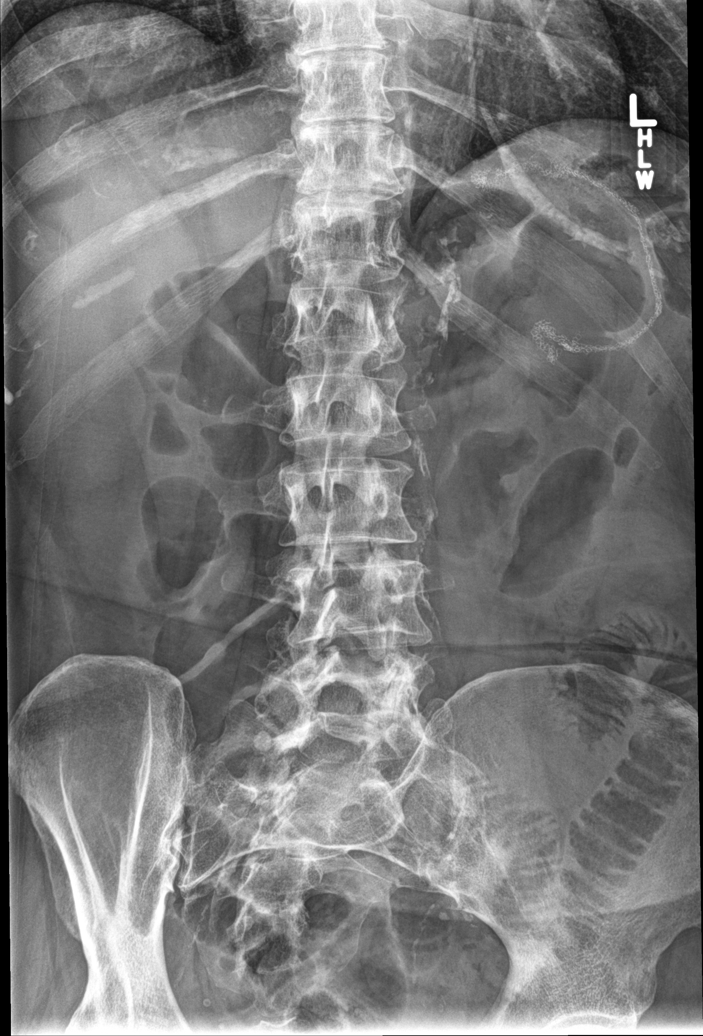

[l-spine lat]
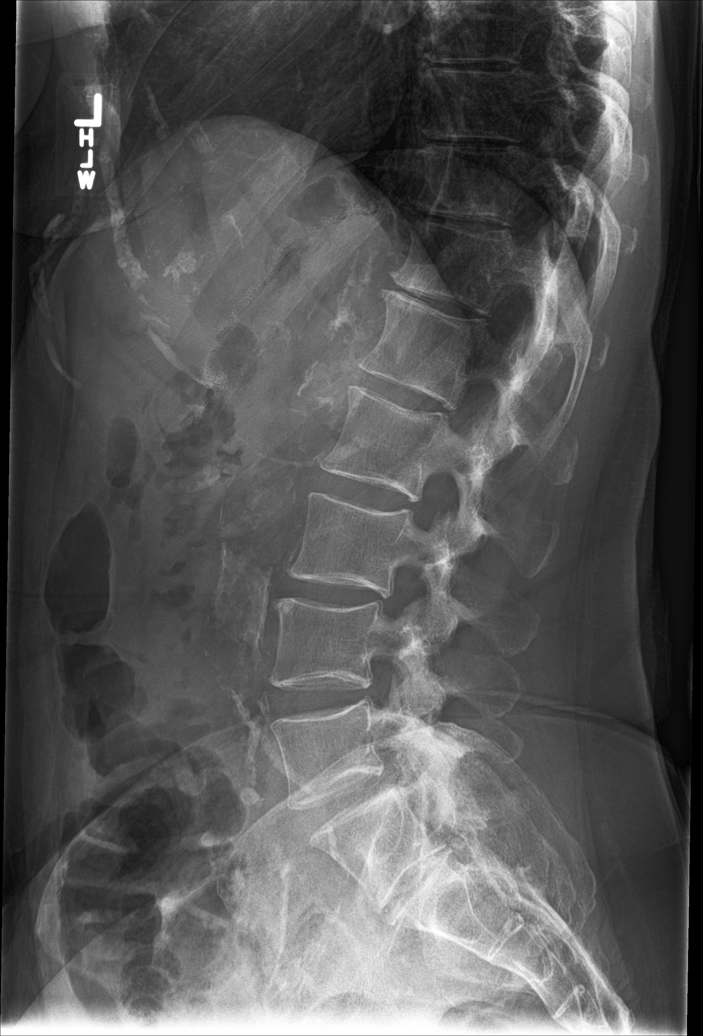

[l-spine spot]
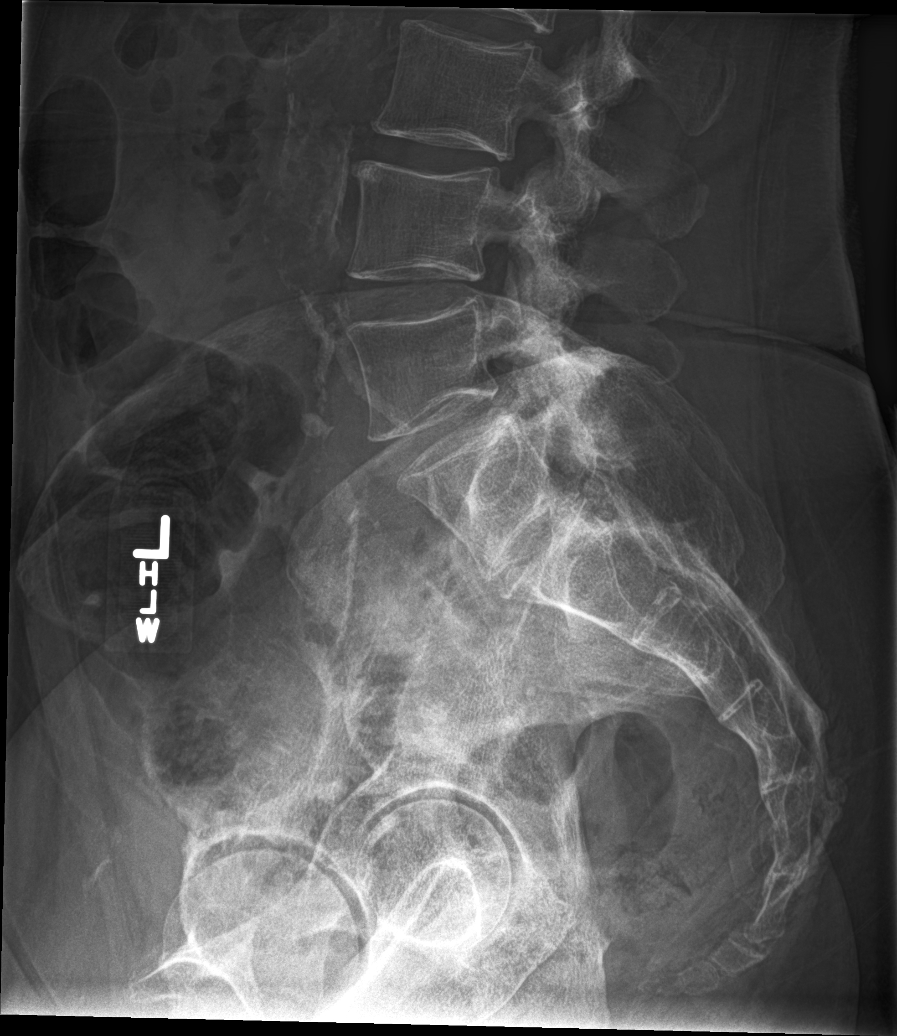

[5 of 5 positions shown; findings below may reference images not displayed]

FINDINGS: Five lumbar type vertebral bodies are well visualized. No pars
defects are seen. No anterolisthesis is noted. Vertebral body height
is well maintained. Transitional segment at the lumbosacral junction
is noted. Vascular calcifications are seen. Postsurgical changes in
the stomach are noted. No soft tissue abnormality is seen.
IMPRESSION: No acute abnormality noted.

## 2022-07-11 IMAGING — CR DG HIP (WITH OR WITHOUT PELVIS) 2-3V*R*
3 series · 3 of 3 positions shown · non-contrast
Comparison: None.

CLINICAL DATA: Right-sided hip pain following fall several months
ago, initial encounter

EXAM:
DG HIP (WITH OR WITHOUT PELVIS) 3V RIGHT

[pelvis ap]
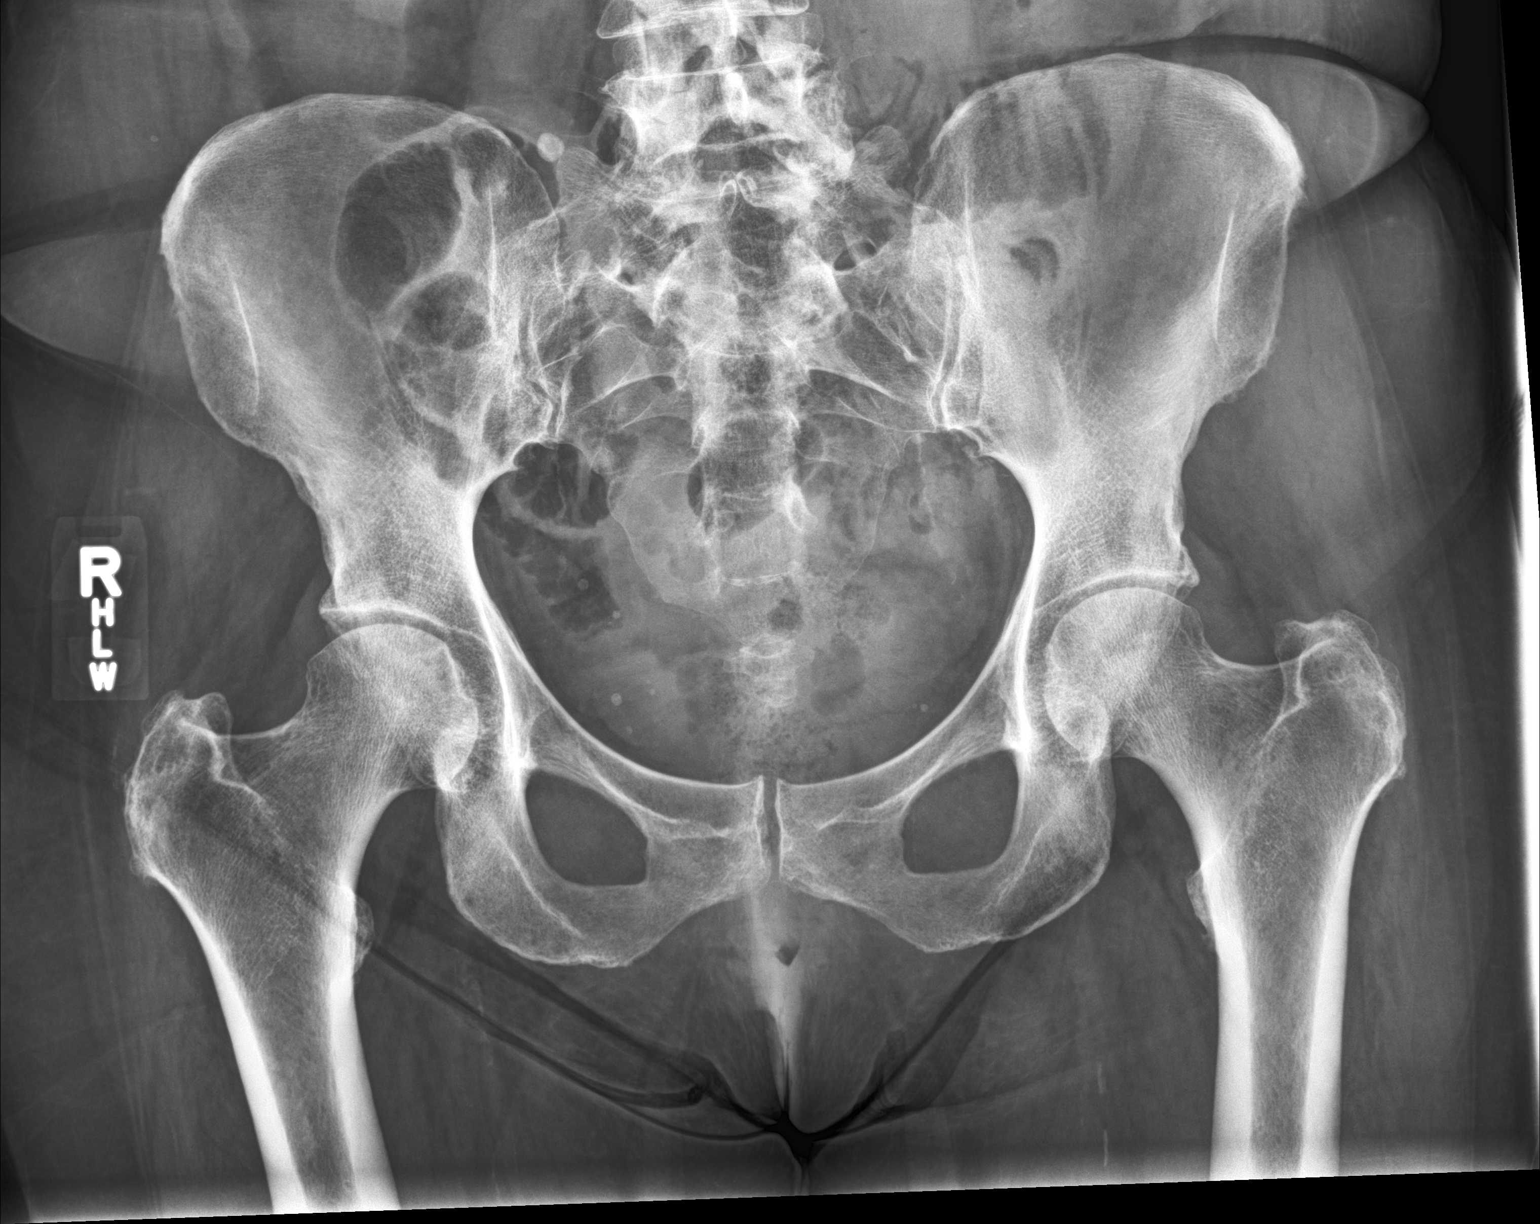

[hip lat (1 of 2)]
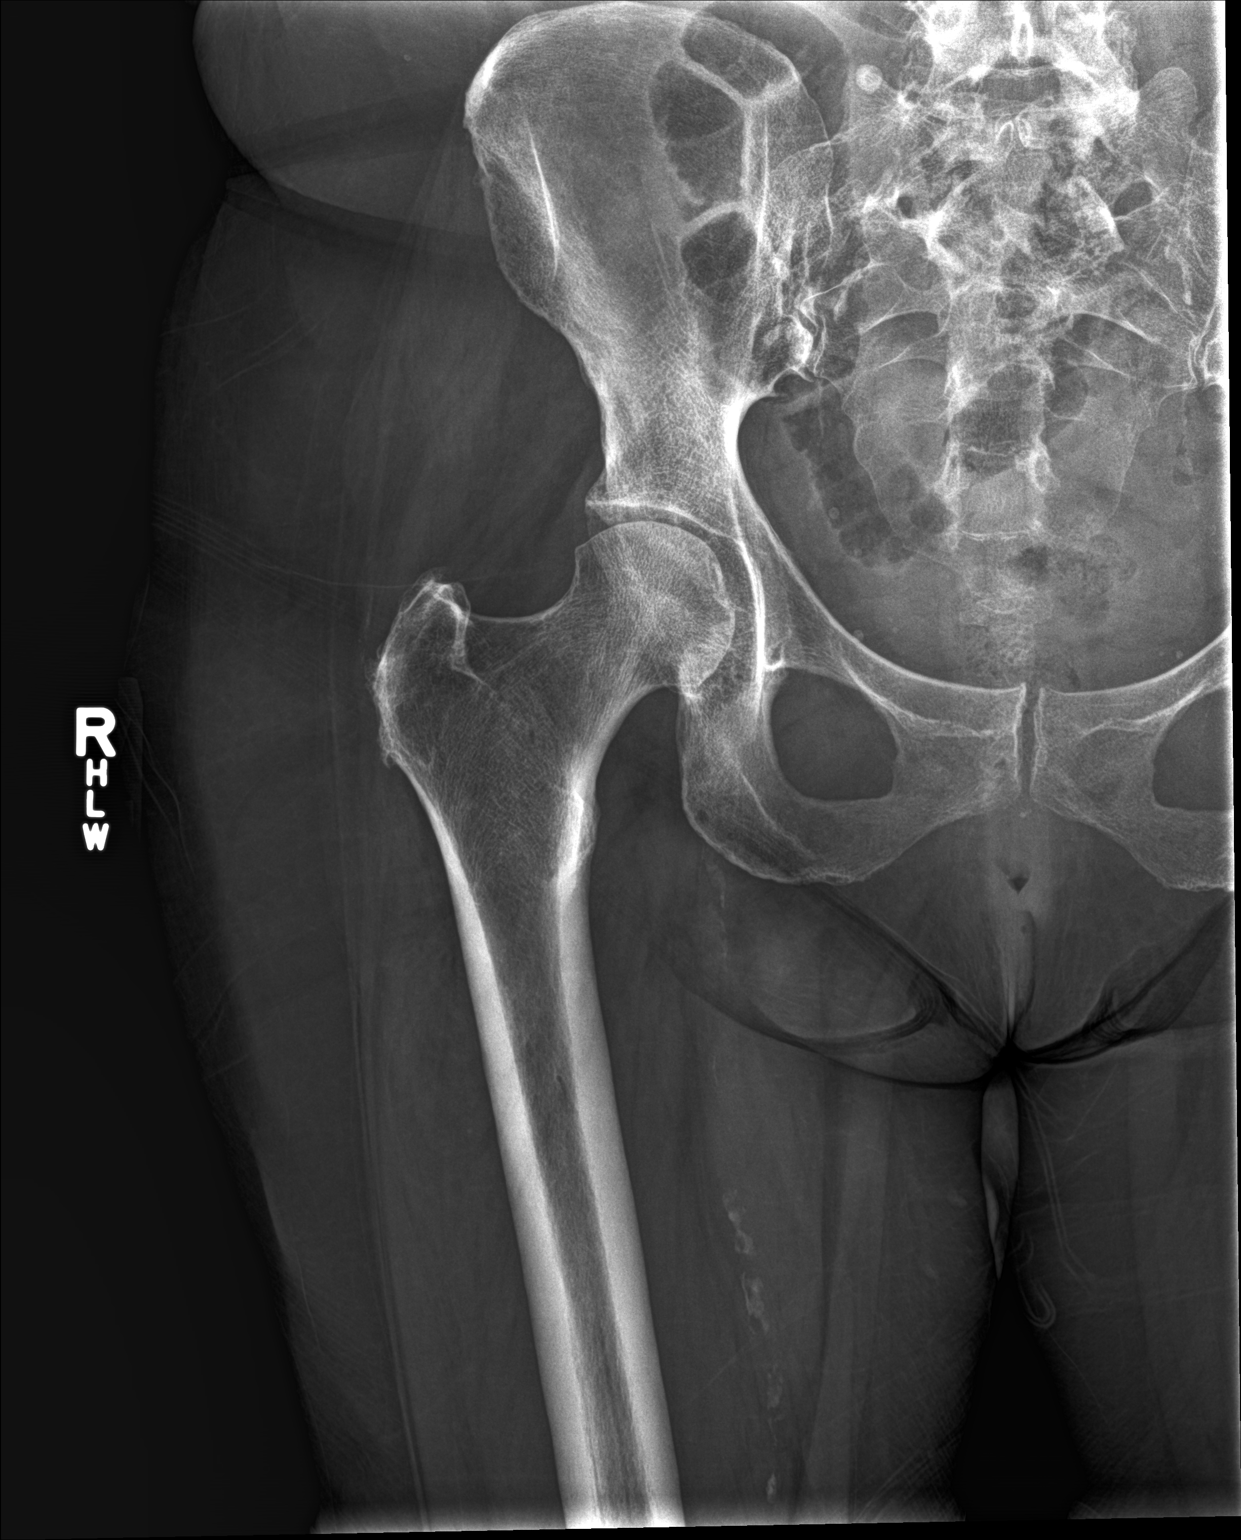

[hip lat (2 of 2)]
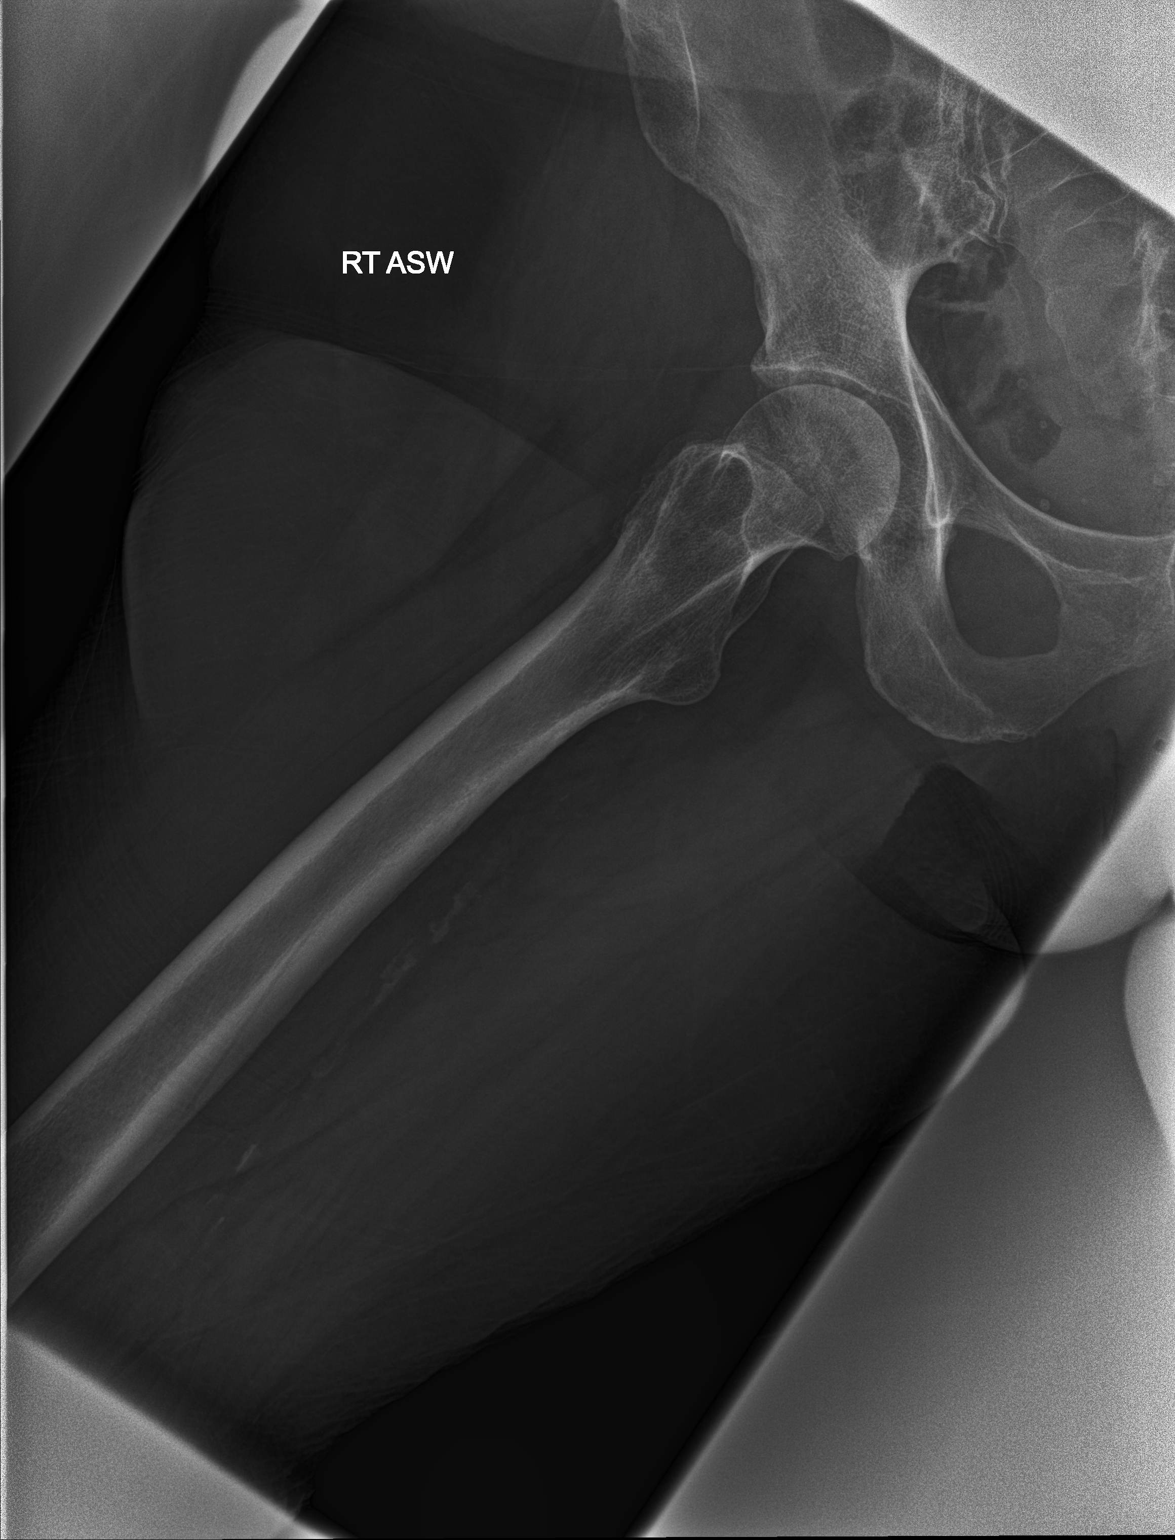

[3 of 3 positions shown; findings below may reference images not displayed]

FINDINGS: Pelvic ring is intact. Degenerative changes of the hip joints are
seen. No acute fracture or dislocation is noted. Vascular
calcifications are noted.
IMPRESSION: No acute abnormality noted.

## 2022-08-06 ENCOUNTER — Ambulatory Visit
Admission: EM | Admit: 2022-08-06 | Discharge: 2022-08-06 | Disposition: A | Discharge status: Home / Self Care | Payer: Medicare HMO | Attending: Emergency Medicine | Admitting: Emergency Medicine

## 2022-08-06 ENCOUNTER — Other Ambulatory Visit: Payer: Self-pay | Admitting: Internal Medicine

## 2022-08-06 DIAGNOSIS — N904 Leukoplakia of vulva: Secondary | ICD-10-CM

## 2022-08-06 DIAGNOSIS — J441 Chronic obstructive pulmonary disease with (acute) exacerbation: Secondary | ICD-10-CM | POA: Diagnosis not present

## 2022-08-06 DIAGNOSIS — E782 Mixed hyperlipidemia: Secondary | ICD-10-CM

## 2022-08-06 MED ORDER — PREDNISONE 20 MG PO TABS: 40.0000 mg | ORAL_TABLET | Freq: Every day | ORAL | 0 refills | Status: DC

## 2022-08-06 MED ORDER — PROMETHAZINE-DM 6.25-15 MG/5ML PO SYRP: 5.0000 mL | ORAL_SOLUTION | Freq: Four times a day (QID) | ORAL | 0 refills | Status: DC | PRN

## 2022-08-06 MED ORDER — BENZONATATE 100 MG PO CAPS: 100.0000 mg | ORAL_CAPSULE | Freq: Three times a day (TID) | ORAL | 0 refills | Status: DC

## 2022-08-06 MED ORDER — AMOXICILLIN-POT CLAVULANATE 875-125 MG PO TABS: 1.0000 | ORAL_TABLET | Freq: Two times a day (BID) | ORAL | 0 refills | Status: DC

## 2022-08-06 NOTE — ED Triage Notes (Addendum)
Chief Complaint: pt c/o wet & dry cough, congestion, SOB and muscle aches since thanksgiving. Has done breathing treatments and otc meds w.o relief.   Onset: Ongoing since thanksgiving.   Prescriptions or OTC medications tried: Yes- breathing treatments & inhalers.     with no relief  Sick exposure: Yes- 2 of her grandchildren had RSV   New foods, medications, or products: No  Recent Travel: No

## 2022-08-06 NOTE — Discharge Instructions (Signed)
Initially symptoms most likely were caused by a virus which have since fully her COPD  Take Augmentin every morning and every evening for 7 days  Begin prednisone every morning for 5 days to reduce inflammation to the airway  You may use Tessalon pill every 8 hours to help calm your coughing  You may use cough syrup for additional comfort, be mindful this medicine will make you drowsy  You may continue use of inhalers and nebulizers as prescribed by your doctor  You may follow-up with his urgent care as needed if symptoms persist or worsen  Maintaining adequate hydration may help to thin secretions and soothe the respiratory mucosa   Warm Liquids- Ingestion of warm liquids may have a soothing effect on the respiratory mucosa, increase the flow of nasal mucus, and loosen respiratory secretions, making them easier to remove  May try honey (2.5 to 5 mL [0.5 to 1 teaspoon]) can be given straight or diluted in liquid (juice). Corn syrup may be substituted if honey is not available.

## 2022-08-06 NOTE — ED Provider Notes (Signed)
[facility]    CSN: [id number] Arrival date & time: 08/06/22  1357      History   Chief Complaint Chief Complaint  Patient presents with   URI    Entered by patient    HPI Tiffany Bonilla is a 66 y.o. female.   Presents with intermittent fevers, productive cough with clear mucus, wheezing shortness of breath for 2 weeks.  Known exposure to RSV 1 week ago.  Has attempted use of Tylenol, ibuprofen, increase fluids.  History of COPD and asthma nasal congestion, ear pain, chest pain or tightness.   Past Medical History:  Diagnosis Date   Allergy    Asthma    COVID 07/07/2022   Hyperlipidemia    Hypertension    Pre-diabetes 11/24/2018   Spina bifida occulta    Stroke [facility])    SVT (supraventricular tachycardia) 11/24/2018   S/p ablation at [facility]   Wears dentures    full upper    Patient Active Problem List   Diagnosis Date Noted   Centrilobular emphysema ([place]) 04/24/2022   Colon cancer screening    Polyp of descending colon    Right hip pain 06/19/2021   Chronic midline low back pain without sciatica 06/19/2021   Chronic kidney disease, stage 3b ([place]) 04/19/2021   Diarrhea 04/07/2021   Elevated troponin 04/07/2021   Postural dizziness with presyncope 04/07/2021   Atrophic kidney 09/27/2020   Aortic aneurysm, abdominal ([place]) 09/27/2020   Benign essential tremor 04/09/2020   Vitamin D deficiency 09/06/2019   Other specified anxiety disorders 46/65/9935   Lichen sclerosus et atrophicus of the vulva 11/25/2018   Essential hypertension 11/24/2018   Old lacunar stroke without late effect 11/24/2018   Current every day smoker 10/25/2015   Hx of hepatitis C 10/25/2015   CAD in native artery 05/24/2015   Mixed hyperlipidemia 06/12/2011    Past Surgical History:  Procedure Laterality Date   CARDIAC ELECTROPHYSIOLOGY STUDY AND ABLATION  2006   for SVT   CATARACT EXTRACTION W/PHACO Right 02/06/2021   Procedure: CATARACT EXTRACTION PHACO AND INTRAOCULAR  LENS PLACEMENT ([place]) RIGHT;  Surgeon: Leandrew Koyanagi, MD;  Location: [place];  Service: Ophthalmology;  Laterality: Right;  2.85 0:32.6 8.7%   CATARACT EXTRACTION W/PHACO Left 02/20/2021   Procedure: CATARACT EXTRACTION PHACO AND INTRAOCULAR LENS PLACEMENT ([place]) LEFT;  Surgeon: Leandrew Koyanagi, MD;  Location: [place];  Service: Ophthalmology;  Laterality: Left;  3.12 0:39.5   COLONOSCOPY  2006   COLONOSCOPY WITH PROPOFOL N/A 10/25/2021   Procedure: COLONOSCOPY WITH BIOPSY;  Surgeon: [name], MD;  Location: [place];  Service: Endoscopy;  Laterality: N/A;   CORONARY ANGIOPLASTY WITH STENT PLACEMENT  2001   to Circumfx   ESOPHAGOGASTRODUODENOSCOPY  2017   LAPAROSCOPIC CHOLECYSTECTOMY  04/25/2019   @ [place] RESECTION  11/07/2015   POLYPECTOMY N/A 10/25/2021   Procedure: POLYPECTOMY;  Surgeon: [name], MD;  Location: [place];  Service: Endoscopy;  Laterality: N/A;   TOTAL ABDOMINAL HYSTERECTOMY  1992   CIS cervix    OB History   No obstetric history on file.      Home Medications    Prior to Admission medications   Medication Sig Start Date End Date Taking? Authorizing Provider  albuterol (VENTOLIN HFA) 108 (90 Base) MCG/ACT inhaler Inhale 2 puffs into the lungs every 6 (six) hours as needed for wheezing or shortness of breath. 04/24/22  Yes Glean Hess, MD  aspirin EC 81  MG tablet Take 1 tablet by mouth daily.   Yes [provider]  Black Elderberry 50 MG/5ML SYRP Take by mouth.   Yes [provider]  cholecalciferol (VITAMIN D3) 25 MCG (1000 UNIT) tablet Take 2,000 Units by mouth daily.   Yes [provider]  clobetasol cream (TEMOVATE) 0.05 % APPLY TOPICALLY TO THE AFFECTED AREA TWICE DAILY 08/09/21  Yes Glean Hess, MD  escitalopram (LEXAPRO) 20 MG tablet TAKE 1 TABLET(20 MG) BY MOUTH DAILY 04/24/22  Yes Glean Hess, MD  Evolocumab (REPATHA) 140  MG/ML SOSY Inject 140 mg into the skin every 14 (fourteen) days. 04/25/22  Yes Glean Hess, MD  gabapentin (NEURONTIN) 100 MG capsule Take 2 capsules (200 mg total) by mouth 2 (two) times daily. 04/24/22  Yes Glean Hess, MD  lisinopril (ZESTRIL) 10 MG tablet TAKE 1 TABLET(10 MG) BY MOUTH DAILY 06/20/22  Yes Glean Hess, MD  Melatonin 10 MG TABS Take 1 tablet by mouth at bedtime.   Yes [provider]  Multiple Vitamins-Minerals (ALIVE WOMENS ENERGY) TABS Take by mouth.   Yes [provider]  rosuvastatin (CRESTOR) 40 MG tablet TAKE 1 TABLET(40 MG) BY MOUTH DAILY 04/16/22  Yes Glean Hess, MD  Vitamin D, Ergocalciferol, (DRISDOL) 1.25 MG (50000 UNIT) CAPS capsule Take 1 capsule (50,000 Units total) by mouth every 7 (seven) days. 04/25/22  Yes Glean Hess, MD    Family History Family History  Problem Relation Age of Onset   Aneurysm Mother    Breast cancer Maternal Aunt     Social History Social History   Tobacco Use   Smoking status: Some Days    Packs/day: 0.50    Years: 40.00    Total pack years: 20.00    Types: Cigarettes    Start date: 1980   Smokeless tobacco: Never  Vaping Use   Vaping Use: Never used  Substance Use Topics   Alcohol use: Not Currently    Comment: rare   Drug use: Never     Allergies   Morphine, Scopolamine, Codeine, and Sulfa antibiotics   Review of Systems Review of Systems  Constitutional:  Positive for fever. Negative for activity change, appetite change, chills, diaphoresis, fatigue and unexpected weight change.  HENT: Negative.    Respiratory:  Positive for cough, shortness of breath and wheezing. Negative for apnea, choking, chest tightness and stridor.   Cardiovascular: Negative.   Gastrointestinal: Negative.      Physical Exam Triage Vital Signs ED Triage Vitals  Enc Vitals Group     BP 08/06/22 1547 (!) 180/82     Pulse Rate 08/06/22 1547 (!) 55     Resp 08/06/22 1547 16     Temp  08/06/22 1547 98.2 F (36.8 C)     Temp Source 08/06/22 1547 Oral     SpO2 08/06/22 1547 95 %     Weight --      Height --      Head Circumference --      Peak Flow --      Pain Score 08/06/22 1544 0     Pain Loc --      Pain Edu? --      Excl. in [place]? --    No data found.  Updated Vital Signs BP (!) 180/82 (BP Location: Left Arm)   Pulse (!) 55   Temp 98.2 F (36.8 C) (Oral)   Resp 16   SpO2 95%   Visual Acuity Right  Eye Distance:   Left Eye Distance:   Bilateral Distance:    Right Eye Near:   Left Eye Near:    Bilateral Near:     Physical Exam Constitutional:      Appearance: Normal appearance.  HENT:     Right Ear: Tympanic membrane, ear canal and external ear normal.     Left Ear: Tympanic membrane, ear canal and external ear normal.     Nose: Nose normal.     Mouth/Throat:     Mouth: Mucous membranes are moist.     Pharynx: Oropharynx is clear.  Eyes:     Extraocular Movements: Extraocular movements intact.  Cardiovascular:     Rate and Rhythm: Normal rate and regular rhythm.     Pulses: Normal pulses.     Heart sounds: Normal heart sounds.  Pulmonary:     Effort: Pulmonary effort is normal.     Breath sounds: Examination of the right-upper field reveals wheezing. Examination of the left-upper field reveals wheezing. Wheezing present.  Skin:    General: Skin is warm and dry.  Neurological:     Mental Status: She is alert and oriented to person, place, and time. Mental status is at baseline.  Psychiatric:        Mood and Affect: Mood normal.        Behavior: Behavior normal.      UC Treatments / Results  Labs (all labs ordered are listed, but only abnormal results are displayed) Labs Reviewed - No data to display  EKG   Radiology No results found.  Procedures Procedures (including critical care time)  Medications Ordered in UC Medications - No data to display  Initial Impression / Assessment and Plan / UC Course  I have reviewed the  triage vital signs and the nursing notes.  Pertinent labs & imaging results that were available during my care of the patient were reviewed by me and considered in my medical decision making (see chart for details).  COPD exacerbation  Vitals are stable and patient is in no signs, will defer viral testing due to timeline, will most likely started off as a virus.  At this time is flare of COPD supportive treatments as needed, endorses that she has enough of inhaler medication at home to continue usage, may follow-up with his urgent care or primary doctor if symptoms continue to persist or worsen Final Clinical Impressions(s) / UC Diagnoses   Final diagnoses:  None   Discharge Instructions   None    ED Prescriptions   None    PDMP not reviewed this encounter.   Hans Eden, [place] 08/06/22 [phone]

## 2022-08-07 NOTE — Telephone Encounter (Signed)
Requested medication (s) are due for refill today: Yes  Requested medication (s) are on the active medication list: Yes  Last refill:  08/09/21  Future visit scheduled: Yes  Notes to clinic:  See request. Not delegated.    Requested Prescriptions  Pending Prescriptions Disp Refills   clobetasol cream (TEMOVATE) 0.05 % [Pharmacy Med Name: CLOBETASOL PROP 0.05% CREAM 60GM] 60 g 1    Sig: APPLY TOPICALLY TO THE AFFECTED AREA TWICE DAILY     Not Delegated - Dermatology:  Corticosteroids Failed - 08/06/2022  9:29 PM      Failed - This refill cannot be delegated      Passed - Valid encounter within last 12 months    Recent Outpatient Visits           1 month ago COVID-19 virus infection   [place] [facility] at [facility], Jesse Sans, MD   3 months ago Annual physical exam   [place] [facility] at [facility], Jesse Sans, MD   1 year ago Right hip pain   [place] [facility] at [facility], Jesse Sans, MD   1 year ago Annual physical exam   [facility] at [facility], Jesse Sans, MD   1 year ago Essential hypertension   [place] [facility] at [place], Earley Abide, MD       Future Appointments             In 2 months Glean Hess, MD [facility] at [facility], [facility]   In 8 months Glean Hess, MD [facility] at [facility], [facility]            Signed Prescriptions Disp Refills   rosuvastatin (CRESTOR) 40 MG tablet 90 tablet 0    Sig: TAKE 1 TABLET(40 MG) BY MOUTH DAILY     Cardiovascular:  Antilipid - Statins 2 Failed - 08/06/2022  9:29 PM      Failed - Lipid Panel in normal range within the last 12 months    Cholesterol, Total  Date Value Ref Range Status  04/24/2022 157 100 - 199 mg/dL Final   LDL  Chol Calc (NIH)  Date Value Ref Range Status  04/24/2022 87 0 - 99 mg/dL Final   HDL  Date Value Ref Range Status  04/24/2022 50 >39 mg/dL Final   Triglycerides  Date Value Ref Range Status  04/24/2022 111 0 - 149 mg/dL Final         Passed - Cr in normal range and within 360 days    Creatinine, Ser  Date Value Ref Range Status  04/24/2022 0.96 0.57 - 1.00 mg/dL Final         Passed - Patient is not pregnant      Passed - Valid encounter within last 12 months    Recent Outpatient Visits           1 month ago COVID-19 virus infection   [place] [facility] at [facility], Jesse Sans, MD   3 months ago Annual physical exam   [place] [facility] at [facility], Jesse Sans, MD   1 year ago Right hip pain   [place] [facility] at [facility], Mickel Baas  H, MD   1 year ago Annual physical exam   [place] [facility] at [facility], Jesse Sans, MD   1 year ago Essential hypertension   [place] [facility] at [facility], Earley Abide, MD       Future Appointments             In 2 months Army Melia, Jesse Sans, MD [facility] at [facility], [facility]   In 8 months Army Melia, Jesse Sans, MD [place] [facility] at [facility], [facility]

## 2022-08-27 ENCOUNTER — Ambulatory Visit: Payer: Medicare HMO

## 2022-09-08 ENCOUNTER — Ambulatory Visit: Payer: Medicare HMO

## 2022-09-11 ENCOUNTER — Ambulatory Visit
Admission: RE | Admit: 2022-09-11 | Discharge: 2022-09-11 | Disposition: A | Discharge status: Home / Self Care | Payer: Medicare HMO | Source: Ambulatory Visit | Attending: Internal Medicine | Admitting: Internal Medicine

## 2022-09-11 ENCOUNTER — Ambulatory Visit (INDEPENDENT_AMBULATORY_CARE_PROVIDER_SITE_OTHER): Payer: Medicare HMO

## 2022-09-11 VITALS — BP 138/72 | HR 92 | Temp 97.8°F | Resp 20

## 2022-09-11 DIAGNOSIS — I714 Abdominal aortic aneurysm, without rupture, unspecified: Secondary | ICD-10-CM | POA: Insufficient documentation

## 2022-09-11 DIAGNOSIS — Z1152 Encounter for screening for COVID-19: Secondary | ICD-10-CM | POA: Insufficient documentation

## 2022-09-11 DIAGNOSIS — Z8616 Personal history of COVID-19: Secondary | ICD-10-CM | POA: Insufficient documentation

## 2022-09-11 DIAGNOSIS — J189 Pneumonia, unspecified organism: Secondary | ICD-10-CM

## 2022-09-11 DIAGNOSIS — R059 Cough, unspecified: Secondary | ICD-10-CM | POA: Diagnosis not present

## 2022-09-11 DIAGNOSIS — Z8679 Personal history of other diseases of the circulatory system: Secondary | ICD-10-CM | POA: Diagnosis not present

## 2022-09-11 DIAGNOSIS — R509 Fever, unspecified: Secondary | ICD-10-CM

## 2022-09-11 DIAGNOSIS — N1832 Chronic kidney disease, stage 3b: Secondary | ICD-10-CM | POA: Diagnosis not present

## 2022-09-11 DIAGNOSIS — J432 Centrilobular emphysema: Secondary | ICD-10-CM | POA: Insufficient documentation

## 2022-09-11 DIAGNOSIS — Z8673 Personal history of transient ischemic attack (TIA), and cerebral infarction without residual deficits: Secondary | ICD-10-CM | POA: Diagnosis not present

## 2022-09-11 DIAGNOSIS — J441 Chronic obstructive pulmonary disease with (acute) exacerbation: Secondary | ICD-10-CM

## 2022-09-11 DIAGNOSIS — R051 Acute cough: Secondary | ICD-10-CM | POA: Diagnosis not present

## 2022-09-11 DIAGNOSIS — R7303 Prediabetes: Secondary | ICD-10-CM | POA: Insufficient documentation

## 2022-09-11 DIAGNOSIS — I129 Hypertensive chronic kidney disease with stage 1 through stage 4 chronic kidney disease, or unspecified chronic kidney disease: Secondary | ICD-10-CM | POA: Diagnosis not present

## 2022-09-11 DIAGNOSIS — J90 Pleural effusion, not elsewhere classified: Secondary | ICD-10-CM | POA: Diagnosis not present

## 2022-09-11 DIAGNOSIS — Z8619 Personal history of other infectious and parasitic diseases: Secondary | ICD-10-CM | POA: Diagnosis not present

## 2022-09-11 LAB — RESP PANEL BY RT-PCR (RSV, FLU A&B, COVID)  RVPGX2
Influenza A by PCR: NEGATIVE
Influenza B by PCR: NEGATIVE
Resp Syncytial Virus by PCR: NEGATIVE
SARS Coronavirus 2 by RT PCR: NEGATIVE

## 2022-09-11 MED ORDER — IPRATROPIUM-ALBUTEROL 0.5-2.5 (3) MG/3ML IN SOLN
3.0000 mL | Freq: Once | RESPIRATORY_TRACT | Status: AC
  Administered 2022-09-11: 3 mL via RESPIRATORY_TRACT

## 2022-09-11 MED ORDER — ALBUTEROL SULFATE HFA 108 (90 BASE) MCG/ACT IN AERS: 1.0000 | INHALATION_SPRAY | Freq: Four times a day (QID) | RESPIRATORY_TRACT | 0 refills | Status: DC | PRN

## 2022-09-11 MED ORDER — PROMETHAZINE-DM 6.25-15 MG/5ML PO SYRP: 5.0000 mL | ORAL_SOLUTION | Freq: Four times a day (QID) | ORAL | 0 refills | Status: DC | PRN

## 2022-09-11 MED ORDER — LEVOFLOXACIN 750 MG PO TABS: 750.0000 mg | ORAL_TABLET | Freq: Every day | ORAL | 0 refills | Status: AC

## 2022-09-11 MED ORDER — PREDNISONE 20 MG PO TABS: 40.0000 mg | ORAL_TABLET | Freq: Every day | ORAL | 0 refills | Status: AC

## 2022-09-11 MED ORDER — ALBUTEROL SULFATE (2.5 MG/3ML) 0.083% IN NEBU: 2.5000 mg | INHALATION_SOLUTION | Freq: Four times a day (QID) | RESPIRATORY_TRACT | 1 refills | Status: DC | PRN

## 2022-09-11 NOTE — ED Triage Notes (Signed)
Pt reports coughing, body aches, SOB, and fever x 2 days. Took tylenol, inhaler, and cough syrup but no relief.

## 2022-09-11 NOTE — ED Provider Notes (Signed)
[facility]    CSN: [id number] Arrival date & time: 09/11/22  1043      History   Chief Complaint Chief Complaint  Patient presents with   URI    Possible flu - Entered by patient    HPI Tiffany Bonilla is a 67 y.o. female presenting for approximately 1 month history of cough that is productive of greenish sputum, chest tightness and pain when coughing, shortness of breath especially on exertion.  She says since yesterday she has felt increasingly fatigued and more short of breath and the cough is worsened.  She reports diffuse body aches as well.  She says she has been exposed to the flu by her grandson.  She reports fevers up to 101.5 degrees.  She was seen here on 08/06/2022 complaining of 2-week history of cough and was diagnosed with COPD exacerbation.  She was prescribed prednisone and Augmentin at that time.  She reports that she started to feel better but the cough did not totally go away.  History of COVID-19 in 2020 and in October 2023.  Patient does have history of emphysema.  States she does not have an inhaler as her COPD has been generally mild.  She has not really tried any OTC cough meds because she says she cannot take any liquids.  Other medical history significant for asthma, hypertension, prediabetes, previous stroke, hepatitis C and AAA.  HPI  Past Medical History:  Diagnosis Date   Allergy    Asthma    COVID 07/07/2022   Hyperlipidemia    Hypertension    Pre-diabetes 11/24/2018   Spina bifida occulta    Stroke [facility])    SVT (supraventricular tachycardia) 11/24/2018   S/p ablation at [facility]   Wears dentures    full upper    Patient Active Problem List   Diagnosis Date Noted   Centrilobular emphysema ([place]) 04/24/2022   Colon cancer screening    Polyp of descending colon    Right hip pain 06/19/2021   Chronic midline low back pain without sciatica 06/19/2021   Chronic kidney disease, stage 3b ([place]) 04/19/2021   Diarrhea 04/07/2021    Elevated troponin 04/07/2021   Postural dizziness with presyncope 04/07/2021   Atrophic kidney 09/27/2020   Aortic aneurysm, abdominal ([place]) 09/27/2020   Benign essential tremor 04/09/2020   Vitamin D deficiency 09/06/2019   Other specified anxiety disorders 82/99/3716   Lichen sclerosus et atrophicus of the vulva 11/25/2018   Essential hypertension 11/24/2018   Old lacunar stroke without late effect 11/24/2018   Current every day smoker 10/25/2015   Hx of hepatitis C 10/25/2015   CAD in native artery 05/24/2015   Mixed hyperlipidemia 06/12/2011    Past Surgical History:  Procedure Laterality Date   CARDIAC ELECTROPHYSIOLOGY STUDY AND ABLATION  2006   for SVT   CATARACT EXTRACTION W/PHACO Right 02/06/2021   Procedure: CATARACT EXTRACTION PHACO AND INTRAOCULAR LENS PLACEMENT ([place]) RIGHT;  Surgeon: Leandrew Koyanagi, MD;  Location: [place];  Service: Ophthalmology;  Laterality: Right;  2.85 0:32.6 8.7%   CATARACT EXTRACTION W/PHACO Left 02/20/2021   Procedure: CATARACT EXTRACTION PHACO AND INTRAOCULAR LENS PLACEMENT ([place]) LEFT;  Surgeon: Leandrew Koyanagi, MD;  Location: [place];  Service: Ophthalmology;  Laterality: Left;  3.12 0:39.5   COLONOSCOPY  2006   COLONOSCOPY WITH PROPOFOL N/A 10/25/2021   Procedure: COLONOSCOPY WITH BIOPSY;  Surgeon: [name], MD;  Location: [place];  Service: Endoscopy;  Laterality: N/A;   CORONARY ANGIOPLASTY WITH STENT  PLACEMENT  2001   to Circumfx   ESOPHAGOGASTRODUODENOSCOPY  2017   LAPAROSCOPIC CHOLECYSTECTOMY  04/25/2019   @ [place] RESECTION  11/07/2015   POLYPECTOMY N/A 10/25/2021   Procedure: POLYPECTOMY;  Surgeon: [name], MD;  Location: [place];  Service: Endoscopy;  Laterality: N/A;   TOTAL ABDOMINAL HYSTERECTOMY  1992   CIS cervix    OB History   No obstetric history on file.      Home Medications    Prior to Admission medications   Medication  Sig Start Date End Date Taking? Authorizing Provider  albuterol (PROVENTIL) (2.5 MG/3ML) 0.083% nebulizer solution Take 3 mLs (2.5 mg total) by nebulization every 6 (six) hours as needed for wheezing or shortness of breath. 09/11/22  Yes Laurene Footman B, PA-C  albuterol (VENTOLIN HFA) 108 (90 Base) MCG/ACT inhaler Inhale 1-2 puffs into the lungs every 6 (six) hours as needed. 09/11/22  Yes Danton Clap, PA-C  levofloxacin (LEVAQUIN) 750 MG tablet Take 1 tablet (750 mg total) by mouth daily for 5 days. 09/11/22 09/16/22 Yes Danton Clap, PA-C  predniSONE (DELTASONE) 20 MG tablet Take 2 tablets (40 mg total) by mouth daily for 5 days. 09/11/22 09/16/22 Yes Danton Clap, PA-C  promethazine-dextromethorphan (PROMETHAZINE-DM) 6.25-15 MG/5ML syrup Take 5 mLs by mouth 4 (four) times daily as needed. 09/11/22  Yes Danton Clap, PA-C  albuterol (VENTOLIN HFA) 108 (90 Base) MCG/ACT inhaler Inhale 2 puffs into the lungs every 6 (six) hours as needed for wheezing or shortness of breath. 04/24/22   Glean Hess, MD  aspirin EC 81 MG tablet Take 1 tablet by mouth daily.    [provider]  Black Elderberry 50 MG/5ML SYRP Take by mouth.    [provider]  cholecalciferol (VITAMIN D3) 25 MCG (1000 UNIT) tablet Take 2,000 Units by mouth daily.    [provider]  clobetasol cream (TEMOVATE) 0.05 % APPLY TOPICALLY TO THE AFFECTED AREA TWICE DAILY 08/07/22   Glean Hess, MD  escitalopram (LEXAPRO) 20 MG tablet TAKE 1 TABLET(20 MG) BY MOUTH DAILY 04/24/22   Glean Hess, MD  Evolocumab (REPATHA) 140 MG/ML SOSY Inject 140 mg into the skin every 14 (fourteen) days. 04/25/22   Glean Hess, MD  gabapentin (NEURONTIN) 100 MG capsule Take 2 capsules (200 mg total) by mouth 2 (two) times daily. 04/24/22   Glean Hess, MD  lisinopril (ZESTRIL) 10 MG tablet TAKE 1 TABLET(10 MG) BY MOUTH DAILY 06/20/22   Glean Hess, MD  Melatonin 10 MG TABS Take 1 tablet by mouth at  bedtime.    [provider]  Multiple Vitamins-Minerals (ALIVE WOMENS ENERGY) TABS Take by mouth.    [provider]  rosuvastatin (CRESTOR) 40 MG tablet TAKE 1 TABLET(40 MG) BY MOUTH DAILY 08/07/22   Glean Hess, MD  Vitamin D, Ergocalciferol, (DRISDOL) 1.25 MG (50000 UNIT) CAPS capsule Take 1 capsule (50,000 Units total) by mouth every 7 (seven) days. 04/25/22   Glean Hess, MD    Family History Family History  Problem Relation Age of Onset   Aneurysm Mother    Breast cancer Maternal Aunt     Social History Social History   Tobacco Use   Smoking status: Some Days    Packs/day: 0.50    Years: 40.00    Total pack years: 20.00    Types: Cigarettes    Start date: 65   Smokeless tobacco: Never  Vaping  Use   Vaping Use: Never used  Substance Use Topics   Alcohol use: Not Currently    Comment: rare   Drug use: Never     Allergies   Morphine, Scopolamine, Codeine, and Sulfa antibiotics   Review of Systems Review of Systems  Constitutional:  Positive for fatigue. Negative for chills, diaphoresis and fever.  HENT:  Positive for congestion, postnasal drip and rhinorrhea. Negative for ear pain, sinus pressure, sinus pain and sore throat.   Respiratory:  Positive for cough, chest tightness, shortness of breath and wheezing.   Cardiovascular:  Negative for chest pain.  Gastrointestinal:  Negative for abdominal pain, nausea and vomiting.  Musculoskeletal:  Positive for arthralgias and myalgias.  Skin:  Negative for rash.  Neurological:  Positive for headaches. Negative for weakness.  Hematological:  Negative for adenopathy.     Physical Exam Triage Vital Signs ED Triage Vitals  Enc Vitals Group     BP 01/10/22 1818 (!) 183/93     Pulse Rate 01/10/22 1818 67     Resp 01/10/22 1818 18     Temp 01/10/22 1818 98.4 F (36.9 C)     Temp Source 01/10/22 1818 Oral     SpO2 01/10/22 1818 96 %     Weight 01/10/22 1817 130 lb (59 kg)     Height  01/10/22 1817 '5\' 1"'$  (1.549 m)     Head Circumference --      Peak Flow --      Pain Score 01/10/22 1817 0     Pain Loc --      Pain Edu? --      Excl. in [place]? --    No data found.  Updated Vital Signs BP 138/72 (BP Location: Right Arm)   Pulse 92   Temp 97.8 F (36.6 C) (Oral)   Resp 20   SpO2 92%     Physical Exam Vitals and nursing note reviewed.  Constitutional:      General: She is not in acute distress.    Appearance: Normal appearance. She is ill-appearing. She is not toxic-appearing.  HENT:     Head: Normocephalic and atraumatic.     Nose: Congestion present.     Mouth/Throat:     Mouth: Mucous membranes are moist.     Pharynx: Oropharynx is clear. No posterior oropharyngeal erythema.  Eyes:     General: No scleral icterus.       Right eye: No discharge.        Left eye: No discharge.     Conjunctiva/sclera: Conjunctivae normal.  Cardiovascular:     Rate and Rhythm: Normal rate and regular rhythm.     Heart sounds: Normal heart sounds.  Pulmonary:     Effort: Pulmonary effort is normal. No respiratory distress.     Breath sounds: Wheezing present. No rhonchi.     Comments: +wheezes and rales LLL Musculoskeletal:     Cervical back: Neck supple.  Skin:    General: Skin is dry.  Neurological:     General: No focal deficit present.     Mental Status: She is alert. Mental status is at baseline.     Motor: No weakness.     Gait: Gait normal.  Psychiatric:        Mood and Affect: Mood normal.        Behavior: Behavior normal.        Thought Content: Thought content normal.      UC Treatments / Results  Labs (  all labs ordered are listed, but only abnormal results are displayed) Labs Reviewed  RESP PANEL BY RT-PCR (RSV, FLU A&B, COVID)  [id number]    EKG   Radiology DG Chest 2 View  Result Date: 09/11/2022 CLINICAL DATA:  Fever.  Cough. EXAM: CHEST - 2 VIEW COMPARISON:  None Available. FINDINGS: Pleural effusion. No pneumothorax. Normal cardiac and  mediastinal contours. Focal retrocardiac opacity is suspicious for infection. No displaced rib fractures. Visualized upper abdomen is unremarkable. Vertebral body heights are maintained. IMPRESSION: Focal retrocardiac opacity is suspicious for infection. Electronically Signed   By: Marin Roberts M.D.   On: 09/11/2022 11:59    Procedures Procedures (including critical care time)  Medications Ordered in UC Medications  ipratropium-albuterol (DUONEB) 0.5-2.5 (3) MG/3ML nebulizer solution 3 mL (3 mLs Nebulization Given 09/11/22 1216)    Initial Impression / Assessment and Plan / UC Course  I have reviewed the triage vital signs and the nursing notes.  Pertinent labs & imaging results that were available during my care of the patient were reviewed by me and considered in my medical decision making (see chart for details).  67 year old female with history of COPD and asthma presents for >1 month history of fatigue, cough, congestion, chest tightness and shortness of breath.  Treated for COPD exacerbation last month with Augmentin and prednisone.  Reports yesterday her symptoms acutely worsened and she developed fevers up to 101.5 degrees, increased fatigue and worsening cough and shortness of breath.  Reports that she gets really short of breath when she has to walk any distance.    Vitals are all normal and stable.  She is ill-appearing and appears very fatigued.  She is in no acute distress.  Oxygen is a little low at 92%. On exam she has nasal congestion and wheezing and rales of the left lower lobe.  Respiratory panel performed as well as chest x-ray.  Respiratory panel is all negative.  Chest x-ray suggests retrocardiac infection.  Suspect patient has pneumonia.  Patient given DuoNeb treat in clinic.  Advised patient that she has pneumonia.  She just had Augmentin last month.  She has COPD and asthma comorbidities.  Will step up to Levaquin at this time.  Also sent prednisone, Promethazine DM  and albuterol.  Reviewed following up with PCP at next available appointment.  Reviewed that she needs repeat x-ray in 4 to 6 weeks for assessing resolution of infection.  Reviewed return and ER precautions with her.  Advised her to have a low threshold for going to the emergency department.   Final Clinical Impressions(s) / UC Diagnoses   Final diagnoses:  Pneumonia of left lung due to infectious organism, unspecified part of lung  Fever, unspecified  Acute cough  COPD exacerbation ([place])     Discharge Instructions      -You are negative for COVID, flu and RSV but it does look like you have pneumonia. - I sent antibiotics to the pharmacy.  These are very strong antibiotics.  Make sure you take full course. - I have also sent cough medicine, prednisone and albuterol.  Use your breathing treatments as needed every 4-6 hours. - If you are not breaking a fever in the next couple days or you are feeling more fatigued or more short of breath or you are noticing no improvement you should go to the emergency department. - Otherwise, follow-up with your PCP at next available appointment.  You will need repeat x-ray in about 4 to 6 weeks to make sure  the infection is cleared.       ED Prescriptions     Medication Sig Dispense Auth. Provider   levofloxacin (LEVAQUIN) 750 MG tablet Take 1 tablet (750 mg total) by mouth daily for 5 days. 5 tablet Danton Clap, PA-C   promethazine-dextromethorphan (PROMETHAZINE-DM) 6.25-15 MG/5ML syrup Take 5 mLs by mouth 4 (four) times daily as needed. 118 mL Laurene Footman B, PA-C   predniSONE (DELTASONE) 20 MG tablet Take 2 tablets (40 mg total) by mouth daily for 5 days. 10 tablet Laurene Footman B, PA-C   albuterol (VENTOLIN HFA) 108 (90 Base) MCG/ACT inhaler Inhale 1-2 puffs into the lungs every 6 (six) hours as needed. 1 g Laurene Footman B, PA-C   albuterol (PROVENTIL) (2.5 MG/3ML) 0.083% nebulizer solution Take 3 mLs (2.5 mg total) by nebulization every 6  (six) hours as needed for wheezing or shortness of breath. 75 mL Danton Clap, PA-C      PDMP not reviewed this encounter.     Danton Clap, PA-C 09/11/22 1226

## 2022-09-11 NOTE — Discharge Instructions (Addendum)
-  You are negative for COVID, flu and RSV but it does look like you have pneumonia. - I sent antibiotics to the pharmacy.  These are very strong antibiotics.  Make sure you take full course. - I have also sent cough medicine, prednisone and albuterol.  Use your breathing treatments as needed every 4-6 hours. - If you are not breaking a fever in the next couple days or you are feeling more fatigued or more short of breath or you are noticing no improvement you should go to the emergency department. - Otherwise, follow-up with your PCP at next available appointment.  You will need repeat x-ray in about 4 to 6 weeks to make sure the infection is cleared.

## 2022-09-12 ENCOUNTER — Telehealth: Payer: Self-pay | Admitting: Internal Medicine

## 2022-09-12 NOTE — Telephone Encounter (Signed)
Spoke with patient she stated she had pneumonia and req CB at later time.

## 2022-09-13 DIAGNOSIS — I63232 Cerebral infarction due to unspecified occlusion or stenosis of left carotid arteries: Secondary | ICD-10-CM | POA: Diagnosis not present

## 2022-09-13 DIAGNOSIS — J189 Pneumonia, unspecified organism: Secondary | ICD-10-CM | POA: Diagnosis not present

## 2022-09-13 DIAGNOSIS — I6523 Occlusion and stenosis of bilateral carotid arteries: Secondary | ICD-10-CM | POA: Diagnosis not present

## 2022-09-13 DIAGNOSIS — E119 Type 2 diabetes mellitus without complications: Secondary | ICD-10-CM | POA: Diagnosis not present

## 2022-09-13 DIAGNOSIS — R410 Disorientation, unspecified: Secondary | ICD-10-CM | POA: Diagnosis not present

## 2022-09-13 DIAGNOSIS — R55 Syncope and collapse: Secondary | ICD-10-CM | POA: Diagnosis not present

## 2022-09-13 DIAGNOSIS — I63512 Cerebral infarction due to unspecified occlusion or stenosis of left middle cerebral artery: Secondary | ICD-10-CM | POA: Diagnosis not present

## 2022-09-13 DIAGNOSIS — I6389 Other cerebral infarction: Secondary | ICD-10-CM | POA: Diagnosis not present

## 2022-09-13 DIAGNOSIS — I4891 Unspecified atrial fibrillation: Secondary | ICD-10-CM | POA: Diagnosis not present

## 2022-09-13 DIAGNOSIS — Z1152 Encounter for screening for COVID-19: Secondary | ICD-10-CM | POA: Diagnosis not present

## 2022-09-13 DIAGNOSIS — I6522 Occlusion and stenosis of left carotid artery: Secondary | ICD-10-CM | POA: Diagnosis not present

## 2022-09-13 DIAGNOSIS — I4719 Other supraventricular tachycardia: Secondary | ICD-10-CM | POA: Diagnosis not present

## 2022-09-13 DIAGNOSIS — M25562 Pain in left knee: Secondary | ICD-10-CM | POA: Diagnosis not present

## 2022-09-13 DIAGNOSIS — E8809 Other disorders of plasma-protein metabolism, not elsewhere classified: Secondary | ICD-10-CM | POA: Diagnosis not present

## 2022-09-13 DIAGNOSIS — F32A Depression, unspecified: Secondary | ICD-10-CM | POA: Diagnosis not present

## 2022-09-13 DIAGNOSIS — T1490XA Injury, unspecified, initial encounter: Secondary | ICD-10-CM | POA: Diagnosis not present

## 2022-09-13 DIAGNOSIS — R251 Tremor, unspecified: Secondary | ICD-10-CM | POA: Diagnosis not present

## 2022-09-13 DIAGNOSIS — Z8673 Personal history of transient ischemic attack (TIA), and cerebral infarction without residual deficits: Secondary | ICD-10-CM | POA: Diagnosis not present

## 2022-09-13 DIAGNOSIS — J441 Chronic obstructive pulmonary disease with (acute) exacerbation: Secondary | ICD-10-CM | POA: Diagnosis not present

## 2022-09-13 DIAGNOSIS — I6529 Occlusion and stenosis of unspecified carotid artery: Secondary | ICD-10-CM | POA: Diagnosis not present

## 2022-09-13 DIAGNOSIS — I7090 Unspecified atherosclerosis: Secondary | ICD-10-CM | POA: Diagnosis not present

## 2022-09-13 DIAGNOSIS — I10 Essential (primary) hypertension: Secondary | ICD-10-CM | POA: Diagnosis not present

## 2022-09-13 DIAGNOSIS — G939 Disorder of brain, unspecified: Secondary | ICD-10-CM | POA: Diagnosis not present

## 2022-09-13 DIAGNOSIS — Z8679 Personal history of other diseases of the circulatory system: Secondary | ICD-10-CM | POA: Diagnosis not present

## 2022-09-13 DIAGNOSIS — I69398 Other sequelae of cerebral infarction: Secondary | ICD-10-CM | POA: Diagnosis not present

## 2022-09-13 DIAGNOSIS — Y92410 Unspecified street and highway as the place of occurrence of the external cause: Secondary | ICD-10-CM | POA: Diagnosis not present

## 2022-09-14 DIAGNOSIS — M25562 Pain in left knee: Secondary | ICD-10-CM | POA: Diagnosis not present

## 2022-09-14 DIAGNOSIS — T1490XA Injury, unspecified, initial encounter: Secondary | ICD-10-CM | POA: Diagnosis not present

## 2022-09-14 DIAGNOSIS — E119 Type 2 diabetes mellitus without complications: Secondary | ICD-10-CM | POA: Diagnosis not present

## 2022-09-14 DIAGNOSIS — Z8679 Personal history of other diseases of the circulatory system: Secondary | ICD-10-CM | POA: Diagnosis not present

## 2022-09-14 DIAGNOSIS — I6523 Occlusion and stenosis of bilateral carotid arteries: Secondary | ICD-10-CM | POA: Diagnosis not present

## 2022-09-14 DIAGNOSIS — I6522 Occlusion and stenosis of left carotid artery: Secondary | ICD-10-CM | POA: Diagnosis not present

## 2022-09-14 DIAGNOSIS — R55 Syncope and collapse: Secondary | ICD-10-CM | POA: Diagnosis not present

## 2022-09-14 DIAGNOSIS — Z8673 Personal history of transient ischemic attack (TIA), and cerebral infarction without residual deficits: Secondary | ICD-10-CM | POA: Diagnosis not present

## 2022-09-14 LAB — LIPID PANEL
Cholesterol: 105 (ref 0–200)
HDL: 50 (ref 35–70)
LDL Cholesterol: 41
Triglycerides: 68 (ref 40–160)

## 2022-09-15 DIAGNOSIS — I6522 Occlusion and stenosis of left carotid artery: Secondary | ICD-10-CM | POA: Diagnosis not present

## 2022-09-15 DIAGNOSIS — R55 Syncope and collapse: Secondary | ICD-10-CM | POA: Diagnosis not present

## 2022-09-16 ENCOUNTER — Telehealth: Payer: Self-pay | Admitting: Internal Medicine

## 2022-09-16 NOTE — Telephone Encounter (Signed)
Spoke with patient today she was in hospital.  WCB at later date.

## 2022-09-18 DIAGNOSIS — R55 Syncope and collapse: Secondary | ICD-10-CM | POA: Diagnosis not present

## 2022-09-19 ENCOUNTER — Telehealth: Payer: Self-pay

## 2022-09-19 ENCOUNTER — Telehealth: Payer: Self-pay | Admitting: *Deleted

## 2022-09-19 NOTE — Progress Notes (Signed)
  Care Coordination  Note  09/19/2022 Name: Joby Hershkowitz MRN: [id number] DOB: [birth year]  Dakiya Puopolo is a 67 y.o. year old primary care patient of Glean Hess, MD. I reached out to [name] by phone today to assist with scheduling a follow up appointment. [name] verbally consented to my assistance.       Follow up plan: Hospital Follow Up appointment scheduled with (Dr Army Melia) on (09/22/2022) at (2pm).  Julian Hy, [place] Direct Dial: [phone]
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
## 2022-09-19 NOTE — Progress Notes (Signed)
  Care Coordination  Note  09/19/2022 Name: Tiffany Bonilla MRN: [id number] DOB: 27-Feb-1956  Tiffany Bonilla is a 67 y.o. year old primary care patient of Glean Hess, MD. I reached out to [name] by phone today to assist with scheduling a follow up appointment. [name] verbally consented to my assistance.       Follow up plan: Unsuccessful telephone outreach attempt made. A HIPAA compliant phone message was left for the patient providing contact information and requesting a return call.   Julian Hy, [place] Direct Dial: [phone]

## 2022-09-22 ENCOUNTER — Ambulatory Visit (INDEPENDENT_AMBULATORY_CARE_PROVIDER_SITE_OTHER): Payer: Medicare HMO | Admitting: Internal Medicine

## 2022-09-22 ENCOUNTER — Encounter: Payer: Self-pay | Admitting: Internal Medicine

## 2022-09-22 VITALS — BP 120/68 | HR 70 | Ht 61.0 in | Wt 146.0 lb

## 2022-09-22 DIAGNOSIS — I69320 Aphasia following cerebral infarction: Secondary | ICD-10-CM | POA: Diagnosis not present

## 2022-09-22 DIAGNOSIS — I6522 Occlusion and stenosis of left carotid artery: Secondary | ICD-10-CM

## 2022-09-22 DIAGNOSIS — Z8673 Personal history of transient ischemic attack (TIA), and cerebral infarction without residual deficits: Secondary | ICD-10-CM

## 2022-09-22 DIAGNOSIS — F17200 Nicotine dependence, unspecified, uncomplicated: Secondary | ICD-10-CM | POA: Diagnosis not present

## 2022-09-22 NOTE — Assessment & Plan Note (Signed)
Treated acutely with heparin the discharged on ASA and Plavix Plan for out patient Left CEA with VS

## 2022-09-22 NOTE — Assessment & Plan Note (Signed)
Has quit again for the past 8 days using patches Also has lozenges if needed
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
## 2022-09-22 NOTE — Assessment & Plan Note (Signed)
Has not yet returned to work Has ST and PT

## 2022-09-23 DIAGNOSIS — R7303 Prediabetes: Secondary | ICD-10-CM | POA: Diagnosis not present

## 2022-09-23 DIAGNOSIS — J449 Chronic obstructive pulmonary disease, unspecified: Secondary | ICD-10-CM | POA: Diagnosis not present

## 2022-09-23 DIAGNOSIS — I6522 Occlusion and stenosis of left carotid artery: Secondary | ICD-10-CM | POA: Diagnosis not present

## 2022-09-23 DIAGNOSIS — Z8541 Personal history of malignant neoplasm of cervix uteri: Secondary | ICD-10-CM | POA: Diagnosis not present

## 2022-09-23 DIAGNOSIS — E782 Mixed hyperlipidemia: Secondary | ICD-10-CM | POA: Diagnosis not present

## 2022-09-23 DIAGNOSIS — I2510 Atherosclerotic heart disease of native coronary artery without angina pectoris: Secondary | ICD-10-CM | POA: Diagnosis not present

## 2022-09-23 DIAGNOSIS — Z87891 Personal history of nicotine dependence: Secondary | ICD-10-CM | POA: Diagnosis not present

## 2022-09-23 DIAGNOSIS — F419 Anxiety disorder, unspecified: Secondary | ICD-10-CM | POA: Diagnosis not present

## 2022-09-23 DIAGNOSIS — G4733 Obstructive sleep apnea (adult) (pediatric): Secondary | ICD-10-CM | POA: Diagnosis not present

## 2022-09-23 DIAGNOSIS — F32A Depression, unspecified: Secondary | ICD-10-CM | POA: Diagnosis not present

## 2022-09-23 DIAGNOSIS — I471 Supraventricular tachycardia, unspecified: Secondary | ICD-10-CM | POA: Diagnosis not present

## 2022-09-23 DIAGNOSIS — Z720 Tobacco use: Secondary | ICD-10-CM | POA: Diagnosis not present

## 2022-09-23 DIAGNOSIS — Z8673 Personal history of transient ischemic attack (TIA), and cerebral infarction without residual deficits: Secondary | ICD-10-CM | POA: Diagnosis not present

## 2022-09-23 DIAGNOSIS — Z01818 Encounter for other preprocedural examination: Secondary | ICD-10-CM | POA: Diagnosis not present

## 2022-09-23 DIAGNOSIS — E8809 Other disorders of plasma-protein metabolism, not elsewhere classified: Secondary | ICD-10-CM | POA: Diagnosis not present

## 2022-09-23 DIAGNOSIS — I10 Essential (primary) hypertension: Secondary | ICD-10-CM | POA: Diagnosis not present

## 2022-09-23 DIAGNOSIS — Z955 Presence of coronary angioplasty implant and graft: Secondary | ICD-10-CM | POA: Diagnosis not present

## 2022-09-23 DIAGNOSIS — I4891 Unspecified atrial fibrillation: Secondary | ICD-10-CM | POA: Diagnosis not present

## 2022-09-29 ENCOUNTER — Telehealth: Payer: Self-pay | Admitting: *Deleted

## 2022-09-29 NOTE — Patient Outreach (Signed)
  Care Coordination [facility] Note Transition Care Management Unsuccessful Follow-up Telephone Call  Date of discharge and from where:  [facility] [phone] Carotid Artery Thrombosis  Attempts:  1st Attempt  Reason for unsuccessful TCM follow-up call:  Left voice message   [place] Care Management [phone]

## 2022-10-01 ENCOUNTER — Telehealth: Payer: Self-pay | Admitting: *Deleted

## 2022-10-01 NOTE — Patient Outreach (Signed)
  Care Coordination TOC Note Transition Care Management Follow-up Telephone Call Date of discharge and from where: [facility] [id number] Carotid Artery Thrombosis How have you been since you were released from the hospital? I am still having quite a bit of pain on the side of my neck. The tylenol is helping Any questions or concerns? No  Items Reviewed: Did the pt receive and understand the discharge instructions provided? Yes  Medications obtained and verified? Yes  Other? No  Any new allergies since your discharge? No  Dietary orders reviewed? No Do you have support at home? Yes   Home Care and Equipment/Supplies: Were home health services ordered? no If so, what is the name of the agency? N/a  Has the agency set up a time to come to the patient's home? not applicable Were any new equipment or medical supplies ordered?  No What is the name of the medical supply agency? n Were you able to get the supplies/equipment? not applicable Do you have any questions related to the use of the equipment or supplies? No  Functional Questionnaire: (I = Independent and D = Dependent) ADLs: D  Bathing/Dressing- I  Meal Prep- I  Eating- I  Maintaining continence- I  Transferring/Ambulation- I  Managing Meds- I  Follow up appointments reviewed:  PCP Hospital f/u appt confirmed? Yes  Scheduled to see Dr Army Melia [phone] 8:05 . [facility] f/u appt confirmed? Yes   [id number] Dr Berline Lopes 2:45 Vascular [phone] 9:15 Are transportation arrangements needed? No  If their condition worsens, is the pt aware to call PCP or go to the Emergency Dept.? Yes Was the patient provided with contact information for the PCP's office or ED? Yes Was to pt encouraged to call back with questions or concerns? Yes  SDOH assessments and interventions completed:   Yes SDOH Interventions Today    Flowsheet Row Most Recent Value  SDOH Interventions   Food Insecurity Interventions Intervention Not Indicated       Interventions Today    Flowsheet Row Most Recent Value  Exercise Interventions   Exercise Discussed/Reviewed Physical Activity  Physical Activity Discussed/Reviewed Physical Activity Discussed  [Discussed limited activity no lifting anything greater that 5 pounds for 2 weeks]       Care Coordination Interventions:  See interventions above    Encounter Outcome:  Pt. Visit Completed    [facility] [phone]

## 2022-10-23 ENCOUNTER — Telehealth: Payer: Self-pay | Admitting: Internal Medicine

## 2022-10-23 DIAGNOSIS — I6523 Occlusion and stenosis of bilateral carotid arteries: Secondary | ICD-10-CM | POA: Diagnosis not present

## 2022-10-23 NOTE — Telephone Encounter (Signed)
Contacted Tiffany Bonilla to schedule their annual wellness visit. Appointment made for 11/12/2022.  Sherol Dade; Care Guide Ambulatory Clinical [facility] Direct Dial: [phone]

## 2022-10-25 DIAGNOSIS — R55 Syncope and collapse: Secondary | ICD-10-CM | POA: Diagnosis not present

## 2022-10-27 ENCOUNTER — Ambulatory Visit: Payer: Medicare HMO | Admitting: Internal Medicine

## 2022-10-27 NOTE — Assessment & Plan Note (Deleted)
Tolerating statin medications without concerns.  Repatha added last visit. Last LDL is  Lab Results  Component Value Date   LDLCALC 87 04/24/2022  With a goal of < 55

## 2022-10-27 NOTE — Assessment & Plan Note (Deleted)
Repatha added visit for LDL 87

## 2022-10-27 NOTE — Assessment & Plan Note (Deleted)
Clinically stable exam with well controlled BP on lisinopril. Tolerating medications without side effects. Pt to continue current regimen and low sodium diet.

## 2022-10-27 NOTE — Progress Notes (Deleted)
Date:  10/27/2022   Name:  Tiffany Bonilla   DOB:  1956-03-17   MRN:  [id number]   Chief Complaint: No chief complaint on file.  Hypertension This is a chronic problem. The problem is controlled. Pertinent negatives include no chest pain, headaches, palpitations or shortness of breath. Past treatments include ACE inhibitors. Hypertensive end-organ damage includes CAD/MI. There is no history of kidney disease or CVA.  Hyperlipidemia This is a chronic problem. The problem is controlled. Pertinent negatives include no chest pain or shortness of breath. Current antihyperlipidemic treatment includes statins (and Repatha).    Lab Results  Component Value Date   NA 145 (H) 04/24/2022   K 4.2 04/24/2022   CO2 20 04/24/2022   GLUCOSE 85 04/24/2022   BUN 10 04/24/2022   CREATININE 0.96 04/24/2022   CALCIUM 8.9 04/24/2022   EGFR 65 04/24/2022   GFRNONAA 50 (L) 04/09/2020   Lab Results  Component Value Date   CHOL 157 04/24/2022   HDL 50 04/24/2022   LDLCALC 87 04/24/2022   TRIG 111 04/24/2022   CHOLHDL 3.1 04/24/2022   Lab Results  Component Value Date   TSH 1.980 04/24/2022   Lab Results  Component Value Date   HGBA1C 5.5 02/16/2019   Lab Results  Component Value Date   WBC 8.0 04/24/2022   HGB 14.6 04/24/2022   HCT 43.5 04/24/2022   MCV 93 04/24/2022   PLT 182 04/24/2022   Lab Results  Component Value Date   ALT 16 04/24/2022   AST 22 04/24/2022   ALKPHOS 69 04/24/2022   BILITOT 0.3 04/24/2022   Lab Results  Component Value Date   VD25OH 13.3 (L) 04/24/2022     Review of Systems  Constitutional:  Negative for fatigue and unexpected weight change.  HENT:  Negative for nosebleeds.   Eyes:  Negative for visual disturbance.  Respiratory:  Negative for cough, chest tightness, shortness of breath and wheezing.   Cardiovascular:  Negative for chest pain, palpitations and leg swelling.  Gastrointestinal:  Negative for abdominal pain, constipation and  diarrhea.  Neurological:  Negative for dizziness, weakness, light-headedness and headaches.    Patient Active Problem List   Diagnosis Date Noted   Chronic ischemic left middle cerebral artery (MCA) stroke 09/22/2022   Carotid artery thrombosis, left 09/13/2022   Centrilobular emphysema ([place]) 04/24/2022   Colon cancer screening    Polyp of descending colon    Right hip pain 06/19/2021   Chronic midline low back pain without sciatica 06/19/2021   Chronic kidney disease, stage 3b ([place]) 04/19/2021   Diarrhea 04/07/2021   Elevated troponin 04/07/2021   Postural dizziness with presyncope 04/07/2021   Atrophic kidney 09/27/2020   Aortic aneurysm, abdominal ([place]) 09/27/2020   Benign essential tremor 04/09/2020   Vitamin D deficiency 09/06/2019   Other specified anxiety disorders [id number]   Lichen sclerosus et atrophicus of the vulva 11/25/2018   Essential hypertension 11/24/2018   Old lacunar stroke without late effect 11/24/2018   Current every day smoker 10/25/2015   Hx of hepatitis C 10/25/2015   CAD in native artery 05/24/2015   Mixed hyperlipidemia 06/12/2011    Allergies  Allergen Reactions   Morphine Hives and Shortness Of Breath    Stopped breathing    Scopolamine Other (See Comments)    Unconscious    Codeine Nausea Only and Other (See Comments)    NAUSEA/VOMITING    Sulfa Antibiotics Hives and Nausea Only    Other Reaction: Not Assessed  Past Surgical History:  Procedure Laterality Date   CARDIAC ELECTROPHYSIOLOGY STUDY AND ABLATION  2006   for SVT   CATARACT EXTRACTION W/PHACO Right 02/06/2021   Procedure: CATARACT EXTRACTION PHACO AND INTRAOCULAR LENS PLACEMENT ([place]) RIGHT;  Surgeon: Leandrew Koyanagi, MD;  Location: [facility];  Service: Ophthalmology;  Laterality: Right;  2.85 0:32.6 8.7%   CATARACT EXTRACTION W/PHACO Left 02/20/2021   Procedure: CATARACT EXTRACTION PHACO AND INTRAOCULAR LENS PLACEMENT ([place]) LEFT;  Surgeon: Leandrew Koyanagi, MD;  Location: [facility];  Service: Ophthalmology;  Laterality: Left;  3.12 0:39.5   COLONOSCOPY  2006   COLONOSCOPY WITH PROPOFOL N/A 10/25/2021   Procedure: COLONOSCOPY WITH BIOPSY;  Surgeon: [name], MD;  Location: [facility];  Service: Endoscopy;  Laterality: N/A;   CORONARY ANGIOPLASTY WITH STENT PLACEMENT  2001   to Circumfx   ESOPHAGOGASTRODUODENOSCOPY  2017   LAPAROSCOPIC CHOLECYSTECTOMY  04/25/2019   @ [place] RESECTION  11/07/2015   POLYPECTOMY N/A 10/25/2021   Procedure: POLYPECTOMY;  Surgeon: [name], MD;  Location: [facility];  Service: Endoscopy;  Laterality: N/A;   TOTAL ABDOMINAL HYSTERECTOMY  1992   CIS cervix    Social History   Tobacco Use   Smoking status: Some Days    Packs/day: 0.50    Years: 40.00    Total pack years: 20.00    Types: Cigarettes    Start date: 46   Smokeless tobacco: Never  Vaping Use   Vaping Use: Never used  Substance Use Topics   Alcohol use: Not Currently    Comment: rare   Drug use: Never     Medication list has been reviewed and updated.  No outpatient medications have been marked as taking for the 10/27/22 encounter (Appointment) with Glean Hess, MD.       09/22/2022    2:05 PM 06/10/2022   10:01 AM 04/24/2022    8:32 AM 06/19/2021    1:42 PM  GAD 7 : Generalized Anxiety Score  Nervous, Anxious, on Edge 0 0 0 0  Control/stop worrying 0 0 0 0  Worry too much - different things 0 0 0 0  Trouble relaxing 0 0 0 0  Restless 0 0 0 0  Easily annoyed or irritable 0 0 0 0  Afraid - awful might happen 0 0 0 0  Total GAD 7 Score 0 0 0 0  Anxiety Difficulty Not difficult at all Not difficult at all Not difficult at all Not difficult at all       09/22/2022    2:05 PM 06/10/2022   10:00 AM 04/24/2022    8:32 AM  Depression screen PHQ 2/9  Decreased Interest 0 0 0  Down, Depressed, Hopeless 0 0 0  PHQ - 2 Score 0 0 0  Altered sleeping 0 1 0   Tired, decreased energy 0 2 0  Change in appetite 0 1 0  Feeling bad or failure about yourself  0 0 0  Trouble concentrating 0 0 0  Moving slowly or fidgety/restless 0 0 0  Suicidal thoughts 0 0 0  PHQ-9 Score 0 4 0  Difficult doing work/chores Not difficult at all Not difficult at all Not difficult at all    BP Readings from Last 3 Encounters:  09/22/22 120/68  09/11/22 138/72  08/06/22 (!) 180/82    Physical Exam Vitals and nursing note reviewed.  Constitutional:      General: She is not in acute distress.  Appearance: She is well-developed.  HENT:     Head: Normocephalic and atraumatic.  Pulmonary:     Effort: Pulmonary effort is normal. No respiratory distress.  Skin:    General: Skin is warm and dry.     Findings: No rash.  Neurological:     Mental Status: She is alert and oriented to person, place, and time.  Psychiatric:        Mood and Affect: Mood normal.        Behavior: Behavior normal.     Wt Readings from Last 3 Encounters:  09/22/22 146 lb (66.2 kg)  04/24/22 138 lb (62.6 kg)  01/10/22 130 lb (59 kg)    There were no vitals taken for this visit.  Assessment and Plan: Problem List Items Addressed This Visit       Cardiovascular and Mediastinum   Essential hypertension - Primary (Chronic)    Clinically stable exam with well controlled BP on lisinopril. Tolerating medications without side effects. Pt to continue current regimen and low sodium diet.       CAD in native artery (Chronic)    Repatha added visit for LDL 87        Other   Mixed hyperlipidemia (Chronic)    Tolerating statin medications without concerns.  Repatha added last visit. Last LDL is  Lab Results  Component Value Date   LDLCALC 87 04/24/2022  With a goal of < 55         Partially dictated using [place]. Any errors are unintentional.  Halina Maidens, MD [facility]  10/27/2022

## 2022-10-28 ENCOUNTER — Encounter: Payer: Self-pay | Admitting: Internal Medicine

## 2022-10-28 DIAGNOSIS — I10 Essential (primary) hypertension: Secondary | ICD-10-CM | POA: Diagnosis not present

## 2022-10-28 DIAGNOSIS — I2510 Atherosclerotic heart disease of native coronary artery without angina pectoris: Secondary | ICD-10-CM | POA: Diagnosis not present

## 2022-10-28 DIAGNOSIS — I6522 Occlusion and stenosis of left carotid artery: Secondary | ICD-10-CM | POA: Diagnosis not present

## 2022-10-28 DIAGNOSIS — Z87891 Personal history of nicotine dependence: Secondary | ICD-10-CM | POA: Diagnosis not present

## 2022-10-31 ENCOUNTER — Other Ambulatory Visit: Payer: Self-pay | Admitting: Internal Medicine

## 2022-10-31 DIAGNOSIS — F418 Other specified anxiety disorders: Secondary | ICD-10-CM

## 2022-11-04 ENCOUNTER — Other Ambulatory Visit: Payer: Self-pay | Admitting: Internal Medicine

## 2022-11-04 DIAGNOSIS — G250 Essential tremor: Secondary | ICD-10-CM

## 2022-11-04 DIAGNOSIS — E782 Mixed hyperlipidemia: Secondary | ICD-10-CM

## 2022-11-11 ENCOUNTER — Other Ambulatory Visit: Payer: Self-pay | Admitting: Family Medicine

## 2022-11-11 DIAGNOSIS — I10 Essential (primary) hypertension: Secondary | ICD-10-CM

## 2022-11-11 NOTE — Telephone Encounter (Signed)
Rx 06/20/22 #90 1RF- too soon Requested Prescriptions  Pending Prescriptions Disp Refills   lisinopril (ZESTRIL) 10 MG tablet [Pharmacy Med Name: LISINOPRIL '10MG'$  TABLETS] 90 tablet 1    Sig: TAKE 1 TABLET(10 MG) BY MOUTH DAILY     Cardiovascular:  ACE Inhibitors Failed - 11/11/2022  3:33 AM      Failed - Cr in normal range and within 180 days    Creatinine, Ser  Date Value Ref Range Status  04/24/2022 0.96 0.57 - 1.00 mg/dL Final         Failed - K in normal range and within 180 days    Potassium  Date Value Ref Range Status  04/24/2022 4.2 3.5 - 5.2 mmol/L Final         Passed - Patient is not pregnant      Passed - Last BP in normal range    BP Readings from Last 1 Encounters:  09/22/22 120/68         Passed - Valid encounter within last 6 months    Recent Outpatient Visits           1 month ago Carotid artery thrombosis, left   [facility] at [place], Jesse Sans, MD   5 months ago COVID-19 virus infection   [facility] at [facility], Jesse Sans, MD   6 months ago Annual physical exam   [facility] at [facility], Jesse Sans, MD   1 year ago Right hip pain   [facility] [facility] at [facility], Jesse Sans, MD   1 year ago Annual physical exam   [place] at [facility], Jesse Sans, MD       Future Appointments             In 2 weeks Army Melia, Jesse Sans, MD [place] at [facility], [facility]   In 5 months Army Melia, Jesse Sans, MD [place] at [facility], [facility]

## 2022-11-12 ENCOUNTER — Other Ambulatory Visit: Payer: Self-pay | Admitting: Internal Medicine

## 2022-11-12 ENCOUNTER — Ambulatory Visit (INDEPENDENT_AMBULATORY_CARE_PROVIDER_SITE_OTHER): Payer: Medicare HMO

## 2022-11-12 VITALS — Ht 61.0 in | Wt 146.0 lb

## 2022-11-12 DIAGNOSIS — Z122 Encounter for screening for malignant neoplasm of respiratory organs: Secondary | ICD-10-CM

## 2022-11-12 DIAGNOSIS — Z0000 Encounter for general adult medical examination without abnormal findings: Secondary | ICD-10-CM

## 2022-11-12 DIAGNOSIS — I10 Essential (primary) hypertension: Secondary | ICD-10-CM

## 2022-11-12 MED ORDER — LISINOPRIL 10 MG PO TABS: ORAL_TABLET | ORAL | 1 refills | Status: DC

## 2022-11-12 NOTE — Patient Instructions (Signed)
Tiffany Bonilla , Thank you for taking time to come for your Medicare Wellness Visit. I appreciate your ongoing commitment to your health goals. Please review the following plan we discussed and let me know if I can assist you in the future.   These are the goals we discussed:  Goals      DIET - EAT MORE FRUITS AND VEGETABLES     Quit Smoking     If you wish to quit smoking, help is available. For free tobacco cessation program offerings call the [facility] at [phone] or Live Well Line at [phone]. You may also visit [email or web address] or email [email or web address] for more information on other programs.          This is a list of the screening recommended for you and due dates:  Health Maintenance  Topic Date Due   DTaP/Tdap/Td vaccine (2 - Td or Tdap) 10/30/2020   Zoster (Shingles) Vaccine (2 of 2) 06/13/2021   Screening for Lung Cancer  09/14/2021   COVID-19 Vaccine (5 - 2023-24 season) 05/02/2022   Mammogram  06/26/2023   Medicare Annual Wellness Visit  11/12/2023   Pneumonia Vaccine (3 of 3 - PPSV23 or PCV20) 11/25/2023   Colon Cancer Screening  10/25/2024   Flu Shot  Completed   DEXA scan (bone density measurement)  Completed   Hepatitis C Screening: USPSTF Recommendation to screen - Ages 69-79 yo.  Completed   HPV Vaccine  Aged Out    Advanced directives: no  Conditions/risks identified: none  Next appointment: Follow up in one year for your annual wellness visit 11/18/23 @ 9:15 am by phone   Preventive Care 65 Years and Older, Female Preventive care refers to lifestyle choices and visits with your health care provider that can promote health and wellness. What does preventive care include? A yearly physical exam. This is also called an annual well check. Dental exams once or twice a year. Routine eye exams. Ask your health care provider how often you should have your eyes checked. Personal lifestyle choices, including: Daily care of  your teeth and gums. Regular physical activity. Eating a healthy diet. Avoiding tobacco and drug use. Limiting alcohol use. Practicing safe sex. Taking low-dose aspirin every day. Taking vitamin and mineral supplements as recommended by your health care provider. What happens during an annual well check? The services and screenings done by your health care provider during your annual well check will depend on your age, overall health, lifestyle risk factors, and family history of disease. Counseling  Your health care provider may ask you questions about your: Alcohol use. Tobacco use. Drug use. Emotional well-being. Home and relationship well-being. Sexual activity. Eating habits. History of falls. Memory and ability to understand (cognition). Work and work Statistician. Reproductive health. Screening  You may have the following tests or measurements: Height, weight, and BMI. Blood pressure. Lipid and cholesterol levels. These may be checked every 5 years, or more frequently if you are over 69 years old. Skin check. Lung cancer screening. You may have this screening every year starting at age 69 if you have a 30-pack-year history of smoking and currently smoke or have quit within the past 15 years. Fecal occult blood test (FOBT) of the stool. You may have this test every year starting at age 69. Flexible sigmoidoscopy or colonoscopy. You may have a sigmoidoscopy every 5 years or a colonoscopy every 10 years starting at age 69. Hepatitis C blood test. Hepatitis B blood test. Sexually  transmitted disease (STD) testing. Diabetes screening. This is done by checking your blood sugar (glucose) after you have not eaten for a while (fasting). You may have this done every 1-3 years. Bone density scan. This is done to screen for osteoporosis. You may have this done starting at age 69. Mammogram. This may be done every 1-2 years. Talk to your health care provider about how often you should  have regular mammograms. Talk with your health care provider about your test results, treatment options, and if necessary, the need for more tests. Vaccines  Your health care provider may recommend certain vaccines, such as: Influenza vaccine. This is recommended every year. Tetanus, diphtheria, and acellular pertussis (Tdap, Td) vaccine. You may need a Td booster every 10 years. Zoster vaccine. You may need this after age 69. Pneumococcal 13-valent conjugate (PCV13) vaccine. One dose is recommended after age 69. Pneumococcal polysaccharide (PPSV23) vaccine. One dose is recommended after age 69. Talk to your health care provider about which screenings and vaccines you need and how often you need them. This information is not intended to replace advice given to you by your health care provider. Make sure you discuss any questions you have with your health care provider. Document Released: 09/14/2015 Document Revised: 05/07/2016 Document Reviewed: 06/19/2015 Elsevier Interactive Patient Education  2017 [place] Prevention in the Home Falls can cause injuries. They can happen to people of all ages. There are many things you can do to make your home safe and to help prevent falls. What can I do on the outside of my home? Regularly fix the edges of walkways and driveways and fix any cracks. Remove anything that might make you trip as you walk through a door, such as a raised step or threshold. Trim any bushes or trees on the path to your home. Use bright outdoor lighting. Clear any walking paths of anything that might make someone trip, such as rocks or tools. Regularly check to see if handrails are loose or broken. Make sure that both sides of any steps have handrails. Any raised decks and porches should have guardrails on the edges. Have any leaves, snow, or ice cleared regularly. Use sand or salt on walking paths during winter. Clean up any spills in your garage right away. This  includes oil or grease spills. What can I do in the bathroom? Use night lights. Install grab bars by the toilet and in the tub and shower. Do not use towel bars as grab bars. Use non-skid mats or decals in the tub or shower. If you need to sit down in the shower, use a plastic, non-slip stool. Keep the floor dry. Clean up any water that spills on the floor as soon as it happens. Remove soap buildup in the tub or shower regularly. Attach bath mats securely with double-sided non-slip rug tape. Do not have throw rugs and other things on the floor that can make you trip. What can I do in the bedroom? Use night lights. Make sure that you have a light by your bed that is easy to reach. Do not use any sheets or blankets that are too big for your bed. They should not hang down onto the floor. Have a firm chair that has side arms. You can use this for support while you get dressed. Do not have throw rugs and other things on the floor that can make you trip. What can I do in the kitchen? Clean up any spills right away. Avoid  walking on wet floors. Keep items that you use a lot in easy-to-reach places. If you need to reach something above you, use a strong step stool that has a grab bar. Keep electrical cords out of the way. Do not use floor polish or wax that makes floors slippery. If you must use wax, use non-skid floor wax. Do not have throw rugs and other things on the floor that can make you trip. What can I do with my stairs? Do not leave any items on the stairs. Make sure that there are handrails on both sides of the stairs and use them. Fix handrails that are broken or loose. Make sure that handrails are as long as the stairways. Check any carpeting to make sure that it is firmly attached to the stairs. Fix any carpet that is loose or worn. Avoid having throw rugs at the top or bottom of the stairs. If you do have throw rugs, attach them to the floor with carpet tape. Make sure that you  have a light switch at the top of the stairs and the bottom of the stairs. If you do not have them, ask someone to add them for you. What else can I do to help prevent falls? Wear shoes that: Do not have high heels. Have rubber bottoms. Are comfortable and fit you well. Are closed at the toe. Do not wear sandals. If you use a stepladder: Make sure that it is fully opened. Do not climb a closed stepladder. Make sure that both sides of the stepladder are locked into place. Ask someone to hold it for you, if possible. Clearly mark and make sure that you can see: Any grab bars or handrails. First and last steps. Where the edge of each step is. Use tools that help you move around (mobility aids) if they are needed. These include: Canes. Walkers. Scooters. Crutches. Turn on the lights when you go into a dark area. Replace any light bulbs as soon as they burn out. Set up your furniture so you have a clear path. Avoid moving your furniture around. If any of your floors are uneven, fix them. If there are any pets around you, be aware of where they are. Review your medicines with your doctor. Some medicines can make you feel dizzy. This can increase your chance of falling. Ask your doctor what other things that you can do to help prevent falls. This information is not intended to replace advice given to you by your health care provider. Make sure you discuss any questions you have with your health care provider. Document Released: 06/14/2009 Document Revised: 01/24/2016 Document Reviewed: 09/22/2014 Elsevier Interactive Patient Education  2017 [employer].

## 2022-11-12 NOTE — Progress Notes (Signed)
I connected with  [name] on 11/12/22 by a audio enabled telemedicine application and verified that I am speaking with the correct person using two identifiers.  Patient Location: Home  Provider Location: Office/Clinic  I discussed the limitations of evaluation and management by telemedicine. The patient expressed understanding and agreed to proceed.  Subjective:   Tiffany Bonilla is a 67 y.o. female who presents for Medicare Annual (Subsequent) preventive examination.  Review of Systems     Cardiac Risk Factors include: advanced age (>74mn, >>32women);dyslipidemia;hypertension;smoking/ tobacco exposure     Objective:    There were no vitals filed for this visit. There is no height or weight on file to calculate BMI.     11/12/2022   11:11 AM 01/10/2022    6:18 PM 10/25/2021    9:35 AM 08/21/2021    2:14 PM 05/23/2021   11:22 AM 02/20/2021    8:31 AM 02/06/2021   11:03 AM  Advanced Directives  Does Patient Have a Medical Advance Directive? No No No Yes No No No  Type of AScientist, research (medical)Living will     Copy of HHookerin Chart?    No - copy requested     Would patient like information on creating a medical advance directive? No - Patient declined  No - Patient declined   No - Patient declined No - Patient declined    Current Medications (verified) Outpatient Encounter Medications as of 11/12/2022  Medication Sig   albuterol (PROVENTIL) (2.5 MG/3ML) 0.083% nebulizer solution Take 3 mLs (2.5 mg total) by nebulization every 6 (six) hours as needed for wheezing or shortness of breath.   albuterol (VENTOLIN HFA) 108 (90 Base) MCG/ACT inhaler Inhale 2 puffs into the lungs every 6 (six) hours as needed for wheezing or shortness of breath.   aspirin EC 81 MG tablet Take 1 tablet by mouth daily.   cholecalciferol (VITAMIN D3) 25 MCG (1000 UNIT) tablet Take 2,000 Units by mouth daily.   clobetasol cream (TEMOVATE)  0.05 % APPLY TOPICALLY TO THE AFFECTED AREA TWICE DAILY   escitalopram (LEXAPRO) 20 MG tablet TAKE 1 TABLET(20 MG) BY MOUTH DAILY   Evolocumab (REPATHA) 140 MG/ML SOSY Inject 140 mg into the skin every 14 (fourteen) days.   gabapentin (NEURONTIN) 100 MG capsule TAKE 2 CAPSULES(200 MG) BY MOUTH TWICE DAILY   lisinopril (ZESTRIL) 10 MG tablet TAKE 1 TABLET(10 MG) BY MOUTH DAILY   Melatonin 10 MG TABS Take 1 tablet by mouth at bedtime.   Multiple Vitamins-Minerals (ALIVE WOMENS ENERGY) TABS Take by mouth.   rosuvastatin (CRESTOR) 40 MG tablet TAKE 1 TABLET(40 MG) BY MOUTH DAILY   Vitamin D, Ergocalciferol, (DRISDOL) 1.25 MG (50000 UNIT) CAPS capsule Take 1 capsule (50,000 Units total) by mouth every 7 (seven) days.   clopidogrel (PLAVIX) 75 MG tablet Take 75 mg by mouth daily. (Patient not taking: Reported on 11/12/2022)   nicotine (NICODERM CQ - DOSED IN MG/24 HR) 7 mg/24hr patch Place 7 mg onto the skin daily. (Patient not taking: Reported on 11/12/2022)   [DISCONTINUED] albuterol (VENTOLIN HFA) 108 (90 Base) MCG/ACT inhaler Inhale 1-2 puffs into the lungs every 6 (six) hours as needed.   No facility-administered encounter medications on file as of 11/12/2022.    Allergies (verified) Morphine, Scopolamine, Codeine, and Sulfa antibiotics   History: Past Medical History:  Diagnosis Date   Allergy    Asthma    COVID 07/07/2022   Hyperlipidemia  Hypertension    Pre-diabetes 11/24/2018   Spina bifida occulta    Stroke [facility])    SVT (supraventricular tachycardia) 11/24/2018   S/p ablation at [facility]   Wears dentures    full upper   Past Surgical History:  Procedure Laterality Date   CARDIAC ELECTROPHYSIOLOGY STUDY AND ABLATION  2006   for SVT   CATARACT EXTRACTION W/PHACO Right 02/06/2021   Procedure: CATARACT EXTRACTION PHACO AND INTRAOCULAR LENS PLACEMENT ([place]) RIGHT;  Surgeon: Leandrew Koyanagi, MD;  Location: [place];  Service: Ophthalmology;  Laterality: Right;   2.85 0:32.6 8.7%   CATARACT EXTRACTION W/PHACO Left 02/20/2021   Procedure: CATARACT EXTRACTION PHACO AND INTRAOCULAR LENS PLACEMENT ([place]) LEFT;  Surgeon: Leandrew Koyanagi, MD;  Location: [place];  Service: Ophthalmology;  Laterality: Left;  3.12 0:39.5   COLONOSCOPY  2006   COLONOSCOPY WITH PROPOFOL N/A 10/25/2021   Procedure: COLONOSCOPY WITH BIOPSY;  Surgeon: [name], MD;  Location: [place];  Service: Endoscopy;  Laterality: N/A;   CORONARY ANGIOPLASTY WITH STENT PLACEMENT  2001   to Circumfx   ESOPHAGOGASTRODUODENOSCOPY  2017   LAPAROSCOPIC CHOLECYSTECTOMY  04/25/2019   @ [place] RESECTION  11/07/2015   POLYPECTOMY N/A 10/25/2021   Procedure: POLYPECTOMY;  Surgeon: [name], MD;  Location: [place];  Service: Endoscopy;  Laterality: N/A;   TOTAL ABDOMINAL HYSTERECTOMY  1992   CIS cervix   Family History  Problem Relation Age of Onset   Aneurysm Mother    Breast cancer Maternal Aunt    Social History   Socioeconomic History   Marital status: Widowed    Spouse name: Not on file   Number of children: 5   Years of education: Not on file   Highest education level: Not on file  Occupational History   Not on file  Tobacco Use   Smoking status: Some Days    Packs/day: 0.50    Years: 40.00    Total pack years: 20.00    Types: Cigarettes    Start date: 69   Smokeless tobacco: Never  Vaping Use   Vaping Use: Never used  Substance and Sexual Activity   Alcohol use: Not Currently    Comment: rare   Drug use: Never   Sexual activity: Not Currently  Other Topics Concern   Not on file  Social History Narrative   Pt lives with 2 of her daughters and 2 grandchildren   Social Determinants of Health   Financial Resource Strain: Low Risk  (11/12/2022)   Overall Financial Resource Strain (CARDIA)    Difficulty of Paying Living Expenses: Not very hard  Food Insecurity: No Food Insecurity (11/12/2022)    Hunger Vital Sign    Worried About Running Out of Food in the Last Year: Never true    [place] in the Last Year: Never true  Transportation Needs: No Transportation Needs (11/12/2022)   PRAPARE - Hydrologist (Medical): No    Lack of Transportation (Non-Medical): No  Physical Activity: Insufficiently Active (11/12/2022)   Exercise Vital Sign    Days of Exercise per Week: 2 days    Minutes of Exercise per Session: 20 min  Stress: No Stress Concern Present (11/12/2022)   [place]    Feeling of Stress : Only a little  Social Connections: Moderately Isolated (11/12/2022)   Social Connection and Isolation Panel [NHANES]    Frequency of  Communication with Friends and Family: More than three times a week    Frequency of Social Gatherings with Friends and Family: More than three times a week    Attends Religious Services: More than 4 times per year    Active Member of [employer] or Organizations: No    Attends Archivist Meetings: Never    Marital Status: Widowed    Tobacco Counseling Ready to quit: Not Answered Counseling given: Not Answered   Clinical Intake:  Pre-visit preparation completed: Yes  Pain : No/denies pain     Nutritional Risks: None Diabetes: No  How often do you need to have someone help you when you read instructions, pamphlets, or other written materials from your doctor or pharmacy?: 1 - Never  Diabetic?no  Interpreter Needed?: No  Information entered by :: Kirke Shaggy, LPN   Activities of Daily Living    11/12/2022   11:12 AM  In your present state of health, do you have any difficulty performing the following activities:  Hearing? 0  Vision? 0  Difficulty concentrating or making decisions? 1  Walking or climbing stairs? 0  Dressing or bathing? 0  Doing errands, shopping? 0  Preparing Food and eating ? N  Using the Toilet? N  In the past  six months, have you accidently leaked urine? N  Do you have problems with loss of bowel control? N  Managing your Medications? N  Managing your Finances? N  Housekeeping or managing your Housekeeping? N    Patient Care Team: Glean Hess, MD as PCP - General (Internal Medicine)  Indicate any recent Medical Services you may have received from other than [facility] providers in the past year (date may be approximate).     Assessment:   This is a routine wellness examination for [place].  Hearing/Vision screen Hearing Screening - Comments:: No aids Vision Screening - Comments:: Wears glasses- [place] [employer] [place]  Dietary issues and exercise activities discussed: Current Exercise Habits: Home exercise routine, Type of exercise: walking, Time (Minutes): 20, Frequency (Times/Week): 2, Weekly Exercise (Minutes/Week): 40, Intensity: Mild   Goals Addressed             This Visit's Progress    DIET - EAT MORE FRUITS AND VEGETABLES         Depression Screen    11/12/2022   11:09 AM 09/22/2022    2:05 PM 06/10/2022   10:00 AM 04/24/2022    8:32 AM 08/21/2021    2:13 PM 06/19/2021    1:42 PM 04/18/2021    9:53 AM  PHQ 2/9 Scores  PHQ - 2 Score 1 0 0 0 0 0 0  PHQ- 9 Score 3 0 4 0  0 1    Fall Risk    11/12/2022   11:12 AM 09/22/2022    2:06 PM 06/10/2022   10:01 AM 04/24/2022    8:32 AM 08/21/2021    2:16 PM  Fall Risk   Falls in the past year? 0 0 0 0 1  Number falls in past yr: 0 0 0 0 1  Injury with Fall? 0 0 0 0 1  Risk for fall due to : No Fall Risks No Fall Risks No Fall Risks No Fall Risks History of fall(s)  Follow up Falls prevention discussed;Falls evaluation completed Falls evaluation completed Falls evaluation completed Falls evaluation completed Falls prevention discussed    FALL RISK PREVENTION PERTAINING TO THE HOME:  Any stairs in or around the home? Yes  If  so, are there any without handrails? No  Home free of loose throw rugs in walkways, pet beds,  electrical cords, etc? Yes  Adequate lighting in your home to reduce risk of falls? Yes   ASSISTIVE DEVICES UTILIZED TO PREVENT FALLS:  Life alert? No  Use of a cane, walker or w/c? No  Grab bars in the bathroom? No  Shower chair or bench in shower? No  Elevated toilet seat or a handicapped toilet? No     Cognitive Function:        11/12/2022   11:17 AM  6CIT Screen  What Year? 0 points  What month? 0 points  What time? 0 points  Count back from 20 0 points  Months in reverse 2 points  Repeat phrase 2 points  Total Score 4 points    Immunizations Immunization History  Administered Date(s) Administered   Fluad Quad(high Dose 65+) 10/29/2020, 09/18/2022   Influenza,inj,Quad PF,6+ Mos 07/13/2019   Influenza-Unspecified 06/28/2013, 06/18/2015   Moderna Sars-Covid-2 Vaccination 09/14/2019, 10/24/2019, 10/02/2020   Pfizer Covid-19 Vaccine Bivalent Booster 85yr & up 05/28/2021   Pneumococcal Conjugate-13 10/29/2020   Pneumococcal Polysaccharide-23 06/29/2015, 11/25/2018   Pneumococcal-Unspecified 01/13/2011   Tdap 10/31/2010   Zoster Recombinat (Shingrix) 04/18/2021    TDAP status: Due, Education has been provided regarding the importance of this vaccine. Advised may receive this vaccine at local pharmacy or Health Dept. Aware to provide a copy of the vaccination record if obtained from local pharmacy or Health Dept. Verbalized acceptance and understanding.  Flu Vaccine status: Up to date  Pneumococcal vaccine status: Up to date  Covid-19 vaccine status: Completed vaccines  Qualifies for Shingles Vaccine? Yes   Zostavax completed No   Shingrix Completed?: No.    Education has been provided regarding the importance of this vaccine. Patient has been advised to call insurance company to determine out of pocket expense if they have not yet received this vaccine. Advised may also receive vaccine at local pharmacy or Health Dept. Verbalized acceptance and  understanding.  Screening Tests Health Maintenance  Topic Date Due   DTaP/Tdap/Td (2 - Td or Tdap) 10/30/2020   Zoster Vaccines- Shingrix (2 of 2) 06/13/2021   Lung Cancer Screening  09/14/2021   COVID-19 Vaccine (5 - 2023-24 season) 05/02/2022   MAMMOGRAM  06/26/2023   Medicare Annual Wellness (AWV)  11/12/2023   Pneumonia Vaccine 69 Years old (3 of 3 - PPSV23 or PCV20) 11/25/2023   COLONOSCOPY (Pts 45-437yrInsurance coverage will need to be confirmed)  10/25/2024   INFLUENZA VACCINE  Completed   DEXA SCAN  Completed   Hepatitis C Screening  Completed   HPV VACCINES  Aged Out    Health Maintenance  Health Maintenance Due  Topic Date Due   DTaP/Tdap/Td (2 - Td or Tdap) 10/30/2020   Zoster Vaccines- Shingrix (2 of 2) 06/13/2021   Lung Cancer Screening  09/14/2021   COVID-19 Vaccine (5 - 2023-24 season) 05/02/2022    Colorectal cancer screening: Type of screening: Colonoscopy. Completed 10/25/21. Repeat every 3 years  Mammogram status: Completed 06/25/22. Repeat every year  Bone Density status: Completed 05/01/21. Results reflect: Bone density results: OSTEOPENIA. Repeat every 5 years.  Lung Cancer Screening: (Low Dose CT Chest recommended if Age 69-80ears, 30 pack-year currently smoking OR have quit w/in 15years.) does qualify.   Lung Cancer Screening Referral: referral sent today  Additional Screening:  Hepatitis C Screening: does qualify; Completed 10/25/15  Vision Screening: Recommended annual ophthalmology exams for early detection of  glaucoma and other disorders of the eye. Is the patient up to date with their annual eye exam?  Yes  Who is the provider or what is the name of the office in which the patient attends annual eye exams? [place] If pt is not established with a provider, would they like to be referred to a provider to establish care? No .   Dental Screening: Recommended annual dental exams for proper oral hygiene  Community Resource Referral /  Chronic Care Management: CRR required this visit?  No   CCM required this visit?  No      Plan:     I have personally reviewed and noted the following in the patient's chart:   Medical and social history Use of alcohol, tobacco or illicit drugs  Current medications and supplements including opioid prescriptions. Patient is not currently taking opioid prescriptions. Functional ability and status Nutritional status Physical activity Advanced directives List of other physicians Hospitalizations, surgeries, and ER visits in previous 12 months Vitals Screenings to include cognitive, depression, and falls Referrals and appointments  In addition, I have reviewed and discussed with patient certain preventive protocols, quality metrics, and best practice recommendations. A written personalized care plan for preventive services as well as general preventive health recommendations were provided to patient.     Dionisio David, LPN   [id number]   Nurse Notes: none

## 2022-11-17 ENCOUNTER — Encounter: Payer: Self-pay | Admitting: Internal Medicine

## 2022-12-01 ENCOUNTER — Ambulatory Visit (INDEPENDENT_AMBULATORY_CARE_PROVIDER_SITE_OTHER): Payer: Medicare HMO | Admitting: Internal Medicine

## 2022-12-01 ENCOUNTER — Encounter: Payer: Self-pay | Admitting: Internal Medicine

## 2022-12-01 VITALS — BP 124/66 | HR 77 | Ht 61.0 in | Wt 151.0 lb

## 2022-12-01 DIAGNOSIS — F17200 Nicotine dependence, unspecified, uncomplicated: Secondary | ICD-10-CM

## 2022-12-01 DIAGNOSIS — E782 Mixed hyperlipidemia: Secondary | ICD-10-CM

## 2022-12-01 DIAGNOSIS — I10 Essential (primary) hypertension: Secondary | ICD-10-CM | POA: Diagnosis not present

## 2022-12-01 DIAGNOSIS — G250 Essential tremor: Secondary | ICD-10-CM | POA: Diagnosis not present

## 2022-12-01 DIAGNOSIS — Z8673 Personal history of transient ischemic attack (TIA), and cerebral infarction without residual deficits: Secondary | ICD-10-CM

## 2022-12-01 NOTE — Assessment & Plan Note (Signed)
Little benefit from gabapentin so weaning down Currently taking it at night

## 2022-12-01 NOTE — Assessment & Plan Note (Signed)
Clinically stable exam with well controlled BP on lisinopril. Tolerating medications without side effects. Pt to continue current regimen and low sodium diet.  

## 2022-12-01 NOTE — Assessment & Plan Note (Addendum)
Tolerating statin medications and Repatha without concerns LDL is  Lab Results  Component Value Date   LDLCALC 41 09/14/2022   with a goal of < 70.

## 2022-12-01 NOTE — Progress Notes (Signed)
Date:  12/01/2022   Name:  Tiffany Bonilla   DOB:  1956-07-09   MRN:  [id number]   Chief Complaint: Hypertension  Hypertension This is a chronic problem. The problem is controlled. Pertinent negatives include no chest pain, headaches, palpitations or shortness of breath. Past treatments include ACE inhibitors. The current treatment provides significant improvement. Hypertensive end-organ damage includes CAD/MI. There is no history of kidney disease or CVA.  Hyperlipidemia This is a chronic problem. The problem is uncontrolled. Recent lipid tests were reviewed and are high. Pertinent negatives include no chest pain or shortness of breath. Current antihyperlipidemic treatment includes statins (Repatha started last year). Improvement on treatment: no follow up labs done.   Hospitalized in January with Carotid artery thrombosis - treated with thromboendarterectomy on left.  No recurrent neurological issues.  Essential tremor - treated by neurology with gabapentin.  It has not made much difference to the tremor but does help her sleep so she is only taking it at bedtime. The tremor is worse at times and is not interfering with work or ADLs.  Lab Results  Component Value Date   NA 145 (H) 04/24/2022   K 4.2 04/24/2022   CO2 20 04/24/2022   GLUCOSE 85 04/24/2022   BUN 10 04/24/2022   CREATININE 0.96 04/24/2022   CALCIUM 8.9 04/24/2022   EGFR 65 04/24/2022   GFRNONAA 50 (L) 04/09/2020   Lab Results  Component Value Date   CHOL 105 09/14/2022   HDL 50 09/14/2022   LDLCALC 41 09/14/2022   TRIG 68 09/14/2022   CHOLHDL 3.1 04/24/2022   Lab Results  Component Value Date   TSH 1.980 04/24/2022   Lab Results  Component Value Date   HGBA1C 5.5 02/16/2019   Lab Results  Component Value Date   WBC 8.0 04/24/2022   HGB 14.6 04/24/2022   HCT 43.5 04/24/2022   MCV 93 04/24/2022   PLT 182 04/24/2022   Lab Results  Component Value Date   ALT 16 04/24/2022   AST 22 04/24/2022    ALKPHOS 69 04/24/2022   BILITOT 0.3 04/24/2022   Lab Results  Component Value Date   VD25OH 13.3 (L) 04/24/2022     Review of Systems  Constitutional:  Negative for chills, fatigue and unexpected weight change.  HENT:  Negative for nosebleeds.   Eyes:  Negative for visual disturbance.  Respiratory:  Negative for cough, chest tightness, shortness of breath and wheezing.   Cardiovascular:  Negative for chest pain, palpitations and leg swelling.  Gastrointestinal:  Negative for abdominal pain, constipation and diarrhea.  Neurological:  Positive for tremors. Negative for dizziness, weakness, light-headedness and headaches.  Psychiatric/Behavioral:  Negative for dysphoric mood and sleep disturbance. The patient is not nervous/anxious.     Patient Active Problem List   Diagnosis Date Noted   Chronic ischemic left middle cerebral artery (MCA) stroke 09/22/2022   Carotid artery thrombosis, left 09/13/2022   Centrilobular emphysema 04/24/2022   Colon cancer screening    Polyp of descending colon    Right hip pain 06/19/2021   Chronic midline low back pain without sciatica 06/19/2021   Chronic kidney disease, stage 3b 04/19/2021   Diarrhea 04/07/2021   Elevated troponin 04/07/2021   Postural dizziness with presyncope 04/07/2021   Atrophic kidney 09/27/2020   Aortic aneurysm, abdominal 09/27/2020   Benign essential tremor 04/09/2020   Vitamin D deficiency 09/06/2019   Other specified anxiety disorders [id number]   Lichen sclerosus et atrophicus of the vulva 11/25/2018  Essential hypertension 11/24/2018   Old lacunar stroke without late effect 11/24/2018   Current every day smoker 10/25/2015   Hx of hepatitis C 10/25/2015   CAD in native artery 05/24/2015   Mixed hyperlipidemia 06/12/2011    Allergies  Allergen Reactions   Morphine Hives and Shortness Of Breath    Stopped breathing    Scopolamine Other (See Comments)    Unconscious    Codeine Nausea Only and Other (See  Comments)    NAUSEA/VOMITING    Sulfa Antibiotics Hives and Nausea Only    Other Reaction: Not Assessed     Past Surgical History:  Procedure Laterality Date   CARDIAC ELECTROPHYSIOLOGY STUDY AND ABLATION  2006   for SVT   CATARACT EXTRACTION W/PHACO Right 02/06/2021   Procedure: CATARACT EXTRACTION PHACO AND INTRAOCULAR LENS PLACEMENT ([place]) RIGHT;  Surgeon: Leandrew Koyanagi, MD;  Location: [place];  Service: Ophthalmology;  Laterality: Right;  2.85 0:32.6 8.7%   CATARACT EXTRACTION W/PHACO Left 02/20/2021   Procedure: CATARACT EXTRACTION PHACO AND INTRAOCULAR LENS PLACEMENT ([place]) LEFT;  Surgeon: Leandrew Koyanagi, MD;  Location: [place];  Service: Ophthalmology;  Laterality: Left;  3.12 0:39.5   COLONOSCOPY  2006   COLONOSCOPY WITH PROPOFOL N/A 10/25/2021   Procedure: COLONOSCOPY WITH BIOPSY;  Surgeon: [name], MD;  Location: [place];  Service: Endoscopy;  Laterality: N/A;   CORONARY ANGIOPLASTY WITH STENT PLACEMENT  2001   to Circumfx   ESOPHAGOGASTRODUODENOSCOPY  2017   LAPAROSCOPIC CHOLECYSTECTOMY  04/25/2019   @ [facility] RESECTION  11/07/2015   POLYPECTOMY N/A 10/25/2021   Procedure: POLYPECTOMY;  Surgeon: [name], MD;  Location: [place];  Service: Endoscopy;  Laterality: N/A;   TOTAL ABDOMINAL HYSTERECTOMY  1992   CIS cervix    Social History   Tobacco Use   Smoking status: Former    Packs/day: 0.50    Years: 40.00    Additional pack years: 0.00    Total pack years: 20.00    Types: Cigarettes    Start date: 15    Quit date: 09/13/2022    Years since quitting: 0.2   Smokeless tobacco: Never  Vaping Use   Vaping Use: Never used  Substance Use Topics   Alcohol use: Not Currently    Comment: rare   Drug use: Never     Medication list has been reviewed and updated.  Current Meds  Medication Sig   albuterol (PROVENTIL) (2.5 MG/3ML) 0.083% nebulizer solution Take 3 mLs (2.5  mg total) by nebulization every 6 (six) hours as needed for wheezing or shortness of breath.   albuterol (VENTOLIN HFA) 108 (90 Base) MCG/ACT inhaler Inhale 2 puffs into the lungs every 6 (six) hours as needed for wheezing or shortness of breath.   aspirin EC 81 MG tablet Take 1 tablet by mouth daily.   cholecalciferol (VITAMIN D3) 25 MCG (1000 UNIT) tablet Take 2,000 Units by mouth daily.   clobetasol cream (TEMOVATE) 0.05 % APPLY TOPICALLY TO THE AFFECTED AREA TWICE DAILY   escitalopram (LEXAPRO) 20 MG tablet TAKE 1 TABLET(20 MG) BY MOUTH DAILY   Evolocumab (REPATHA) 140 MG/ML SOSY Inject 140 mg into the skin every 14 (fourteen) days.   gabapentin (NEURONTIN) 100 MG capsule TAKE 2 CAPSULES(200 MG) BY MOUTH TWICE DAILY   lisinopril (ZESTRIL) 10 MG tablet TAKE 1 TABLET(10 MG) BY MOUTH DAILY   Melatonin 10 MG TABS Take 1 tablet by mouth at bedtime.   Multiple Vitamins-Minerals (ALIVE  WOMENS ENERGY) TABS Take by mouth.   rosuvastatin (CRESTOR) 40 MG tablet TAKE 1 TABLET(40 MG) BY MOUTH DAILY   Vitamin D, Ergocalciferol, (DRISDOL) 1.25 MG (50000 UNIT) CAPS capsule Take 1 capsule (50,000 Units total) by mouth every 7 (seven) days.       12/01/2022    2:37 PM 09/22/2022    2:05 PM 06/10/2022   10:01 AM 04/24/2022    8:32 AM  GAD 7 : Generalized Anxiety Score  Nervous, Anxious, on Edge 0 0 0 0  Control/stop worrying 0 0 0 0  Worry too much - different things 0 0 0 0  Trouble relaxing 0 0 0 0  Restless 0 0 0 0  Easily annoyed or irritable 0 0 0 0  Afraid - awful might happen 0 0 0 0  Total GAD 7 Score 0 0 0 0  Anxiety Difficulty Not difficult at all Not difficult at all Not difficult at all Not difficult at all       12/01/2022    2:37 PM 11/12/2022   11:09 AM 09/22/2022    2:05 PM  Depression screen PHQ 2/9  Decreased Interest 0 0 0  Down, Depressed, Hopeless 0 1 0  PHQ - 2 Score 0 1 0  Altered sleeping 0 1 0  Tired, decreased energy 0 1 0  Change in appetite 0 0 0  Feeling bad or  failure about yourself  0 0 0  Trouble concentrating 0 0 0  Moving slowly or fidgety/restless 0 0 0  Suicidal thoughts 0 0 0  PHQ-9 Score 0 3 0  Difficult doing work/chores Not difficult at all Not difficult at all Not difficult at all    BP Readings from Last 3 Encounters:  12/01/22 124/66  09/22/22 120/68  09/11/22 138/72    Physical Exam Vitals and nursing note reviewed.  Constitutional:      General: She is not in acute distress.    Appearance: Normal appearance. She is well-developed.  HENT:     Head: Normocephalic and atraumatic.  Cardiovascular:     Rate and Rhythm: Normal rate and regular rhythm.     Heart sounds: No murmur heard. Pulmonary:     Effort: Pulmonary effort is normal. No respiratory distress.     Breath sounds: No wheezing or rhonchi.  Musculoskeletal:     Cervical back: Normal range of motion.     Right lower leg: No edema.     Left lower leg: No edema.  Skin:    General: Skin is warm and dry.     Findings: No rash.  Neurological:     General: No focal deficit present.     Mental Status: She is alert and oriented to person, place, and time.  Psychiatric:        Mood and Affect: Mood normal.        Behavior: Behavior normal.     Wt Readings from Last 3 Encounters:  12/01/22 151 lb (68.5 kg)  11/12/22 146 lb (66.2 kg)  09/22/22 146 lb (66.2 kg)    BP 124/66   Pulse 77   Ht 5\' 1"  (1.549 m)   Wt 151 lb (68.5 kg)   SpO2 95%   BMI 28.53 kg/m   Assessment and Plan:  Problem List Items Addressed This Visit       Cardiovascular and Mediastinum   Chronic ischemic left middle cerebral artery (MCA) stroke    Doing well.  Had endarterectomy in January. On Crestor 40  mg and Repatha      Essential hypertension - Primary (Chronic)    Clinically stable exam with well controlled BP on lisinopril. Tolerating medications without side effects. Pt to continue current regimen and low sodium diet.         Nervous and Auditory   Benign  essential tremor (Chronic)    Little benefit from gabapentin so weaning down Currently taking it at night        Other   Current every day smoker (Chronic)   Mixed hyperlipidemia (Chronic)    Tolerating statin medications and Repatha without concerns LDL is  Lab Results  Component Value Date   LDLCALC 41 09/14/2022  with a goal of < 70.          Return in about 6 months (around 06/02/2023) for CPX.   Partially dictated using [place], any errors are not intentional.  Glean Hess, MD [place], [place]

## 2022-12-01 NOTE — Assessment & Plan Note (Signed)
Doing well.  Had endarterectomy in January. On Crestor 40 mg and Repatha

## 2023-01-14 DIAGNOSIS — R55 Syncope and collapse: Secondary | ICD-10-CM | POA: Diagnosis not present

## 2023-02-02 ENCOUNTER — Other Ambulatory Visit: Payer: Self-pay | Admitting: Internal Medicine

## 2023-02-02 DIAGNOSIS — E782 Mixed hyperlipidemia: Secondary | ICD-10-CM

## 2023-02-24 ENCOUNTER — Ambulatory Visit: Payer: Self-pay

## 2023-02-24 ENCOUNTER — Ambulatory Visit (INDEPENDENT_AMBULATORY_CARE_PROVIDER_SITE_OTHER): Payer: Medicare HMO | Admitting: Internal Medicine

## 2023-02-24 ENCOUNTER — Encounter: Payer: Self-pay | Admitting: Internal Medicine

## 2023-02-24 VITALS — BP 122/78 | HR 78 | Temp 98.3°F | Ht 61.0 in | Wt 157.0 lb

## 2023-02-24 DIAGNOSIS — J432 Centrilobular emphysema: Secondary | ICD-10-CM

## 2023-02-24 DIAGNOSIS — J069 Acute upper respiratory infection, unspecified: Secondary | ICD-10-CM

## 2023-02-24 MED ORDER — AZITHROMYCIN 250 MG PO TABS
ORAL_TABLET | ORAL | 0 refills | Status: AC
Start: 2023-02-24 — End: 2023-03-01

## 2023-02-24 MED ORDER — PROMETHAZINE-DM 6.25-15 MG/5ML PO SYRP
5.0000 mL | ORAL_SOLUTION | Freq: Four times a day (QID) | ORAL | 0 refills | Status: AC | PRN
Start: 2023-02-24 — End: 2023-03-05

## 2023-02-24 MED ORDER — ALBUTEROL SULFATE HFA 108 (90 BASE) MCG/ACT IN AERS: 2.0000 | INHALATION_SPRAY | Freq: Four times a day (QID) | RESPIRATORY_TRACT | 1 refills | Status: DC | PRN

## 2023-02-24 NOTE — Progress Notes (Signed)
Date:  02/24/2023   Name:  Tiffany Bonilla   DOB:  [birth year]   MRN:  [id number]   Chief Complaint: Cough (Cough, fever 99.8, body aches, and shortness of breath. Started yesterday. Negative Covid test yesterday. 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 )  Cough This is a new problem. The current episode started yesterday. The problem has been gradually worsening. The problem occurs every few minutes. The cough is Productive of sputum. Associated symptoms include a fever, myalgias and wheezing. Pertinent negatives include no chest pain, chills, headaches or shortness of breath. Risk factors for lung disease include occupational exposure.    Lab Results  Component Value Date   NA 145 (H) 04/24/2022   K 4.2 04/24/2022   CO2 20 04/24/2022   GLUCOSE 85 04/24/2022   BUN 10 04/24/2022   CREATININE 0.96 04/24/2022   CALCIUM 8.9 04/24/2022   EGFR 65 04/24/2022   GFRNONAA 50 (L) 04/09/2020   Lab Results  Component Value Date   CHOL 105 09/14/2022   HDL 50 09/14/2022   LDLCALC 41 09/14/2022   TRIG 68 09/14/2022   CHOLHDL 3.1 04/24/2022   Lab Results  Component Value Date   TSH 1.980 04/24/2022   Lab Results  Component Value Date   HGBA1C 5.5 02/16/2019   Lab Results  Component Value Date   WBC 8.0 04/24/2022   HGB 14.6 04/24/2022   HCT 43.5 04/24/2022   MCV 93 04/24/2022   PLT 182 04/24/2022   Lab Results  Component Value Date   ALT 16 04/24/2022   AST 22 04/24/2022   ALKPHOS 69 04/24/2022   BILITOT 0.3 04/24/2022   Lab Results  Component Value Date   VD25OH 13.3 (L) 04/24/2022     Review of Systems  Constitutional:  Positive for fatigue and fever. Negative for chills.  Respiratory:  Positive for cough, chest tightness and wheezing. Negative for shortness of breath.   Cardiovascular:  Negative for chest pain and palpitations.  Musculoskeletal:  Positive for myalgias.  Neurological:  Negative for dizziness, light-headedness and  headaches.  Psychiatric/Behavioral:  Negative for dysphoric mood and sleep disturbance. The patient is not nervous/anxious.     Patient Active Problem List   Diagnosis Date Noted   Chronic ischemic left middle cerebral artery (MCA) stroke 09/22/2022   Carotid artery thrombosis, left 09/13/2022   Centrilobular emphysema (HCC) 04/24/2022   Polyp of descending colon    Chronic midline low back pain without sciatica 06/19/2021   Chronic kidney disease, stage 3b (HCC) 04/19/2021   Atrophic kidney 09/27/2020   Aortic aneurysm, abdominal (HCC) 09/27/2020   Benign essential tremor 04/09/2020   Vitamin D deficiency 09/06/2019   Other specified anxiety disorders 02/16/2019   Lichen sclerosus et atrophicus of the vulva 11/25/2018   Essential hypertension 11/24/2018   Old lacunar stroke without late effect 11/24/2018   Tobacco abuse, in remission 10/25/2015   Hx of hepatitis C 10/25/2015   CAD in native artery 05/24/2015   Mixed hyperlipidemia 06/12/2011    Allergies  Allergen Reactions   Morphine Hives and Shortness Of Breath    Stopped breathing    Scopolamine Other (See Comments)    Unconscious    Codeine Nausea Only and Other (See Comments)    NAUSEA/VOMITING    Sulfa Antibiotics Hives and Nausea Only    Other Reaction: Not Assessed     Past Surgical History:  Procedure Laterality Date   CARDIAC ELECTROPHYSIOLOGY STUDY AND ABLATION  2006   for SVT   CATARACT EXTRACTION W/PHACO Right 02/06/2021   Procedure: CATARACT EXTRACTION PHACO AND INTRAOCULAR LENS PLACEMENT ([facility]) RIGHT;  Surgeon: Lockie Mola, MD;  Location: [facility];  Service: Ophthalmology;  Laterality: Right;  2.85 0:32.6 8.7%   CATARACT EXTRACTION W/PHACO Left 02/20/2021   Procedure: CATARACT EXTRACTION PHACO AND INTRAOCULAR LENS PLACEMENT ([facility]) LEFT;  Surgeon: Lockie Mola, MD;  [facility];  Service: Ophthalmology;  Laterality: Left;  3.12 0:39.5   COLONOSCOPY  2006    COLONOSCOPY WITH PROPOFOL N/A 10/25/2021   Procedure: COLONOSCOPY WITH BIOPSY;  Surgeon: Midge Minium, MD;  Location: [facility];  Service: Endoscopy;  Laterality: N/A;   CORONARY ANGIOPLASTY WITH STENT PLACEMENT  2001   to Circumfx   ESOPHAGOGASTRODUODENOSCOPY  2017   LAPAROSCOPIC CHOLECYSTECTOMY  04/25/2019   @ [facility]   LAPAROSCOPIC GASTRIC SLEEVE RESECTION  11/07/2015   POLYPECTOMY N/A 10/25/2021   Procedure: POLYPECTOMY;  Surgeon: Midge Minium, MD;  Location: [facility];  Service: Endoscopy;  Laterality: N/A;   TOTAL ABDOMINAL HYSTERECTOMY  1992   CIS cervix    Social History   Tobacco Use   Smoking status: Former    Packs/day: 0.50    Years: 40.00    Additional pack years: 0.00    Total pack years: 20.00    Types: Cigarettes    Start date: 36    Quit date: 09/13/2022    Years since quitting: 0.4   Smokeless tobacco: Never  Vaping Use   Vaping Use: Never used  Substance Use Topics   Alcohol use: Not Currently    Comment: rare   Drug use: Never     Medication list has been reviewed and updated.  Current Meds  Medication Sig   albuterol (PROVENTIL) (2.5 MG/3ML) 0.083% nebulizer solution Take 3 mLs (2.5 mg total) by nebulization every 6 (six) hours as needed for wheezing or shortness of breath.   aspirin EC 81 MG tablet Take 1 tablet by mouth daily.   azithromycin (ZITHROMAX Z-PAK) 250 MG tablet UAD   cholecalciferol (VITAMIN D3) 25 MCG (1000 UNIT) tablet Take 2,000 Units by mouth daily.   clobetasol cream (TEMOVATE) 0.05 % APPLY TOPICALLY TO THE AFFECTED AREA TWICE DAILY   escitalopram (LEXAPRO) 20 MG tablet TAKE 1 TABLET(20 MG) BY MOUTH DAILY   Evolocumab (REPATHA) 140 MG/ML SOSY Inject 140 mg into the skin every 14 (fourteen) days.   gabapentin (NEURONTIN) 100 MG capsule TAKE 2 CAPSULES(200 MG) BY MOUTH TWICE DAILY   lisinopril (ZESTRIL) 10 MG tablet TAKE 1 TABLET(10 MG) BY MOUTH DAILY   Melatonin 10 MG TABS Take 1 tablet by mouth at bedtime.    Multiple Vitamins-Minerals (ALIVE WOMENS ENERGY) TABS Take by mouth.   promethazine-dextromethorphan (PROMETHAZINE-DM) 6.25-15 MG/5ML syrup Take 5 mLs by mouth 4 (four) times daily as needed for up to 9 days for cough.   rosuvastatin (CRESTOR) 40 MG tablet TAKE 1 TABLET(40 MG) BY MOUTH DAILY   Vitamin D, Ergocalciferol, (DRISDOL) 1.25 MG (50000 UNIT) CAPS capsule Take 1 capsule (50,000 Units total) by mouth every 7 (seven) days.   [DISCONTINUED] albuterol (VENTOLIN HFA) 108 (90 Base) MCG/ACT inhaler Inhale 2 puffs into the lungs every 6 (six) hours as needed for wheezing or shortness of breath.       02/24/2023    4:25 PM 12/01/2022    2:37 PM 09/22/2022    2:05 PM 06/10/2022   10:01 AM  GAD 7 : Generalized Anxiety Score  Nervous, Anxious,  on Edge 0 0 0 0  Control/stop worrying 0 0 0 0  Worry too much - different things 0 0 0 0  Trouble relaxing 0 0 0 0  Restless 0 0 0 0  Easily annoyed or irritable 0 0 0 0  Afraid - awful might happen 0 0 0 0  Total GAD 7 Score 0 0 0 0  Anxiety Difficulty Not difficult at all Not difficult at all Not difficult at all Not difficult at all       02/24/2023    4:25 PM 12/01/2022    2:37 PM 11/12/2022   11:09 AM  Depression screen PHQ 2/9  Decreased Interest 0 0 0  Down, Depressed, Hopeless 0 0 1  PHQ - 2 Score 0 0 1  Altered sleeping 0 0 1  Tired, decreased energy 0 0 1  Change in appetite 0 0 0  Feeling bad or failure about yourself  0 0 0  Trouble concentrating 0 0 0  Moving slowly or fidgety/restless 0 0 0  Suicidal thoughts 0 0 0  PHQ-9 Score 0 0 3  Difficult doing work/chores Not difficult at all Not difficult at all Not difficult at all    BP Readings from Last 3 Encounters:  02/24/23 122/78  12/01/22 124/66  09/22/22 120/68    Physical Exam Vitals and nursing note reviewed.  Constitutional:      General: She is not in acute distress.    Appearance: She is well-developed.  HENT:     Head: Normocephalic and atraumatic.   Cardiovascular:     Rate and Rhythm: Normal rate and regular rhythm.  Pulmonary:     Effort: Pulmonary effort is normal. No respiratory distress.     Breath sounds: Wheezing present. No rhonchi.  Musculoskeletal:     Cervical back: Normal range of motion.  Skin:    General: Skin is warm and dry.     Capillary Refill: Capillary refill takes less than 2 seconds.     Findings: No rash.  Neurological:     Mental Status: She is alert and oriented to person, place, and time.  Psychiatric:        Mood and Affect: Mood normal.        Behavior: Behavior normal.     Wt Readings from Last 3 Encounters:  02/24/23 157 lb (71.2 kg)  12/01/22 151 lb (68.5 kg)  11/12/22 146 lb (66.2 kg)    BP 122/78   Pulse 78   Temp 98.3 F (36.8 C) (Oral)   Ht 5\' 1"  (1.549 m)   Wt 157 lb (71.2 kg)   SpO2 95%   BMI 29.66 kg/m   Assessment and Plan:  Problem List Items Addressed This Visit     Centrilobular emphysema (HCC)   Relevant Medications   albuterol (VENTOLIN HFA) 108 (90 Base) MCG/ACT inhaler   azithromycin (ZITHROMAX Z-PAK) 250 MG tablet   promethazine-dextromethorphan (PROMETHAZINE-DM) 6.25-15 MG/5ML syrup   Other Visit Diagnoses     Upper respiratory tract infection, unspecified type    -  Primary   continue tylenol and fluids and rest out of work for 2 days return if needed   Relevant Medications   azithromycin (ZITHROMAX Z-PAK) 250 MG tablet   promethazine-dextromethorphan (PROMETHAZINE-DM) 6.25-15 MG/5ML syrup       No follow-ups on file.   Partially dictated using Dragon software, any errors are not intentional.  Reubin Milan, MD [facility] [place], [place]

## 2023-02-24 NOTE — Telephone Encounter (Signed)
  Chief Complaint: cough Symptoms: wheezing and cough, bosy aches and productive cough with with phlehm, SOB with exertion, fever to 98.9 (normally 97 degrees)  Frequency: yesterday Pertinent Negatives: Patient denies chest pain, dizziness Disposition: [] ED /[] Urgent Care (no appt availability in office) / [x] Appointment(In office/virtual)/ []  [facility]/ [] Home Care/ [] Refused Recommended Disposition /[] [place] [facility]/ []  Follow-up with PCP Additional Notes: called office and made appt with Dr Judithann Graves Reason for Disposition  [1] Longstanding difficulty breathing (e.g., CHF, COPD, emphysema) AND [2] WORSE than normal  Answer Assessment - Initial Assessment Questions 1. RESPIRATORY STATUS: "Describe your breathing?" (e.g., wheezing, shortness of breath, unable to speak, severe coughing)      wheezing 2. ONSET: "When did this breathing problem begin?"      Yesterday cough- cough is congested - white   3. PATTERN "Does the difficult breathing come and go, or has it been constant since it started?"      tesy 4. SEVERITY: "How bad is your breathing?" (e.g., mild, moderate, severe)    - MILD: No SOB at rest, mild SOB with walking, speaks normally in sentences, can lie down, no retractions, pulse < 100.    - MODERATE: SOB at rest, SOB with minimal exertion and prefers to sit, cannot lie down flat, speaks in phrases, mild retractions, audible wheezing, pulse 100-120.    - SEVERE: Very SOB at rest, speaks in single words, struggling to breathe, sitting hunched forward, retractions, pulse > 120     moderate 5. RECURRENT SYMPTOM: "Have you had difficulty breathing before?" If Yes, ask: "When was the last time?" and "What happened that time?"      Yes   7. LUNG HISTORY: "Do you have any history of lung disease?"  (e.g., pulmonary embolus, asthma, emphysema)     COPD 8. CAUSE: "What do you think is causing the breathing problem?"      Residents  9. OTHER SYMPTOMS: "Do you have  any other symptoms? (e.g., dizziness, runny nose, cough, chest pain, fever)     98.9 (97) SOB  10. O2 SATURATION MONITOR:  "Do you use an oxygen saturation monitor (pulse oximeter) at home?" If Yes, ask: "What is your reading (oxygen level) today?" "What is your usual oxygen saturation reading?" (e.g., 95%)       95  Protocols used: Breathing Difficulty-A-AH

## 2023-04-28 ENCOUNTER — Encounter: Payer: Medicare HMO | Admitting: Internal Medicine

## 2023-05-05 ENCOUNTER — Encounter: Payer: Medicare HMO | Admitting: Internal Medicine

## 2023-05-11 ENCOUNTER — Other Ambulatory Visit: Payer: Self-pay | Admitting: Internal Medicine

## 2023-05-11 DIAGNOSIS — G250 Essential tremor: Secondary | ICD-10-CM

## 2023-05-11 DIAGNOSIS — I10 Essential (primary) hypertension: Secondary | ICD-10-CM

## 2023-05-15 ENCOUNTER — Other Ambulatory Visit: Payer: Self-pay | Admitting: Internal Medicine

## 2023-05-15 DIAGNOSIS — F418 Other specified anxiety disorders: Secondary | ICD-10-CM

## 2023-05-16 ENCOUNTER — Other Ambulatory Visit: Payer: Self-pay | Admitting: Internal Medicine

## 2023-05-16 DIAGNOSIS — F418 Other specified anxiety disorders: Secondary | ICD-10-CM

## 2023-05-21 DIAGNOSIS — I779 Disorder of arteries and arterioles, unspecified: Secondary | ICD-10-CM | POA: Diagnosis not present

## 2023-06-07 ENCOUNTER — Other Ambulatory Visit: Payer: Self-pay | Admitting: Internal Medicine

## 2023-06-07 DIAGNOSIS — I2510 Atherosclerotic heart disease of native coronary artery without angina pectoris: Secondary | ICD-10-CM

## 2023-06-07 DIAGNOSIS — E559 Vitamin D deficiency, unspecified: Secondary | ICD-10-CM

## 2023-06-07 DIAGNOSIS — E782 Mixed hyperlipidemia: Secondary | ICD-10-CM

## 2023-06-15 ENCOUNTER — Encounter: Payer: Self-pay | Admitting: Internal Medicine

## 2023-06-15 ENCOUNTER — Ambulatory Visit (INDEPENDENT_AMBULATORY_CARE_PROVIDER_SITE_OTHER): Payer: Medicare PPO | Admitting: Internal Medicine

## 2023-06-15 VITALS — BP 136/88 | HR 69 | Ht 61.0 in | Wt 160.2 lb

## 2023-06-15 DIAGNOSIS — Z23 Encounter for immunization: Secondary | ICD-10-CM | POA: Diagnosis not present

## 2023-06-15 DIAGNOSIS — Z1231 Encounter for screening mammogram for malignant neoplasm of breast: Secondary | ICD-10-CM

## 2023-06-15 DIAGNOSIS — F418 Other specified anxiety disorders: Secondary | ICD-10-CM | POA: Diagnosis not present

## 2023-06-15 DIAGNOSIS — I2510 Atherosclerotic heart disease of native coronary artery without angina pectoris: Secondary | ICD-10-CM

## 2023-06-15 DIAGNOSIS — F17201 Nicotine dependence, unspecified, in remission: Secondary | ICD-10-CM | POA: Diagnosis not present

## 2023-06-15 DIAGNOSIS — E559 Vitamin D deficiency, unspecified: Secondary | ICD-10-CM | POA: Diagnosis not present

## 2023-06-15 DIAGNOSIS — I10 Essential (primary) hypertension: Secondary | ICD-10-CM | POA: Diagnosis not present

## 2023-06-15 DIAGNOSIS — Z0000 Encounter for general adult medical examination without abnormal findings: Secondary | ICD-10-CM

## 2023-06-15 DIAGNOSIS — I714 Abdominal aortic aneurysm, without rupture, unspecified: Secondary | ICD-10-CM

## 2023-06-15 DIAGNOSIS — N1832 Chronic kidney disease, stage 3b: Secondary | ICD-10-CM

## 2023-06-15 DIAGNOSIS — E782 Mixed hyperlipidemia: Secondary | ICD-10-CM

## 2023-06-15 MED ORDER — LISINOPRIL 20 MG PO TABS
20.0000 mg | ORAL_TABLET | Freq: Every day | ORAL | 1 refills | Status: DC
Start: 2023-06-15 — End: 2023-12-23

## 2023-06-15 MED ORDER — REPATHA 140 MG/ML ~~LOC~~ SOSY
140.0000 mg | PREFILLED_SYRINGE | SUBCUTANEOUS | 3 refills | Status: DC
Start: 2023-06-15 — End: 2024-06-08

## 2023-06-15 NOTE — Assessment & Plan Note (Signed)
Clinically stable on current regimen with good control of symptoms, No SI or HI. On Lexapro 20 mg No change in management at this time.

## 2023-06-15 NOTE — Progress Notes (Signed)
Date:  06/15/2023   Name:  Tiffany Bonilla   DOB:  10-Oct-1955   MRN:  [id number]   Chief Complaint: Annual Exam Tiffany Bonilla is a 67 y.o. female who presents today for her Complete Annual Exam. She feels fairly well. She reports exercising. She reports she is sleeping fairly well. Breast complaints - none.  She is still working full time.    Mammogram: 06/2022 DEXA: 04/2021 osteopenia Colonoscopy: 10/2021 repeat 3 yrs  Health Maintenance Due  Topic Date Due   DTaP/Tdap/Td (2 - Td or Tdap) 10/30/2020   Zoster Vaccines- Shingrix (2 of 2) 06/13/2021   Lung Cancer Screening  09/14/2021   COVID-19 Vaccine (5 - 2023-24 season) 05/03/2023    Immunization History  Administered Date(s) Administered   Fluad Quad(high Dose 65+) 10/29/2020, 09/18/2022   Fluad Trivalent(High Dose 65+) 06/15/2023   Influenza,inj,Quad PF,6+ Mos 07/13/2019   Influenza-Unspecified 06/28/2013, 06/18/2015   Moderna Sars-Covid-2 Vaccination 09/14/2019, 10/24/2019, 10/02/2020   Pfizer Covid-19 Vaccine Bivalent Booster 64yrs & up 05/28/2021   Pneumococcal Conjugate-13 10/29/2020   Pneumococcal Polysaccharide-23 06/29/2015, 11/25/2018   Pneumococcal-Unspecified 01/13/2011   Tdap 10/31/2010   Zoster Recombinant(Shingrix) 04/18/2021     Depression        This is a chronic problem.The problem is unchanged.  Associated symptoms include no fatigue and no headaches.  Past treatments include SNRIs - Serotonin and norepinephrine reuptake inhibitors.  Compliance with treatment is good. Hyperlipidemia This is a chronic problem. The problem is controlled. Pertinent negatives include no chest pain or shortness of breath. Current antihyperlipidemic treatment includes statins (and Repatha).  Hypertension This is a chronic problem. The problem is controlled. Pertinent negatives include no chest pain, headaches, palpitations or shortness of breath. Past treatments include ACE inhibitors. Hypertensive end-organ  damage includes kidney disease and CAD/MI. There is no history of CVA.    Review of Systems  Constitutional:  Negative for fatigue and unexpected weight change.  HENT:  Negative for nosebleeds.   Eyes:  Negative for visual disturbance.  Respiratory:  Negative for cough, chest tightness, shortness of breath and wheezing.   Cardiovascular:  Negative for chest pain, palpitations and leg swelling.  Gastrointestinal:  Negative for abdominal pain, constipation and diarrhea.  Neurological:  Negative for dizziness, weakness, light-headedness and headaches.  Psychiatric/Behavioral:  Positive for depression.      Lab Results  Component Value Date   NA 145 (H) 04/24/2022   K 4.2 04/24/2022   CO2 20 04/24/2022   GLUCOSE 85 04/24/2022   BUN 10 04/24/2022   CREATININE 0.96 04/24/2022   CALCIUM 8.9 04/24/2022   EGFR 65 04/24/2022   GFRNONAA 50 (L) 04/09/2020   Lab Results  Component Value Date   CHOL 105 09/14/2022   HDL 50 09/14/2022   LDLCALC 41 09/14/2022   TRIG 68 09/14/2022   CHOLHDL 3.1 04/24/2022   Lab Results  Component Value Date   TSH 1.980 04/24/2022   Lab Results  Component Value Date   HGBA1C 5.5 02/16/2019   Lab Results  Component Value Date   WBC 8.0 04/24/2022   HGB 14.6 04/24/2022   HCT 43.5 04/24/2022   MCV 93 04/24/2022   PLT 182 04/24/2022   Lab Results  Component Value Date   ALT 16 04/24/2022   AST 22 04/24/2022   ALKPHOS 69 04/24/2022   BILITOT 0.3 04/24/2022   Lab Results  Component Value Date   VD25OH 13.3 (L) 04/24/2022     Patient Active Problem List  Diagnosis Date Noted   Chronic ischemic left middle cerebral artery (MCA) stroke 09/22/2022   Carotid artery thrombosis, left 09/13/2022   Centrilobular emphysema (HCC) 04/24/2022   Polyp of descending colon    Chronic midline low back pain without sciatica 06/19/2021   Chronic kidney disease, stage 3b (HCC) 04/19/2021   Atrophic kidney 09/27/2020   Aortic aneurysm, abdominal (HCC)  09/27/2020   Benign essential tremor 04/09/2020   Vitamin D deficiency 09/06/2019   Other specified anxiety disorders 02/16/2019   Lichen sclerosus et atrophicus of the vulva 11/25/2018   Essential hypertension 11/24/2018   Old lacunar stroke without late effect 11/24/2018   Tobacco abuse, in remission 10/25/2015   Hx of hepatitis C 10/25/2015   CAD in native artery 05/24/2015   Mixed hyperlipidemia 06/12/2011    Allergies  Allergen Reactions   Morphine Hives and Shortness Of Breath    Stopped breathing    Scopolamine Other (See Comments)    Unconscious    Codeine Nausea Only and Other (See Comments)    NAUSEA/VOMITING    Sulfa Antibiotics Hives and Nausea Only    Other Reaction: Not Assessed     Past Surgical History:  Procedure Laterality Date   CARDIAC ELECTROPHYSIOLOGY STUDY AND ABLATION  2006   for SVT   CATARACT EXTRACTION W/PHACO Right 02/06/2021   Procedure: CATARACT EXTRACTION PHACO AND INTRAOCULAR LENS PLACEMENT (IOC) RIGHT;  Surgeon: Lockie Mola, MD;  Location: [facility];  Service: Ophthalmology;  Laterality: Right;  2.85 0:32.6 8.7%   CATARACT EXTRACTION W/PHACO Left 02/20/2021   Procedure: CATARACT EXTRACTION PHACO AND INTRAOCULAR LENS PLACEMENT (IOC) LEFT;  Surgeon: Lockie Mola, MD;  Location: [facility];  Service: Ophthalmology;  Laterality: Left;  3.12 0:39.5   COLONOSCOPY  2006   COLONOSCOPY WITH PROPOFOL N/A 10/25/2021   Procedure: COLONOSCOPY WITH BIOPSY;  Surgeon: Midge Minium, MD;  Location: [facility];  Service: Endoscopy;  Laterality: N/A;   CORONARY ANGIOPLASTY WITH STENT PLACEMENT  2001   to Circumfx   ESOPHAGOGASTRODUODENOSCOPY  2017   LAPAROSCOPIC CHOLECYSTECTOMY  04/25/2019   @ [facility]   LAPAROSCOPIC GASTRIC SLEEVE RESECTION  11/07/2015   POLYPECTOMY N/A 10/25/2021   Procedure: POLYPECTOMY;  Surgeon: Midge Minium, MD;  Location: [facility];  Service: Endoscopy;  Laterality: N/A;   TOTAL  ABDOMINAL HYSTERECTOMY  1992   CIS cervix    Social History   Tobacco Use   Smoking status: Former    Current packs/day: 0.00    Average packs/day: 0.5 packs/day for 44.0 years (22.0 ttl pk-yrs)    Types: Cigarettes    Start date: 50    Quit date: 09/13/2022    Years since quitting: 0.7   Smokeless tobacco: Never  Vaping Use   Vaping status: Never Used  Substance Use Topics   Alcohol use: Not Currently    Comment: rare   Drug use: Never     Medication list has been reviewed and updated.  Current Meds  Medication Sig   albuterol (PROVENTIL) (2.5 MG/3ML) 0.083% nebulizer solution Take 3 mLs (2.5 mg total) by nebulization every 6 (six) hours as needed for wheezing or shortness of breath.   albuterol (VENTOLIN HFA) 108 (90 Base) MCG/ACT inhaler Inhale 2 puffs into the lungs every 6 (six) hours as needed for wheezing or shortness of breath.   aspirin EC 81 MG tablet Take 1 tablet by mouth daily.   cholecalciferol (VITAMIN D3) 25 MCG (1000 UNIT) tablet Take 2,000 Units by mouth daily.  clobetasol cream (TEMOVATE) 0.05 % APPLY TOPICALLY TO THE AFFECTED AREA TWICE DAILY   escitalopram (LEXAPRO) 20 MG tablet TAKE 1 TABLET(20 MG) BY MOUTH DAILY   gabapentin (NEURONTIN) 100 MG capsule TAKE 2 CAPSULES(200 MG) BY MOUTH TWICE DAILY   Melatonin 10 MG TABS Take 1 tablet by mouth at bedtime.   Multiple Vitamins-Minerals (ALIVE WOMENS ENERGY) TABS Take by mouth.   rosuvastatin (CRESTOR) 40 MG tablet TAKE 1 TABLET(40 MG) BY MOUTH DAILY   Vitamin D, Ergocalciferol, (DRISDOL) 1.25 MG (50000 UNIT) CAPS capsule TAKE 1 CAPSULE BY MOUTH EVERY 7 DAYS   [DISCONTINUED] lisinopril (ZESTRIL) 10 MG tablet TAKE 1 TABLET(10 MG) BY MOUTH DAILY   [DISCONTINUED] REPATHA 140 MG/ML SOSY INJECT 140 MG UNDER THE SKIN EVERY 14 DAYS       06/15/2023   10:08 AM 02/24/2023    4:25 PM 12/01/2022    2:37 PM 09/22/2022    2:05 PM  GAD 7 : Generalized Anxiety Score  Nervous, Anxious, on Edge 0 0 0 0  Control/stop  worrying 0 0 0 0  Worry too much - different things 0 0 0 0  Trouble relaxing 0 0 0 0  Restless 0 0 0 0  Easily annoyed or irritable 0 0 0 0  Afraid - awful might happen 0 0 0 0  Total GAD 7 Score 0 0 0 0  Anxiety Difficulty Not difficult at all Not difficult at all Not difficult at all Not difficult at all       06/15/2023   10:07 AM 02/24/2023    4:25 PM 12/01/2022    2:37 PM  Depression screen PHQ 2/9  Decreased Interest 0 0 0  Down, Depressed, Hopeless 0 0 0  PHQ - 2 Score 0 0 0  Altered sleeping 0 0 0  Tired, decreased energy 0 0 0  Change in appetite 0 0 0  Feeling bad or failure about yourself  0 0 0  Trouble concentrating 0 0 0  Moving slowly or fidgety/restless 0 0 0  Suicidal thoughts 0 0 0  PHQ-9 Score 0 0 0  Difficult doing work/chores Not difficult at all Not difficult at all Not difficult at all    BP Readings from Last 3 Encounters:  06/15/23 136/88  02/24/23 122/78  12/01/22 124/66    Physical Exam Vitals and nursing note reviewed.  Constitutional:      General: She is not in acute distress.    Appearance: She is well-developed.  HENT:     Head: Normocephalic and atraumatic.     Right Ear: Tympanic membrane and ear canal normal.     Left Ear: Tympanic membrane and ear canal normal.     Nose:     Right Sinus: No maxillary sinus tenderness.     Left Sinus: No maxillary sinus tenderness.  Eyes:     General: No scleral icterus.       Right eye: No discharge.        Left eye: No discharge.     Conjunctiva/sclera: Conjunctivae normal.  Neck:     Thyroid: No thyromegaly.     Vascular: No carotid bruit.  Cardiovascular:     Rate and Rhythm: Normal rate and regular rhythm.     Pulses: Normal pulses.     Heart sounds: Normal heart sounds.  Pulmonary:     Effort: Pulmonary effort is normal. No respiratory distress.     Breath sounds: No wheezing.  Chest:  Breasts:    Right:  No mass, nipple discharge, skin change or tenderness.     Left: No mass,  nipple discharge, skin change or tenderness.  Abdominal:     General: Bowel sounds are normal.     Palpations: Abdomen is soft.     Tenderness: There is no abdominal tenderness.  Musculoskeletal:     Cervical back: Normal range of motion. No erythema.     Right lower leg: No edema.     Left lower leg: No edema.  Lymphadenopathy:     Cervical: No cervical adenopathy.  Skin:    General: Skin is warm and dry.     Findings: No rash.  Neurological:     Mental Status: She is alert and oriented to person, place, and time.     Cranial Nerves: No cranial nerve deficit.     Sensory: No sensory deficit.     Deep Tendon Reflexes: Reflexes are normal and symmetric.  Psychiatric:        Attention and Perception: Attention normal.        Mood and Affect: Mood normal.     Wt Readings from Last 3 Encounters:  06/15/23 160 lb 3.2 oz (72.7 kg)  02/24/23 157 lb (71.2 kg)  12/01/22 151 lb (68.5 kg)    BP 136/88   Pulse 69   Ht 5\' 1"  (1.549 m)   Wt 160 lb 3.2 oz (72.7 kg)   SpO2 98%   BMI 30.27 kg/m   Assessment and Plan:  Problem List Items Addressed This Visit       Unprioritized   Aortic aneurysm, abdominal (HCC) (Chronic)    Due for 3 year follow up.      Relevant Medications   Evolocumab (REPATHA) 140 MG/ML SOSY   lisinopril (ZESTRIL) 20 MG tablet   Other Relevant Orders   US Abdomen Complete   CAD in native artery - Primary (Chronic)    On statin,ASA, Repatha and ACE. Has been out of Repatha due to pharmacy issues      Relevant Medications   Evolocumab (REPATHA) 140 MG/ML SOSY   lisinopril (ZESTRIL) 20 MG tablet   Other Relevant Orders   Lipid panel   Chronic kidney disease, stage 3b (HCC) (Chronic)    Stable mild decrease in renal function      Relevant Orders   Comprehensive metabolic panel   Essential hypertension (Chronic)    Normal exam with elevated BP on lisinopril 10 mg. Will increase to 20 mg. No concerns or side effects to current  medication. Recommend working on low sodium diet.       Relevant Medications   Evolocumab (REPATHA) 140 MG/ML SOSY   lisinopril (ZESTRIL) 20 MG tablet   Other Relevant Orders   CBC with Differential/Platelet   Comprehensive metabolic panel   Urinalysis, Routine w reflex microscopic   Mixed hyperlipidemia (Chronic)    LDL is  Lab Results  Component Value Date   LDLCALC 41 09/14/2022   Currently being treated with Crestor and Repatha (will resume with new Rx) with good compliance and no concerns.       Relevant Medications   Evolocumab (REPATHA) 140 MG/ML SOSY   lisinopril (ZESTRIL) 20 MG tablet   Other specified anxiety disorders    Clinically stable on current regimen with good control of symptoms, No SI or HI. On Lexapro 20 mg No change in management at this time.       Relevant Orders   TSH   Tobacco abuse, in remission  Now quit since January Has not has LDCT since 2022 - will resume yearly      Relevant Orders   Ambulatory Referral Lung Cancer Screening [facility]   Vitamin D deficiency (Chronic)    On daily supplement for very low value last year.      Relevant Orders   VITAMIN D 25 Hydroxy (Vit-D Deficiency, Fractures)   Other Visit Diagnoses     Encounter for screening mammogram for breast cancer       Relevant Orders   MM 3D SCREENING MAMMOGRAM BILATERAL BREAST   Annual physical exam       Need for influenza vaccination       Relevant Orders   Flu Vaccine Trivalent High Dose (Fluad) (Completed)       Return in about 6 months (around 12/14/2023) for HTN.    Reubin Milan, MD [facility] [place]

## 2023-06-15 NOTE — Assessment & Plan Note (Signed)
Due for 3 year follow up.

## 2023-06-15 NOTE — Assessment & Plan Note (Addendum)
Normal exam with elevated BP on lisinopril 10 mg. Will increase to 20 mg. No concerns or side effects to current medication. Recommend working on low sodium diet.

## 2023-06-15 NOTE — Assessment & Plan Note (Addendum)
On statin,ASA, Repatha and ACE. Has been out of Repatha due to pharmacy issues

## 2023-06-15 NOTE — Assessment & Plan Note (Signed)
On daily supplement for very low value last year.

## 2023-06-15 NOTE — Assessment & Plan Note (Addendum)
LDL is  Lab Results  Component Value Date   LDLCALC 41 09/14/2022   Currently being treated with Crestor and Repatha (will resume with new Rx) with good compliance and no concerns.

## 2023-06-15 NOTE — Assessment & Plan Note (Signed)
Stable mild decrease in renal function

## 2023-06-15 NOTE — Assessment & Plan Note (Signed)
Now quit since January Has not has LDCT since 2022 - will resume yearly

## 2023-06-16 ENCOUNTER — Ambulatory Visit: Admission: RE | Admit: 2023-06-16 | Payer: Medicare PPO | Source: Ambulatory Visit

## 2023-06-16 LAB — CBC WITH DIFFERENTIAL/PLATELET
Basophils Absolute: 0.1 10*3/uL (ref 0.0–0.2)
Basos: 2 %
EOS (ABSOLUTE): 0.4 10*3/uL (ref 0.0–0.4)
Eos: 5 %
Hematocrit: 44.6 % (ref 34.0–46.6)
Hemoglobin: 14.7 g/dL (ref 11.1–15.9)
Immature Grans (Abs): 0 10*3/uL (ref 0.0–0.1)
Immature Granulocytes: 0 %
Lymphocytes Absolute: 1.8 10*3/uL (ref 0.7–3.1)
Lymphs: 22 %
MCH: 29.9 pg (ref 26.6–33.0)
MCHC: 33 g/dL (ref 31.5–35.7)
MCV: 91 fL (ref 79–97)
Monocytes Absolute: 0.8 10*3/uL (ref 0.1–0.9)
Monocytes: 9 %
Neutrophils Absolute: 5 10*3/uL (ref 1.4–7.0)
Neutrophils: 62 %
Platelets: 217 10*3/uL (ref 150–450)
RBC: 4.92 x10E6/uL (ref 3.77–5.28)
RDW: 12.5 % (ref 11.7–15.4)
WBC: 8.1 10*3/uL (ref 3.4–10.8)

## 2023-06-16 LAB — COMPREHENSIVE METABOLIC PANEL
ALT: 11 [IU]/L (ref 0–32)
AST: 18 [IU]/L (ref 0–40)
Albumin: 4.1 g/dL (ref 3.9–4.9)
Alkaline Phosphatase: 67 [IU]/L (ref 44–121)
BUN/Creatinine Ratio: 12 (ref 12–28)
BUN: 13 mg/dL (ref 8–27)
Bilirubin Total: 0.4 mg/dL (ref 0.0–1.2)
CO2: 23 mmol/L (ref 20–29)
Calcium: 9 mg/dL (ref 8.7–10.3)
Chloride: 105 mmol/L (ref 96–106)
Creatinine, Ser: 1.11 mg/dL — ABNORMAL HIGH (ref 0.57–1.00)
Globulin, Total: 2.4 g/dL (ref 1.5–4.5)
Glucose: 82 mg/dL (ref 70–99)
Potassium: 4 mmol/L (ref 3.5–5.2)
Sodium: 142 mmol/L (ref 134–144)
Total Protein: 6.5 g/dL (ref 6.0–8.5)
eGFR: 54 mL/min/{1.73_m2} — ABNORMAL LOW (ref >59)

## 2023-06-16 LAB — LIPID PANEL
Chol/HDL Ratio: 3.3 {ratio} (ref 0.0–4.4)
Cholesterol, Total: 219 mg/dL — ABNORMAL HIGH (ref 100–199)
HDL: 67 mg/dL (ref >39)
LDL Chol Calc (NIH): 137 mg/dL — ABNORMAL HIGH (ref 0–99)
Triglycerides: 85 mg/dL (ref 0–149)
VLDL Cholesterol Cal: 15 mg/dL (ref 5–40)

## 2023-06-16 LAB — URINALYSIS, ROUTINE W REFLEX MICROSCOPIC
Bilirubin, UA: NEGATIVE
Glucose, UA: NEGATIVE
Ketones, UA: NEGATIVE
Nitrite, UA: NEGATIVE
RBC, UA: NEGATIVE
Specific Gravity, UA: 1.02 (ref 1.005–1.030)
Urobilinogen, Ur: 1 mg/dL (ref 0.2–1.0)
pH, UA: 6 (ref 5.0–7.5)

## 2023-06-16 LAB — TSH: TSH: 1.59 u[IU]/mL (ref 0.450–4.500)

## 2023-06-16 LAB — MICROSCOPIC EXAMINATION
Casts: NONE SEEN /[LPF]
RBC, Urine: NONE SEEN /[HPF] (ref 0–2)

## 2023-06-16 LAB — VITAMIN D 25 HYDROXY (VIT D DEFICIENCY, FRACTURES): Vit D, 25-Hydroxy: 27.5 ng/mL — ABNORMAL LOW (ref 30.0–100.0)

## 2023-07-15 ENCOUNTER — Encounter: Payer: Self-pay | Admitting: Internal Medicine

## 2023-07-15 ENCOUNTER — Ambulatory Visit (INDEPENDENT_AMBULATORY_CARE_PROVIDER_SITE_OTHER): Payer: Medicare PPO | Admitting: Internal Medicine

## 2023-07-15 ENCOUNTER — Ambulatory Visit: Payer: Self-pay

## 2023-07-15 VITALS — BP 124/78 | HR 84 | Temp 97.7°F | Ht 61.0 in | Wt 161.0 lb

## 2023-07-15 DIAGNOSIS — K591 Functional diarrhea: Secondary | ICD-10-CM | POA: Diagnosis not present

## 2023-07-15 DIAGNOSIS — J40 Bronchitis, not specified as acute or chronic: Secondary | ICD-10-CM | POA: Diagnosis not present

## 2023-07-15 MED ORDER — PROMETHAZINE-DM 6.25-15 MG/5ML PO SYRP: 5.0000 mL | ORAL_SOLUTION | Freq: Four times a day (QID) | ORAL | 0 refills | Status: AC | PRN

## 2023-07-15 MED ORDER — ALBUTEROL SULFATE (2.5 MG/3ML) 0.083% IN NEBU: 2.5000 mg | INHALATION_SOLUTION | Freq: Four times a day (QID) | RESPIRATORY_TRACT | 1 refills | Status: DC | PRN

## 2023-07-15 MED ORDER — DOXYCYCLINE HYCLATE 100 MG PO TABS: 100.0000 mg | ORAL_TABLET | Freq: Two times a day (BID) | ORAL | 0 refills | Status: AC

## 2023-07-15 NOTE — Telephone Encounter (Signed)
Reason for Disposition . [1] SEVERE diarrhea (e.g., 7 or more times / day more than normal) AND [2] present > 24 hours (1 day)  Answer Assessment - Initial Assessment Questions 1. DIARRHEA SEVERITY: "How bad is the diarrhea?" "How many more stools have you had in the past 24 hours than normal?"    - NO DIARRHEA (SCALE 0)   - MILD (SCALE 1-3): Few loose or mushy BMs; increase of 1-3 stools over normal daily number of stools; mild increase in ostomy output.   -  MODERATE (SCALE 4-7): Increase of 4-6 stools daily over normal; moderate increase in ostomy output.   -  SEVERE (SCALE 8-10; OR "WORST POSSIBLE"): Increase of 7 or more stools daily over normal; moderate increase in ostomy output; incontinence.     8 or more stools   2. ONSET: "When did the diarrhea begin?"      This morming 3. BM CONSISTENCY: "How loose or watery is the diarrhea?"      watery 4. VOMITING: "Are you also vomiting?" If Yes, ask: "How many times in the past 24 hours?"      No 5. ABDOMEN PAIN: "Are you having any abdomen pain?" If Yes, ask: "What does it feel like?" (e.g., crampy, dull, intermittent, constant)      Yes 6. ABDOMEN PAIN SEVERITY: If present, ask: "How bad is the pain?"  (e.g., Scale 1-10; mild, moderate, or severe)   - MILD (1-3): doesn't interfere with normal activities, abdomen soft and not tender to touch    - MODERATE (4-7): interferes with normal activities or awakens from sleep, abdomen tender to touch    - SEVERE (8-10): excruciating pain, doubled over, unable to do any normal activities       6  Protocols used: [facility]

## 2023-07-15 NOTE — Patient Instructions (Signed)
Can use Imodium 2 mg twice a day to cut back on diarrhea.

## 2023-07-15 NOTE — Progress Notes (Signed)
Date:  07/15/2023   Name:  Tiffany Bonilla   DOB:  1955/12/04   MRN:  [id number]   Chief Complaint: Diarrhea (Patient said she has had diarrhea for 2 days. Yesterday it was black, today is was green. Cough- brown production. Shortness of breath. Fever 2 nights ago was 102. Patient tested for Covid at home and it was negative. )  Diarrhea  This is a new problem. The current episode started yesterday. The problem occurs 5 to 10 times per day. The stool consistency is described as Mucous and watery. The patient states that diarrhea awakens her from sleep. Associated symptoms include abdominal pain (LLQ), coughing and a fever (now resolved). Pertinent negatives include no arthralgias, bloating, chills, vomiting or weight loss. There are no known risk factors. She has tried nothing for the symptoms.  Cough This is a new problem. Episode onset: 5 days ago. The problem occurs every few minutes. The cough is Productive of sputum. Associated symptoms include a fever (now resolved), shortness of breath and wheezing. Pertinent negatives include no chest pain, chills, hemoptysis or weight loss. The symptoms are aggravated by exercise. She has tried a beta-agonist inhaler for the symptoms. The treatment provided mild relief.    Review of Systems  Constitutional:  Positive for fever (now resolved). Negative for chills, fatigue and weight loss.  Respiratory:  Positive for cough, shortness of breath and wheezing. Negative for hemoptysis.   Cardiovascular:  Negative for chest pain, palpitations and leg swelling.  Gastrointestinal:  Positive for abdominal pain (LLQ) and diarrhea. Negative for bloating, blood in stool, constipation and vomiting.  Musculoskeletal:  Negative for arthralgias.  Psychiatric/Behavioral:  Negative for dysphoric mood and sleep disturbance. The patient is not nervous/anxious.      Lab Results  Component Value Date   NA 142 06/15/2023   K 4.0 06/15/2023   CO2 23 06/15/2023    GLUCOSE 82 06/15/2023   BUN 13 06/15/2023   CREATININE 1.11 (H) 06/15/2023   CALCIUM 9.0 06/15/2023   EGFR 54 (L) 06/15/2023   GFRNONAA 50 (L) 04/09/2020   Lab Results  Component Value Date   CHOL 219 (H) 06/15/2023   HDL 67 06/15/2023   LDLCALC 137 (H) 06/15/2023   TRIG 85 06/15/2023   CHOLHDL 3.3 06/15/2023   Lab Results  Component Value Date   TSH 1.590 06/15/2023   Lab Results  Component Value Date   HGBA1C 5.5 02/16/2019   Lab Results  Component Value Date   WBC 8.1 06/15/2023   HGB 14.7 06/15/2023   HCT 44.6 06/15/2023   MCV 91 06/15/2023   PLT 217 06/15/2023   Lab Results  Component Value Date   ALT 11 06/15/2023   AST 18 06/15/2023   ALKPHOS 67 06/15/2023   BILITOT 0.4 06/15/2023   Lab Results  Component Value Date   VD25OH 27.5 (L) 06/15/2023     Patient Active Problem List   Diagnosis Date Noted   Chronic ischemic left middle cerebral artery (MCA) stroke 09/22/2022   Carotid artery thrombosis, left 09/13/2022   Centrilobular emphysema (HCC) 04/24/2022   Polyp of descending colon    Chronic midline low back pain without sciatica 06/19/2021   Chronic kidney disease, stage 3b (HCC) 04/19/2021   Atrophic kidney 09/27/2020   Aortic aneurysm, abdominal (HCC) 09/27/2020   Benign essential tremor 04/09/2020   Vitamin D deficiency 09/06/2019   Other specified anxiety disorders 02/16/2019   Lichen sclerosus et atrophicus of the vulva 11/25/2018   Essential  hypertension 11/24/2018   Old lacunar stroke without late effect 11/24/2018   Tobacco abuse, in remission 10/25/2015   Hx of hepatitis C 10/25/2015   CAD in native artery 05/24/2015   Mixed hyperlipidemia 06/12/2011    Allergies  Allergen Reactions   Morphine Hives and Shortness Of Breath    Stopped breathing    Scopolamine Other (See Comments)    Unconscious    Codeine Nausea Only and Other (See Comments)    NAUSEA/VOMITING    Sulfa Antibiotics Hives and Nausea Only    Other Reaction:  Not Assessed     Past Surgical History:  Procedure Laterality Date   CARDIAC ELECTROPHYSIOLOGY STUDY AND ABLATION  2006   for SVT   CATARACT EXTRACTION W/PHACO Right 02/06/2021   Procedure: CATARACT EXTRACTION PHACO AND INTRAOCULAR LENS PLACEMENT (IOC) RIGHT;  Surgeon: Lockie Mola, MD;  Location: [facility];  Service: Ophthalmology;  Laterality: Right;  2.85 0:32.6 8.7%   CATARACT EXTRACTION W/PHACO Left 02/20/2021   Procedure: CATARACT EXTRACTION PHACO AND INTRAOCULAR LENS PLACEMENT (IOC) LEFT;  Surgeon: Lockie Mola, MD;  Location: [facility];  Service: Ophthalmology;  Laterality: Left;  3.12 0:39.5   COLONOSCOPY  2006   COLONOSCOPY WITH PROPOFOL N/A 10/25/2021   Procedure: COLONOSCOPY WITH BIOPSY;  Surgeon: Midge Minium, MD;  Location: [facility];  Service: Endoscopy;  Laterality: N/A;   CORONARY ANGIOPLASTY WITH STENT PLACEMENT  2001   to Circumfx   ESOPHAGOGASTRODUODENOSCOPY  2017   LAPAROSCOPIC CHOLECYSTECTOMY  04/25/2019   @ [facility]   LAPAROSCOPIC GASTRIC SLEEVE RESECTION  11/07/2015   POLYPECTOMY N/A 10/25/2021   Procedure: POLYPECTOMY;  Surgeon: Midge Minium, MD;  Location: [facility];  Service: Endoscopy;  Laterality: N/A;   TOTAL ABDOMINAL HYSTERECTOMY  1992   CIS cervix    Social History   Tobacco Use   Smoking status: Former    Current packs/day: 0.00    Average packs/day: 0.5 packs/day for 44.0 years (22.0 ttl pk-yrs)    Types: Cigarettes    Start date: 28    Quit date: 09/13/2022    Years since quitting: 0.8   Smokeless tobacco: Never  Vaping Use   Vaping status: Never Used  Substance Use Topics   Alcohol use: Not Currently    Comment: rare   Drug use: Never     Medication list has been reviewed and updated.  Current Meds  Medication Sig   albuterol (VENTOLIN HFA) 108 (90 Base) MCG/ACT inhaler Inhale 2 puffs into the lungs every 6 (six) hours as needed for wheezing or shortness of breath.   aspirin EC  81 MG tablet Take 1 tablet by mouth daily.   cholecalciferol (VITAMIN D3) 25 MCG (1000 UNIT) tablet Take 2,000 Units by mouth daily.   clobetasol cream (TEMOVATE) 0.05 % APPLY TOPICALLY TO THE AFFECTED AREA TWICE DAILY   doxycycline (VIBRA-TABS) 100 MG tablet Take 1 tablet (100 mg total) by mouth 2 (two) times daily for 10 days.   escitalopram (LEXAPRO) 20 MG tablet TAKE 1 TABLET(20 MG) BY MOUTH DAILY   Evolocumab (REPATHA) 140 MG/ML SOSY Inject 140 mg into the skin every 14 (fourteen) days.   gabapentin (NEURONTIN) 100 MG capsule TAKE 2 CAPSULES(200 MG) BY MOUTH TWICE DAILY   lisinopril (ZESTRIL) 20 MG tablet Take 1 tablet (20 mg total) by mouth daily. TAKE 1 TABLET(10 MG) BY MOUTH DAILY   Melatonin 10 MG TABS Take 1 tablet by mouth at bedtime.   Multiple Vitamins-Minerals (ALIVE WOMENS ENERGY) TABS Take  by mouth.   promethazine-dextromethorphan (PROMETHAZINE-DM) 6.25-15 MG/5ML syrup Take 5 mLs by mouth 4 (four) times daily as needed for up to 9 days for cough.   rosuvastatin (CRESTOR) 40 MG tablet TAKE 1 TABLET(40 MG) BY MOUTH DAILY   Vitamin D, Ergocalciferol, (DRISDOL) 1.25 MG (50000 UNIT) CAPS capsule TAKE 1 CAPSULE BY MOUTH EVERY 7 DAYS   [DISCONTINUED] albuterol (PROVENTIL) (2.5 MG/3ML) 0.083% nebulizer solution Take 3 mLs (2.5 mg total) by nebulization every 6 (six) hours as needed for wheezing or shortness of breath.       06/15/2023   10:08 AM 02/24/2023    4:25 PM 12/01/2022    2:37 PM 09/22/2022    2:05 PM  GAD 7 : Generalized Anxiety Score  Nervous, Anxious, on Edge 0 0 0 0  Control/stop worrying 0 0 0 0  Worry too much - different things 0 0 0 0  Trouble relaxing 0 0 0 0  Restless 0 0 0 0  Easily annoyed or irritable 0 0 0 0  Afraid - awful might happen 0 0 0 0  Total GAD 7 Score 0 0 0 0  Anxiety Difficulty Not difficult at all Not difficult at all Not difficult at all Not difficult at all       06/15/2023   10:07 AM 02/24/2023    4:25 PM 12/01/2022    2:37 PM   Depression screen PHQ 2/9  Decreased Interest 0 0 0  Down, Depressed, Hopeless 0 0 0  PHQ - 2 Score 0 0 0  Altered sleeping 0 0 0  Tired, decreased energy 0 0 0  Change in appetite 0 0 0  Feeling bad or failure about yourself  0 0 0  Trouble concentrating 0 0 0  Moving slowly or fidgety/restless 0 0 0  Suicidal thoughts 0 0 0  PHQ-9 Score 0 0 0  Difficult doing work/chores Not difficult at all Not difficult at all Not difficult at all    BP Readings from Last 3 Encounters:  07/15/23 124/78  06/15/23 136/88  02/24/23 122/78    Physical Exam Vitals and nursing note reviewed.  Constitutional:      General: She is not in acute distress.    Appearance: She is well-developed.  HENT:     Head: Normocephalic and atraumatic.  Neck:     Vascular: No carotid bruit.  Cardiovascular:     Rate and Rhythm: Normal rate and regular rhythm.  Pulmonary:     Effort: Pulmonary effort is normal. No respiratory distress.     Breath sounds: Normal breath sounds and air entry. No wheezing or rhonchi.  Abdominal:     General: Abdomen is flat. Bowel sounds are normal.     Palpations: Abdomen is soft.     Tenderness: There is abdominal tenderness in the left lower quadrant. There is no guarding or rebound.  Musculoskeletal:     Cervical back: Normal range of motion.     Right lower leg: No edema.     Left lower leg: No edema.  Lymphadenopathy:     Cervical: No cervical adenopathy.  Skin:    General: Skin is warm and dry.     Capillary Refill: Capillary refill takes less than 2 seconds.     Findings: No rash.  Neurological:     General: No focal deficit present.     Mental Status: She is alert and oriented to person, place, and time.  Psychiatric:        Mood  and Affect: Mood normal.        Behavior: Behavior normal.     Wt Readings from Last 3 Encounters:  07/15/23 161 lb (73 kg)  06/15/23 160 lb 3.2 oz (72.7 kg)  02/24/23 157 lb (71.2 kg)    BP 124/78   Pulse 84   Temp 97.7  F (36.5 C) (Oral)   Ht 5\' 1"  (1.549 m)   Wt 161 lb (73 kg)   SpO2 96%   BMI 30.42 kg/m   Assessment and Plan:  Problem List Items Addressed This Visit   None Visit Diagnoses     Bronchitis    -  Primary   Relevant Medications   doxycycline (VIBRA-TABS) 100 MG tablet   promethazine-dextromethorphan (PROMETHAZINE-DM) 6.25-15 MG/5ML syrup   albuterol (PROVENTIL) (2.5 MG/3ML) 0.083% nebulizer solution   Functional diarrhea       triggered by URI and bile excess s/p cholecystectomy use imodium bid prn call if symptoms continue       No follow-ups on file.    Reubin Milan, MD [facility] [place]

## 2023-07-15 NOTE — Telephone Encounter (Signed)
Chief Complaint: Abd pain LLQ Symptoms: diarrhea 8 times or more today, 6/10 pain Frequency: Onset this morning Pertinent Negatives: Patient denies other symptoms Disposition: [] ED /[] Urgent Care (no appt availability in office) / [x] Appointment(In office/virtual)/ []  [facility]/ [] Home Care/ [] Refused Recommended Disposition /[]  Mobile Bus/ []  Follow-up with PCP Additional Notes: Patient says she woke up this morning with abdominal pain and diarrhea x 8 or more already today. Yesterday she had black soft stools, but today it's diarrhea. She says she's getting over a URI. Scheduled today with PCP.   Reason for Disposition  [1] MODERATE pain (e.g., interferes with normal activities) AND [2] pain comes and goes (cramps) AND [3] present > 24 hours  (Exception: Pain with Vomiting or Diarrhea - see that Guideline.)  Answer Assessment - Initial Assessment Questions 1. LOCATION: "Where does it hurt?"      Left lower quadrant 2. RADIATION: "Does the pain shoot anywhere else?" (e.g., chest, back)     No 3. ONSET: "When did the pain begin?" (e.g., minutes, hours or days ago)      This morning 4. SUDDEN: "Gradual or sudden onset?"     Sudden 5. PATTERN "Does the pain come and go, or is it constant?"    - If it comes and goes: "How long does it last?" "Do you have pain now?"     (Note: Comes and goes means the pain is intermittent. It goes away completely between bouts.)    - If constant: "Is it getting better, staying the same, or getting worse?"      (Note: Constant means the pain never goes away completely; most serious pain is constant and gets worse.)      Not constant every time have a BM 6. SEVERITY: "How bad is the pain?"  (e.g., Scale 1-10; mild, moderate, or severe)    - MILD (1-3): Doesn't interfere with normal activities, abdomen soft and not tender to touch.     - MODERATE (4-7): Interferes with normal activities or awakens from sleep, abdomen tender to touch.      - SEVERE (8-10): Excruciating pain, doubled over, unable to do any normal activities.       6-feels like cramps 7. RECURRENT SYMPTOM: "Have you ever had this type of stomach pain before?" If Yes, ask: "When was the last time?" and "What happened that time?"      No 8. CAUSE: "What do you think is causing the stomach pain?"     Unknown 9. RELIEVING/AGGRAVATING FACTORS: "What makes it better or worse?" (e.g., antacids, bending or twisting motion, bowel movement)     Nothing 10. OTHER SYMPTOMS: "Do you have any other symptoms?" (e.g., back pain, diarrhea, fever, urination pain, vomiting)       Diarrhea-8 or more today bright green color  Protocols used: Abdominal Pain - Female-A-AH

## 2023-08-10 ENCOUNTER — Ambulatory Visit
Admission: RE | Admit: 2023-08-10 | Discharge: 2023-08-10 | Disposition: A | Discharge status: Home / Self Care | Payer: Medicare PPO | Attending: Internal Medicine | Admitting: Internal Medicine

## 2023-08-10 ENCOUNTER — Encounter: Payer: Self-pay | Admitting: Internal Medicine

## 2023-08-10 ENCOUNTER — Ambulatory Visit
Admission: RE | Admit: 2023-08-10 | Discharge: 2023-08-10 | Disposition: A | Discharge status: Home / Self Care | Payer: Medicare PPO | Source: Ambulatory Visit | Attending: Internal Medicine | Admitting: Internal Medicine

## 2023-08-10 ENCOUNTER — Ambulatory Visit (INDEPENDENT_AMBULATORY_CARE_PROVIDER_SITE_OTHER): Payer: Medicare PPO | Admitting: Internal Medicine

## 2023-08-10 ENCOUNTER — Ambulatory Visit: Payer: Self-pay

## 2023-08-10 VITALS — BP 124/78 | HR 90 | Temp 97.8°F | Ht 61.0 in | Wt 160.0 lb

## 2023-08-10 DIAGNOSIS — R0781 Pleurodynia: Secondary | ICD-10-CM | POA: Insufficient documentation

## 2023-08-10 DIAGNOSIS — R059 Cough, unspecified: Secondary | ICD-10-CM | POA: Diagnosis not present

## 2023-08-10 DIAGNOSIS — R0602 Shortness of breath: Secondary | ICD-10-CM | POA: Diagnosis not present

## 2023-08-10 MED ORDER — AMOXICILLIN-POT CLAVULANATE 875-125 MG PO TABS: 1.0000 | ORAL_TABLET | Freq: Two times a day (BID) | ORAL | 0 refills | Status: AC

## 2023-08-10 MED ORDER — GUAIFENESIN-CODEINE 100-10 MG/5ML PO SOLN: 5.0000 mL | ORAL | 0 refills | Status: DC | PRN

## 2023-08-10 MED ORDER — PREDNISONE 10 MG PO TABS: ORAL_TABLET | ORAL | 0 refills | Status: AC

## 2023-08-10 NOTE — Telephone Encounter (Signed)
Chief Complaint: SOB Symptoms: SOB, congestion, runny nose Frequency: constant  Pertinent Negatives: Patient denies chest pain, fever, nausea, vomiting  Disposition: [] ED /[] Urgent Care (no appt availability in office) / [x] Appointment(In office/virtual)/ []  [place] [facility]/ [] Home Care/ [] Refused Recommended Disposition /[]  Mobile Bus/ []  Follow-up with PCP Additional Notes: Patient stated she was seen in the office 07/15/23, for bronchitis and was treated with antibiotics and cough medicine. Patient states since completing the antibiotic the cough is worse and she feels like the cough medicine is not helping anymore. Care advice was given and patient has been scheduled to be seen in office today with PCP.  Reason for Disposition  [1] Longstanding difficulty breathing (e.g., CHF, COPD, emphysema) AND [2] WORSE than normal  Answer Assessment - Initial Assessment Questions 1. RESPIRATORY STATUS: "Describe your breathing?" (e.g., wheezing, shortness of breath, unable to speak, severe coughing)      SOB 2. ONSET: "When did this breathing problem begin?"      Over several weeks  3. PATTERN "Does the difficult breathing come and go, or has it been constant since it started?"      Constant  4. SEVERITY: "How bad is your breathing?" (e.g., mild, moderate, severe)    - MILD: No SOB at rest, mild SOB with walking, speaks normally in sentences, can lie down, no retractions, pulse < 100.    - MODERATE: SOB at rest, SOB with minimal exertion and prefers to sit, cannot lie down flat, speaks in phrases, mild retractions, audible wheezing, pulse 100-120.    - SEVERE: Very SOB at rest, speaks in single words, struggling to breathe, sitting hunched forward, retractions, pulse > 120      Moderate  5. RECURRENT SYMPTOM: "Have you had difficulty breathing before?" If Yes, ask: "When was the last time?" and "What happened that time?"      No  6. CARDIAC HISTORY: "Do you have any history of  heart disease?" (e.g., heart attack, angina, bypass surgery, angioplasty)      N/A 7. LUNG HISTORY: "Do you have any history of lung disease?"  (e.g., pulmonary embolus, asthma, emphysema)     COPD and asthma 8. CAUSE: "What do you think is causing the breathing problem?"      Upper Respiratory system  9. OTHER SYMPTOMS: "Do you have any other symptoms? (e.g., dizziness, runny nose, cough, chest pain, fever)     Cough, congestion, runny nos, diarrhea  Protocols used: Breathing Difficulty-A-AH

## 2023-08-10 NOTE — Progress Notes (Signed)
Date:  08/10/2023   Name:  Tiffany Bonilla   DOB:  04/03/1956   MRN:  [id number]   Chief Complaint: Cough (Started in November. Was put on doxycycline and it got better but never fully went away. Feels like she is getting worse. Left sided rib pain under breast when coughing, or breathing. )  Cough This is a chronic problem. The current episode started more than 1 month ago. The problem occurs every few hours. The cough is Non-productive. Associated symptoms include chest pain (left sided rib pain), shortness of breath and wheezing. Pertinent negatives include no chills, ear congestion, fever, headaches or nasal congestion. She has tried a beta-agonist inhaler for the symptoms. The treatment provided mild relief.    Review of Systems  Constitutional:  Negative for chills and fever.  Eyes:  Negative for visual disturbance.  Respiratory:  Positive for cough, shortness of breath and wheezing.   Cardiovascular:  Positive for chest pain (left sided rib pain).  Neurological:  Negative for dizziness, light-headedness and headaches.  Psychiatric/Behavioral:  Negative for dysphoric mood and sleep disturbance. The patient is not nervous/anxious.      Lab Results  Component Value Date   NA 142 06/15/2023   K 4.0 06/15/2023   CO2 23 06/15/2023   GLUCOSE 82 06/15/2023   BUN 13 06/15/2023   CREATININE 1.11 (H) 06/15/2023   CALCIUM 9.0 06/15/2023   EGFR 54 (L) 06/15/2023   GFRNONAA 50 (L) 04/09/2020   Lab Results  Component Value Date   CHOL 219 (H) 06/15/2023   HDL 67 06/15/2023   LDLCALC 137 (H) 06/15/2023   TRIG 85 06/15/2023   CHOLHDL 3.3 06/15/2023   Lab Results  Component Value Date   TSH 1.590 06/15/2023   Lab Results  Component Value Date   HGBA1C 5.5 02/16/2019   Lab Results  Component Value Date   WBC 8.1 06/15/2023   HGB 14.7 06/15/2023   HCT 44.6 06/15/2023   MCV 91 06/15/2023   PLT 217 06/15/2023   Lab Results  Component Value Date   ALT 11 06/15/2023    AST 18 06/15/2023   ALKPHOS 67 06/15/2023   BILITOT 0.4 06/15/2023   Lab Results  Component Value Date   VD25OH 27.5 (L) 06/15/2023     Patient Active Problem List   Diagnosis Date Noted   Chronic ischemic left middle cerebral artery (MCA) stroke 09/22/2022   Carotid artery thrombosis, left 09/13/2022   Centrilobular emphysema (HCC) 04/24/2022   Polyp of descending colon    Chronic midline low back pain without sciatica 06/19/2021   Chronic kidney disease, stage 3b (HCC) 04/19/2021   Atrophic kidney 09/27/2020   Aortic aneurysm, abdominal (HCC) 09/27/2020   Benign essential tremor 04/09/2020   Vitamin D deficiency 09/06/2019   Other specified anxiety disorders 02/16/2019   Lichen sclerosus et atrophicus of the vulva 11/25/2018   Essential hypertension 11/24/2018   Old lacunar stroke without late effect 11/24/2018   Tobacco abuse, in remission 10/25/2015   Hx of hepatitis C 10/25/2015   CAD in native artery 05/24/2015   Mixed hyperlipidemia 06/12/2011    Allergies  Allergen Reactions   Morphine Hives and Shortness Of Breath    Stopped breathing    Scopolamine Other (See Comments)    Unconscious    Codeine Nausea Only and Other (See Comments)    NAUSEA/VOMITING    Sulfa Antibiotics Hives and Nausea Only    Other Reaction: Not Assessed     Past  Surgical History:  Procedure Laterality Date   CARDIAC ELECTROPHYSIOLOGY STUDY AND ABLATION  2006   for SVT   CATARACT EXTRACTION W/PHACO Right 02/06/2021   Procedure: CATARACT EXTRACTION PHACO AND INTRAOCULAR LENS PLACEMENT (IOC) RIGHT;  Surgeon: Lockie Mola, MD;  Location: [facility];  Service: Ophthalmology;  Laterality: Right;  2.85 0:32.6 8.7%   CATARACT EXTRACTION W/PHACO Left 02/20/2021   Procedure: CATARACT EXTRACTION PHACO AND INTRAOCULAR LENS PLACEMENT (IOC) LEFT;  Surgeon: Lockie Mola, MD;  Location: [facility];  Service: Ophthalmology;  Laterality: Left;  3.12 0:39.5    COLONOSCOPY  2006   COLONOSCOPY WITH PROPOFOL N/A 10/25/2021   Procedure: COLONOSCOPY WITH BIOPSY;  Surgeon: Midge Minium, MD;  Location: [facility];  Service: Endoscopy;  Laterality: N/A;   CORONARY ANGIOPLASTY WITH STENT PLACEMENT  2001   to Circumfx   ESOPHAGOGASTRODUODENOSCOPY  2017   LAPAROSCOPIC CHOLECYSTECTOMY  04/25/2019   @ [facility]   LAPAROSCOPIC GASTRIC SLEEVE RESECTION  11/07/2015   POLYPECTOMY N/A 10/25/2021   Procedure: POLYPECTOMY;  Surgeon: Midge Minium, MD;  Location: [facility];  Service: Endoscopy;  Laterality: N/A;   TOTAL ABDOMINAL HYSTERECTOMY  1992   CIS cervix    Social History   Tobacco Use   Smoking status: Former    Current packs/day: 0.00    Average packs/day: 0.5 packs/day for 44.0 years (22.0 ttl pk-yrs)    Types: Cigarettes    Start date: 46    Quit date: 09/13/2022    Years since quitting: 0.9   Smokeless tobacco: Never  Vaping Use   Vaping status: Never Used  Substance Use Topics   Alcohol use: Not Currently    Comment: rare   Drug use: Never     Medication list has been reviewed and updated.  Current Meds  Medication Sig   albuterol (PROVENTIL) (2.5 MG/3ML) 0.083% nebulizer solution Take 3 mLs (2.5 mg total) by nebulization every 6 (six) hours as needed for wheezing or shortness of breath.   albuterol (VENTOLIN HFA) 108 (90 Base) MCG/ACT inhaler Inhale 2 puffs into the lungs every 6 (six) hours as needed for wheezing or shortness of breath.   amoxicillin-clavulanate (AUGMENTIN) 875-125 MG tablet Take 1 tablet by mouth 2 (two) times daily for 10 days.   aspirin EC 81 MG tablet Take 1 tablet by mouth daily.   cholecalciferol (VITAMIN D3) 25 MCG (1000 UNIT) tablet Take 2,000 Units by mouth daily.   clobetasol cream (TEMOVATE) 0.05 % APPLY TOPICALLY TO THE AFFECTED AREA TWICE DAILY   escitalopram (LEXAPRO) 20 MG tablet TAKE 1 TABLET(20 MG) BY MOUTH DAILY   Evolocumab (REPATHA) 140 MG/ML SOSY Inject 140 mg into the skin every 14  (fourteen) days.   gabapentin (NEURONTIN) 100 MG capsule TAKE 2 CAPSULES(200 MG) BY MOUTH TWICE DAILY   guaiFENesin-codeine 100-10 MG/5ML syrup Take 5 mLs by mouth every 4 (four) hours as needed for cough.   lisinopril (ZESTRIL) 20 MG tablet Take 1 tablet (20 mg total) by mouth daily. TAKE 1 TABLET(10 MG) BY MOUTH DAILY   Melatonin 10 MG TABS Take 1 tablet by mouth at bedtime.   Multiple Vitamins-Minerals (ALIVE WOMENS ENERGY) TABS Take by mouth.   predniSONE (DELTASONE) 10 MG tablet Take 6 tablets (60 mg total) by mouth daily with breakfast for 1 day, THEN 5 tablets (50 mg total) daily with breakfast for 1 day, THEN 4 tablets (40 mg total) daily with breakfast for 1 day, THEN 3 tablets (30 mg total) daily with breakfast for 1  day, THEN 2 tablets (20 mg total) daily with breakfast for 1 day, THEN 1 tablet (10 mg total) daily with breakfast for 1 day.   rosuvastatin (CRESTOR) 40 MG tablet TAKE 1 TABLET(40 MG) BY MOUTH DAILY   Vitamin D, Ergocalciferol, (DRISDOL) 1.25 MG (50000 UNIT) CAPS capsule TAKE 1 CAPSULE BY MOUTH EVERY 7 DAYS       08/10/2023    9:43 AM 06/15/2023   10:08 AM 02/24/2023    4:25 PM 12/01/2022    2:37 PM  GAD 7 : Generalized Anxiety Score  Nervous, Anxious, on Edge 0 0 0 0  Control/stop worrying 0 0 0 0  Worry too much - different things 0 0 0 0  Trouble relaxing 0 0 0 0  Restless 0 0 0 0  Easily annoyed or irritable 0 0 0 0  Afraid - awful might happen 0 0 0 0  Total GAD 7 Score 0 0 0 0  Anxiety Difficulty Not difficult at all Not difficult at all Not difficult at all Not difficult at all       08/10/2023    9:43 AM 06/15/2023   10:07 AM 02/24/2023    4:25 PM  Depression screen PHQ 2/9  Decreased Interest 0 0 0  Down, Depressed, Hopeless 0 0 0  PHQ - 2 Score 0 0 0  Altered sleeping 2 0 0  Tired, decreased energy 2 0 0  Change in appetite 0 0 0  Feeling bad or failure about yourself  0 0 0  Trouble concentrating 0 0 0  Moving slowly or fidgety/restless 0 0 0   Suicidal thoughts 0 0 0  PHQ-9 Score 4 0 0  Difficult doing work/chores Not difficult at all Not difficult at all Not difficult at all    BP Readings from Last 3 Encounters:  08/10/23 124/78  07/15/23 124/78  06/15/23 136/88    Physical Exam Vitals and nursing note reviewed.  Constitutional:      General: She is not in acute distress.    Appearance: She is well-developed. She is ill-appearing.  HENT:     Head: Normocephalic and atraumatic.     Nose:     Right Sinus: No maxillary sinus tenderness or frontal sinus tenderness.     Left Sinus: No maxillary sinus tenderness or frontal sinus tenderness.  Cardiovascular:     Rate and Rhythm: Normal rate and regular rhythm.  Pulmonary:     Effort: Pulmonary effort is normal. No respiratory distress.     Breath sounds: Examination of the right-upper field reveals decreased breath sounds and wheezing. Examination of the left-upper field reveals decreased breath sounds and wheezing. Decreased breath sounds and wheezing present. No rhonchi.  Musculoskeletal:     Cervical back: Normal range of motion.  Lymphadenopathy:     Cervical: No cervical adenopathy.  Skin:    General: Skin is warm and dry.     Findings: No rash.  Neurological:     Mental Status: She is alert and oriented to person, place, and time.  Psychiatric:        Mood and Affect: Mood normal.        Behavior: Behavior normal.     Wt Readings from Last 3 Encounters:  08/10/23 160 lb (72.6 kg)  07/15/23 161 lb (73 kg)  06/15/23 160 lb 3.2 oz (72.7 kg)    BP 124/78   Pulse 90   Temp 97.8 F (36.6 C) (Oral)   Ht 5\' 1"  (1.549 m)  Wt 160 lb (72.6 kg)   SpO2 94%   BMI 30.23 kg/m   Assessment and Plan:  Problem List Items Addressed This Visit   None Visit Diagnoses     Rib pain on left side    -  Primary   rule out rib fracture vs muscular strain use splinting and analgesics   Relevant Orders   DG Ribs Unilateral Left   Cough in adult       persistent  since treatment with antibiotics 3 weeks ago will get CXR to rule out PNA   Relevant Medications   amoxicillin-clavulanate (AUGMENTIN) 875-125 MG tablet   guaiFENesin-codeine 100-10 MG/5ML syrup   predniSONE (DELTASONE) 10 MG tablet   Other Relevant Orders   DG Chest 2 View       No follow-ups on file.    Reubin Milan, MD [facility] Primary Care and Sports Medicine [place]

## 2023-08-18 ENCOUNTER — Encounter: Payer: Self-pay | Admitting: Family Medicine

## 2023-08-18 ENCOUNTER — Ambulatory Visit (INDEPENDENT_AMBULATORY_CARE_PROVIDER_SITE_OTHER): Payer: Medicare PPO | Admitting: Family Medicine

## 2023-08-18 ENCOUNTER — Encounter: Payer: Self-pay | Admitting: Internal Medicine

## 2023-08-18 ENCOUNTER — Ambulatory Visit: Payer: Self-pay | Admitting: *Deleted

## 2023-08-18 VITALS — BP 180/120 | HR 87 | Ht 61.0 in | Wt 159.2 lb

## 2023-08-18 DIAGNOSIS — R052 Subacute cough: Secondary | ICD-10-CM | POA: Insufficient documentation

## 2023-08-18 MED ORDER — PROMETHAZINE-DM 6.25-15 MG/5ML PO SYRP: 5.0000 mL | ORAL_SOLUTION | Freq: Four times a day (QID) | ORAL | 0 refills | Status: DC | PRN

## 2023-08-18 MED ORDER — IPRATROPIUM-ALBUTEROL 0.5-2.5 (3) MG/3ML IN SOLN: 3.0000 mL | RESPIRATORY_TRACT | 0 refills | Status: AC | PRN

## 2023-08-18 NOTE — Progress Notes (Signed)
Primary Care / Sports Medicine Office Visit  Patient Information:  Patient ID: Tiffany Bonilla, female DOB: [birth year] Age: 67 y.o. MRN: [id number]   Tiffany Bonilla is a pleasant 67 y.o. female presenting with the following:  Chief Complaint  Patient presents with   Cough    Cough x 2 months had pneumonia but continues to have really bad cough and the cough is productive. NO color to mucous.     Vitals:   08/18/23 1543 08/18/23 1733  BP: (!) 180/120   Pulse: 87   SpO2: 95% 98%   Vitals:   08/18/23 1543  Weight: 159 lb 3.2 oz (72.2 kg)  Height: 5\' 1"  (1.549 m)   Body mass index is 30.08 kg/m.  DG Ribs Unilateral Left Result Date: 08/10/2023 CLINICAL DATA:  Left rib pain. EXAM: LEFT RIBS - 2 VIEW COMPARISON:  Chest x-ray dated September 11, 2022. FINDINGS: No fracture or other bone lesions are seen involving the ribs. IMPRESSION: Negative. Electronically Signed   By: Obie Dredge M.D.   On: 08/10/2023 10:41   DG Chest 2 View Result Date: 08/10/2023 CLINICAL DATA:  Cough and shortness of breath. EXAM: CHEST - 2 VIEW COMPARISON:  Chest x-ray dated September 11, 2022. FINDINGS: The heart size and mediastinal contours are within normal limits. Both lungs are clear. The visualized skeletal structures are unremarkable. IMPRESSION: No active cardiopulmonary disease. Electronically Signed   By: Obie Dredge M.D.   On: 08/10/2023 10:40     Independent interpretation of notes and tests performed by another provider:   None  Procedures performed:   None  Pertinent History, Exam, Impression, and Recommendations:   Problem List Items Addressed This Visit       Other   Subacute cough - Primary   History of Present Illness The patient, with a history of smoking cessation one year ago, presents with a persistent cough of two months duration. Despite two courses of antibiotics (doxycycline and currently on Augmentin), a tapering dose of prednisone, and Robitussin  with codeine, the cough has not improved. The cough is associated with significant chest pain, to the point where a rib fracture was suspected. However, a recent chest x-ray did not reveal any fractures or other bone lesions. The patient also reports shortness of breath, but maintains oxygen saturation in the upper nineties (checks at home). The patient has been using albuterol inhalers and nebulizers, but these have not provided relief. The patient denies any productive cough or colored sputum.  Physical Exam VITALS: SaO2- 95 HEENT: Tympanic membranes and canals benign, nares benign.  Oral cavity without lesions or erythema. CHEST: Decreased air movement in the left lower lobe. Coarse breath sounds in right upper and lower lobes. Wheezing noted on and expiration.  DuoNeb administered VITALS: SaO2- 98 CHEST: Greatly improved equal air entry, resolution of end expiratory wheeze, mild coarse referred upper airway sounds.    Results RADIOLOGY Chest X-ray: No fracture, no bone lesions, no pneumonia (08/10/2023)  Assessment & Plan Chronic Cough Persistent cough for two months despite treatment with prednisone, Robitussin with codeine, and two courses of antibiotics (Doxycycline and Augmentin). Chest X-ray on 08/10/2023 showed no pneumonia or rib fractures. Auscultation revealed asymmetrical air movement with coarse breath sounds and wheezing, overall presentation suggestive of bronchitis. -Administer DuoNeb nebulizer treatment today with improvement in lung sounds post-treatment. -Consider adding a mucolytic such as guaifenesin to thin secretions. -If no improvement, consider referral to a pulmonologist for further evaluation.  Relevant Medications   ipratropium-albuterol (DUONEB) 0.5-2.5 (3) MG/3ML SOLN   promethazine-dextromethorphan (PROMETHAZINE-DM) 6.25-15 MG/5ML syrup   I provided a total time of 44 minutes including both face-to-face and non-face-to-face time on 08/18/2023 inclusive  of time utilized for medical chart review, information gathering, care coordination with staff, and documentation completion.   Orders & Medications Medications:  Meds ordered this encounter  Medications   ipratropium-albuterol (DUONEB) 0.5-2.5 (3) MG/3ML SOLN    Sig: Take 3 mLs by nebulization every 4 (four) hours as needed.    Dispense:  120 mL    Refill:  0    Please dispense #120 vials   promethazine-dextromethorphan (PROMETHAZINE-DM) 6.25-15 MG/5ML syrup    Sig: Take 5 mLs by mouth 4 (four) times daily as needed for cough.    Dispense:  118 mL    Refill:  0   No orders of the defined types were placed in this encounter.    No follow-ups on file.     Jerrol Banana, MD, [facility]   Primary Care Sports Medicine Primary Care and Sports Medicine at [facility]

## 2023-08-18 NOTE — Telephone Encounter (Signed)
  Chief Complaint: Shortness of breath for 2 months and getting worse.   Been on 2 rounds of antibiotics, breathing treatments, etc.   Has COPD and asthma. Symptoms: Coughing so much she has pulled a muscle and is wearing a Lidocaine patch without much relief.   Coughing up clear mucus.   Fever yesterday 101.  Very weak.  Coughing a lot during call.   Talking in phrases. Frequency: Over the last 2 months. Pertinent Negatives: Patient denies getting any better since this started 2 months ago.   I'm getting worse and am so weak. Disposition: [] ED /[] Urgent Care (no appt availability in office) / [x] Appointment(In office/virtual)/ []  [place] [facility]/ [] Home Care/ [] Refused Recommended Disposition /[] [place] [facility]/ []  Follow-up with PCP Additional Notes: No appts until 08/19/2023 with Dr. Judithann Graves so sending a high priority message to see if she can possibly been seen today.   Pt. Agreeable to this plan.

## 2023-08-18 NOTE — Assessment & Plan Note (Signed)
History of Present Illness The patient, with a history of smoking cessation one year ago, presents with a persistent cough of two months duration. Despite two courses of antibiotics (doxycycline and currently on Augmentin), a tapering dose of prednisone, and Robitussin with codeine, the cough has not improved. The cough is associated with significant chest pain, to the point where a rib fracture was suspected. However, a recent chest x-ray did not reveal any fractures or other bone lesions. The patient also reports shortness of breath, but maintains oxygen saturation in the upper nineties (checks at home). The patient has been using albuterol inhalers and nebulizers, but these have not provided relief. The patient denies any productive cough or colored sputum.  Physical Exam VITALS: SaO2- 95 HEENT: Tympanic membranes and canals benign, nares benign.  Oral cavity without lesions or erythema. CHEST: Decreased air movement in the left lower lobe. Coarse breath sounds in right upper and lower lobes. Wheezing noted on and expiration.  DuoNeb administered VITALS: SaO2- 98 CHEST: Greatly improved equal air entry, resolution of end expiratory wheeze, mild coarse referred upper airway sounds.    Results RADIOLOGY Chest X-ray: No fracture, no bone lesions, no pneumonia (08/10/2023)  Assessment & Plan Chronic Cough Persistent cough for two months despite treatment with prednisone, Robitussin with codeine, and two courses of antibiotics (Doxycycline and Augmentin). Chest X-ray on 08/10/2023 showed no pneumonia or rib fractures. Auscultation revealed asymmetrical air movement with coarse breath sounds and wheezing, overall presentation suggestive of bronchitis. -Administer DuoNeb nebulizer treatment today with improvement in lung sounds post-treatment. -Consider adding a mucolytic such as guaifenesin to thin secretions. -If no improvement, consider referral to a pulmonologist for further evaluation.

## 2023-08-18 NOTE — Patient Instructions (Signed)
-   Finish out antibiotics - Use DuoNeb every 4 hours while awake - Start Mucinex 12-hour twice daily -Increase water intake - Can use Rx cough syrup as needed - Contact for any persistent symptoms and follow-up as needed

## 2023-08-18 NOTE — Telephone Encounter (Signed)
Patient will be seen today with Mordecai Maes.

## 2023-08-18 NOTE — Telephone Encounter (Signed)
Reason for Disposition  [1] Longstanding difficulty breathing AND [2] not responding to usual therapy  Answer Assessment - Initial Assessment Questions 1. RESPIRATORY STATUS: "Describe your breathing?" (e.g., wheezing, short I'm having shortness of breath for over a month.   I've been on 2 antibiotics and nothing is helping.    I'm coughing so much.   They said I had a pulled muscle and no broken ribs.  Very short  of breath, unable to speak, severe coughing)      I'm coughing up clear mucus but I'm coughing so much.   I have a fever on and off.   101 was highest yesterday.   I've not felt good at all.   I'm so weak.    2. ONSET: "When did this breathing problem begin?"      I'm not getting better.   Prednisone and breathing treatments not helping. 3. PATTERN "Does the difficult breathing come and go, or has it been constant since it started?"      Constant and getting worse. 4. SEVERITY: "How bad is your breathing?" (e.g., mild, moderate, severe)    - MILD: No SOB at rest, mild SOB with walking, speaks normally in sentences, can lie down, no retractions, pulse < 100.    - MODERATE: SOB at rest, SOB with minimal exertion and prefers to sit, cannot lie down flat, speaks in phrases, mild retractions, audible wheezing, pulse 100-120.    - SEVERE: Very SOB at rest, speaks in single words, struggling to breathe, sitting hunched forward, retractions, pulse > 120      Moderate 5. RECURRENT SYMPTOM: "Have you had difficulty breathing before?" If Yes, ask: "When was the last time?" and "What happened that time?"      Yes for 2 months 6. CARDIAC HISTORY: "Do you have any history of heart disease?" (e.g., heart attack, angina, bypass surgery, angioplasty)      Not asked 7. LUNG HISTORY: "Do you have any history of lung disease?"  (e.g., pulmonary embolus, asthma, emphysema)     I do have COPD and asthma.   I have an inhaler and it's not working. 8. CAUSE: "What do you think is causing the breathing  problem?"      I'm no better and getting worse. 9. OTHER SYMPTOMS: "Do you have any other symptoms? (e.g., dizziness, runny nose, cough, chest pain, fever)     Fever and coughing 10. O2 SATURATION MONITOR:  "Do you use an oxygen saturation monitor (pulse oximeter) at home?" If Yes, ask: "What is your reading (oxygen level) today?" "What is your usual oxygen saturation reading?" (e.g., 95%)       Not asked 11. PREGNANCY: "Is there any chance you are pregnant?" "When was your last menstrual period?"       N/A due to age 67. TRAVEL: "Have you traveled out of the country in the last month?" (e.g., travel history, exposures)       Not asked  Protocols used: Breathing Difficulty-A-AH

## 2023-08-19 ENCOUNTER — Ambulatory Visit: Payer: Medicare PPO | Admitting: Physician Assistant

## 2023-09-30 DIAGNOSIS — K029 Dental caries, unspecified: Secondary | ICD-10-CM | POA: Diagnosis not present

## 2023-10-07 ENCOUNTER — Inpatient Hospital Stay: Admission: RE | Admit: 2023-10-07 | Payer: Medicare PPO | Source: Ambulatory Visit

## 2023-10-23 ENCOUNTER — Other Ambulatory Visit: Payer: Self-pay

## 2023-10-23 DIAGNOSIS — Z87891 Personal history of nicotine dependence: Secondary | ICD-10-CM

## 2023-10-23 DIAGNOSIS — Z122 Encounter for screening for malignant neoplasm of respiratory organs: Secondary | ICD-10-CM

## 2023-11-02 ENCOUNTER — Ambulatory Visit
Admission: EM | Admit: 2023-11-02 | Discharge: 2023-11-02 | Disposition: A | Discharge status: Home / Self Care | Attending: Emergency Medicine | Admitting: Emergency Medicine

## 2023-11-02 DIAGNOSIS — N390 Urinary tract infection, site not specified: Secondary | ICD-10-CM

## 2023-11-02 DIAGNOSIS — I10 Essential (primary) hypertension: Secondary | ICD-10-CM | POA: Diagnosis present

## 2023-11-02 DIAGNOSIS — B3731 Acute candidiasis of vulva and vagina: Secondary | ICD-10-CM

## 2023-11-02 MED ORDER — FLUCONAZOLE 150 MG PO TABS: 150.0000 mg | ORAL_TABLET | ORAL | 0 refills | Status: AC

## 2023-11-02 MED ORDER — NITROFURANTOIN MONOHYD MACRO 100 MG PO CAPS: 100.0000 mg | ORAL_CAPSULE | Freq: Two times a day (BID) | ORAL | 0 refills | Status: DC

## 2023-11-02 MED ORDER — PHENAZOPYRIDINE HCL 200 MG PO TABS: 200.0000 mg | ORAL_TABLET | Freq: Three times a day (TID) | ORAL | 0 refills | Status: DC

## 2023-11-02 NOTE — ED Provider Notes (Signed)
 [facility]    CSN: [id number] Arrival date & time: 11/02/23  1712      History   Chief Complaint Chief Complaint  Patient presents with   Back Pain   Abdominal Pain   Urinary Frequency    HPI Tiffany Bonilla is a 68 y.o. female.   HPI  68 year old female with past medical history significant for hypertension, SVT, CVA, spina bifida occulta, prediabetes, hyperlipidemia, and asthma presents for evaluation of 3 days worth of lower abdominal pain, low back pain, and spasms at the end of urination.  She denies any fever, blood in her urine, or vomiting.  She does endorse nausea and cloudiness to her urine.  She denies any vaginal itching or discharge.  Patient is also hypertensive with a blood pressure of 204/99.  She reports that she has taken her blood pressure medication today.  She reports that she does monitor her blood pressure at home and it will occasionally run this high but she has not talked to her PCP about this.  She denies any headache, chest pain, dizziness, or shortness of breath.  Past Medical History:  Diagnosis Date   Allergy    Asthma    COVID 07/07/2022   Hyperlipidemia    Hypertension    Pre-diabetes 11/24/2018   Spina bifida occulta    Stroke [facility])    SVT (supraventricular tachycardia) (HCC) 11/24/2018   S/p ablation at [facility]   Wears dentures    full upper    Patient Active Problem List   Diagnosis Date Noted   Subacute cough 08/18/2023   Chronic ischemic left middle cerebral artery (MCA) stroke 09/22/2022   Carotid artery thrombosis, left 09/13/2022   Centrilobular emphysema (HCC) 04/24/2022   Polyp of descending colon    Chronic midline low back pain without sciatica 06/19/2021   Chronic kidney disease, stage 3b (HCC) 04/19/2021   Atrophic kidney 09/27/2020   Aortic aneurysm, abdominal (HCC) 09/27/2020   Benign essential tremor 04/09/2020   Vitamin D deficiency 09/06/2019   Other specified anxiety disorders 02/16/2019   Lichen  sclerosus et atrophicus of the vulva 11/25/2018   Essential hypertension 11/24/2018   Old lacunar stroke without late effect 11/24/2018   Tobacco abuse, in remission 10/25/2015   Hx of hepatitis C 10/25/2015   CAD in native artery 05/24/2015   Mixed hyperlipidemia 06/12/2011    Past Surgical History:  Procedure Laterality Date   CARDIAC ELECTROPHYSIOLOGY STUDY AND ABLATION  2006   for SVT   CATARACT EXTRACTION W/PHACO Right 02/06/2021   Procedure: CATARACT EXTRACTION PHACO AND INTRAOCULAR LENS PLACEMENT (IOC) RIGHT;  Surgeon: Lockie Mola, MD;  Location: [facility];  Service: Ophthalmology;  Laterality: Right;  2.85 0:32.6 8.7%   CATARACT EXTRACTION W/PHACO Left 02/20/2021   Procedure: CATARACT EXTRACTION PHACO AND INTRAOCULAR LENS PLACEMENT (IOC) LEFT;  Surgeon: Lockie Mola, MD;  Location: [facility];  Service: Ophthalmology;  Laterality: Left;  3.12 0:39.5   COLONOSCOPY  2006   COLONOSCOPY WITH PROPOFOL N/A 10/25/2021   Procedure: COLONOSCOPY WITH BIOPSY;  Surgeon: Midge Minium, MD;  Location: [facility];  Service: Endoscopy;  Laterality: N/A;   CORONARY ANGIOPLASTY WITH STENT PLACEMENT  2001   to Circumfx   ESOPHAGOGASTRODUODENOSCOPY  2017   LAPAROSCOPIC CHOLECYSTECTOMY  04/25/2019   @ [facility]   LAPAROSCOPIC GASTRIC SLEEVE RESECTION  11/07/2015   POLYPECTOMY N/A 10/25/2021   Procedure: POLYPECTOMY;  Surgeon: Midge Minium, MD;  Location: [facility];  Service: Endoscopy;  Laterality: N/A;  TOTAL ABDOMINAL HYSTERECTOMY  1992   CIS cervix    OB History   No obstetric history on file.      Home Medications    Prior to Admission medications   Medication Sig Start Date End Date Taking? Authorizing Provider  albuterol (PROVENTIL) (2.5 MG/3ML) 0.083% nebulizer solution Take 3 mLs (2.5 mg total) by nebulization every 6 (six) hours as needed for wheezing or shortness of breath. 07/15/23  Yes Reubin Milan, MD  albuterol (VENTOLIN  HFA) 108 (90 Base) MCG/ACT inhaler Inhale 2 puffs into the lungs every 6 (six) hours as needed for wheezing or shortness of breath. 02/24/23  Yes Reubin Milan, MD  aspirin EC 81 MG tablet Take 1 tablet by mouth daily.   Yes [provider]  cholecalciferol (VITAMIN D3) 25 MCG (1000 UNIT) tablet Take 2,000 Units by mouth daily.   Yes [provider]  clobetasol cream (TEMOVATE) 0.05 % APPLY TOPICALLY TO THE AFFECTED AREA TWICE DAILY 08/07/22  Yes Reubin Milan, MD  escitalopram (LEXAPRO) 20 MG tablet TAKE 1 TABLET(20 MG) BY MOUTH DAILY 05/17/23  Yes Reubin Milan, MD  Evolocumab (REPATHA) 140 MG/ML SOSY Inject 140 mg into the skin every 14 (fourteen) days. 06/15/23  Yes Reubin Milan, MD  fluconazole (DIFLUCAN) 150 MG tablet Take 1 tablet (150 mg total) by mouth every 3 (three) days for 3 doses. 11/02/23 11/09/23 Yes Becky Augusta, NP  gabapentin (NEURONTIN) 100 MG capsule TAKE 2 CAPSULES(200 MG) BY MOUTH TWICE DAILY 05/11/23  Yes Reubin Milan, MD  lisinopril (ZESTRIL) 20 MG tablet Take 1 tablet (20 mg total) by mouth daily. TAKE 1 TABLET(10 MG) BY MOUTH DAILY 06/15/23  Yes Reubin Milan, MD  Melatonin 10 MG TABS Take 1 tablet by mouth at bedtime.   Yes [provider]  Multiple Vitamins-Minerals (ALIVE WOMENS ENERGY) TABS Take by mouth.   Yes [provider]  nitrofurantoin, macrocrystal-monohydrate, (MACROBID) 100 MG capsule Take 1 capsule (100 mg total) by mouth 2 (two) times daily. 11/02/23  Yes Becky Augusta, NP  phenazopyridine (PYRIDIUM) 200 MG tablet Take 1 tablet (200 mg total) by mouth 3 (three) times daily. 11/02/23  Yes Becky Augusta, NP  rosuvastatin (CRESTOR) 40 MG tablet TAKE 1 TABLET(40 MG) BY MOUTH DAILY 06/08/23  Yes Reubin Milan, MD  Vitamin D, Ergocalciferol, (DRISDOL) 1.25 MG (50000 UNIT) CAPS capsule TAKE 1 CAPSULE BY MOUTH EVERY 7 DAYS 06/08/23  Yes Reubin Milan, MD  ipratropium-albuterol (DUONEB) 0.5-2.5 (3) MG/3ML SOLN Take  3 mLs by nebulization every 4 (four) hours as needed. 08/18/23   Jerrol Banana, MD  promethazine-dextromethorphan (PROMETHAZINE-DM) 6.25-15 MG/5ML syrup Take 5 mLs by mouth 4 (four) times daily as needed for cough. 08/18/23   Jerrol Banana, MD    Family History Family History  Problem Relation Age of Onset   Aneurysm Mother    Breast cancer Maternal Aunt     Social History Social History   Tobacco Use   Smoking status: Former    Current packs/day: 0.00    Average packs/day: 0.5 packs/day for 44.0 years (22.0 ttl pk-yrs)    Types: Cigarettes    Start date: 80    Quit date: 09/13/2022    Years since quitting: 1.1   Smokeless tobacco: Never  Vaping Use   Vaping status: Never Used  Substance Use Topics   Alcohol use: Not Currently    Comment: rare   Drug use: Never     Allergies  Morphine, Scopolamine, Codeine, and Sulfa antibiotics   Review of Systems Review of Systems  Constitutional:  Negative for fever.  Eyes:  Negative for visual disturbance.  Respiratory:  Negative for shortness of breath.   Cardiovascular:  Negative for chest pain.  Gastrointestinal:  Positive for abdominal pain.  Genitourinary:  Positive for dysuria. Negative for frequency, hematuria, urgency, vaginal bleeding, vaginal discharge and vaginal pain.  Musculoskeletal:  Positive for back pain.  Neurological:  Negative for dizziness and headaches.     Physical Exam Triage Vital Signs ED Triage Vitals  Encounter Vitals Group     BP      Systolic BP Percentile      Diastolic BP Percentile      Pulse      Resp      Temp      Temp src      SpO2      Weight      Height      Head Circumference      Peak Flow      Pain Score      Pain Loc      Pain Education      Exclude from Growth Chart    No data found.  Updated Vital Signs BP (!) 204/99 (BP Location: Left Arm)   Pulse 71   Temp 98.5 F (36.9 C) (Oral)   Resp 16   SpO2 98%   Visual Acuity Right Eye Distance:   Left  Eye Distance:   Bilateral Distance:    Right Eye Near:   Left Eye Near:    Bilateral Near:     Physical Exam Vitals and nursing note reviewed.  Constitutional:      Appearance: Normal appearance. She is not ill-appearing.  HENT:     Head: Normocephalic and atraumatic.  Cardiovascular:     Rate and Rhythm: Normal rate and regular rhythm.     Pulses: Normal pulses.     Heart sounds: Normal heart sounds. No murmur heard.    No friction rub. No gallop.  Pulmonary:     Effort: Pulmonary effort is normal.     Breath sounds: Normal breath sounds. No wheezing, rhonchi or rales.  Abdominal:     General: Abdomen is flat.     Palpations: Abdomen is soft.     Tenderness: There is no abdominal tenderness. There is no right CVA tenderness, left CVA tenderness, guarding or rebound.  Skin:    General: Skin is warm and dry.     Capillary Refill: Capillary refill takes less than 2 seconds.  Neurological:     General: No focal deficit present.     Mental Status: She is alert and oriented to person, place, and time.      UC Treatments / Results  Labs (all labs ordered are listed, but only abnormal results are displayed) Labs Reviewed  URINALYSIS, W/ REFLEX TO CULTURE (INFECTION SUSPECTED) - Abnormal; Notable for the following components:      Result Value   APPearance CLOUDY (*)    Specific Gravity, Urine >1.030 (*)    Hgb urine dipstick MODERATE (*)    Ketones, ur TRACE (*)    Protein, ur 100 (*)    Leukocytes,Ua SMALL (*)    Bacteria, UA MANY (*)    All other components within normal limits  URINE CULTURE    EKG   Radiology No results found.  Procedures Procedures (including critical care time)  Medications Ordered in UC Medications - No  data to display  Initial Impression / Assessment and Plan / UC Course  I have reviewed the triage vital signs and the nursing notes.  Pertinent labs & imaging results that were available during my care of the patient were reviewed by  me and considered in my medical decision making (see chart for details).   Patient is a nontoxic-appearing 67 year old female presenting for evaluation of UTI symptoms outlined HPI above.  She denies any pain with urination or urgency or frequency but she does endorse significant bladder spasms at the end of urination coupled with suprapubic pain and low back pain.  Denies any vaginal symptoms.  I will order urinalysis to evaluate presence of UTI.  She also has an elevated blood pressure in clinic and reports that she is taking her antihypertensives and took them today.  Her blood pressure has been running high at home but she has not talked to her PCP about this.  She is denying any headache, change in vision, dizziness, chest pain, or shortness of breath.  I have advised her that she needs to follow-up with her PCP as she may need to have a dosage adjustment to her antihypertensives.  Urinalysis shows cloudy appearance with a high specific gravity, moderate hemoglobin, trace ketones, 100 protein, and small leukocyte esterase.  Negative for nitrates.  Reflex microscopy shows greater than 50 WBCs, 21-50 RBCs, many bacteria, and budding yeast.  Urine will reflex to culture.  I will discharge patient on the diagnosis of urinary tract infection and start her on Macrobid 100 mg twice daily for 5 days for treatment of her UTI.  Also Pyridium to help with his urinary discomfort.  Additionally, I will prescribe Diflucan 150 mg tablets, 1 tablet now and repeat dosing every 3 days for total of 3 doses for yeast infection found on her urinalysis.  Return precautions reviewed.   Final Clinical Impressions(s) / UC Diagnoses   Final diagnoses:  Lower urinary tract infectious disease  Yeast infection involving the vagina and surrounding area  Essential hypertension     Discharge Instructions      Take the Macrobid twice daily for 5 days with food for treatment of urinary tract infection.  Use the Pyridium  every 8 hours as needed for urinary discomfort.  This will turn your urine a bright red-orange.  Increase your oral fluid intake so that you increase your urine production and or flushing your urinary system.  Take an over-the-counter probiotic, such as Culturelle-Align-Activia, 1 hour after each dose of antibiotic to prevent diarrhea or yeast infections from forming.  We will culture urine and change the antibiotics if necessary.  Yeast was also found on urine microscopic examination of your urine so we will going to treat your developing yeast infection with Diflucan.  Take 1 tablet now and repeat dosing every 3 days for total of 3 doses.  With regards to your blood pressure and you need to make an appointment to follow-up with your primary care provider, take the readings you have been checking at home with you to that appointment, and discuss the need for dosage adjustment for your antihypertensives.  If you develop any headache, changes in vision, dizziness, nausea or vomiting, chest pain, shortness of breath you need to call 911 and go to the ER.  Return for reevaluation, or see your primary care provider, for any new or worsening symptoms.      ED Prescriptions     Medication Sig Dispense Auth. Provider   nitrofurantoin, macrocrystal-monohydrate, (  MACROBID) 100 MG capsule Take 1 capsule (100 mg total) by mouth 2 (two) times daily. 10 capsule Becky Augusta, NP   phenazopyridine (PYRIDIUM) 200 MG tablet Take 1 tablet (200 mg total) by mouth 3 (three) times daily. 6 tablet Becky Augusta, NP   fluconazole (DIFLUCAN) 150 MG tablet Take 1 tablet (150 mg total) by mouth every 3 (three) days for 3 doses. 3 tablet Becky Augusta, NP      PDMP not reviewed this encounter.   Becky Augusta, NP 11/02/23 [phone]

## 2023-11-02 NOTE — Discharge Instructions (Addendum)
 Take the Macrobid twice daily for 5 days with food for treatment of urinary tract infection.  Use the Pyridium every 8 hours as needed for urinary discomfort.  This will turn your urine a bright red-orange.  Increase your oral fluid intake so that you increase your urine production and or flushing your urinary system.  Take an over-the-counter probiotic, such as Culturelle-Align-Activia, 1 hour after each dose of antibiotic to prevent diarrhea or yeast infections from forming.  We will culture urine and change the antibiotics if necessary.  Yeast was also found on urine microscopic examination of your urine so we will going to treat your developing yeast infection with Diflucan.  Take 1 tablet now and repeat dosing every 3 days for total of 3 doses.  With regards to your blood pressure and you need to make an appointment to follow-up with your primary care provider, take the readings you have been checking at home with you to that appointment, and discuss the need for dosage adjustment for your antihypertensives.  If you develop any headache, changes in vision, dizziness, nausea or vomiting, chest pain, shortness of breath you need to call 911 and go to the ER.  Return for reevaluation, or see your primary care provider, for any new or worsening symptoms.

## 2023-11-02 NOTE — ED Triage Notes (Signed)
 Sx x 3 days  Lower abdominal pain-back pain- burning when finishing

## 2023-11-05 LAB — URINALYSIS, W/ REFLEX TO CULTURE (INFECTION SUSPECTED)
Bilirubin Urine: NEGATIVE
Glucose, UA: NEGATIVE mg/dL
Nitrite: NEGATIVE
Protein, ur: 100 mg/dL — AB
Specific Gravity, Urine: >1.03 — ABNORMAL HIGH (ref 1.005–1.030)
WBC, UA: >50 WBC/hpf (ref 0–5)
pH: 6 (ref 5.0–8.0)

## 2023-11-05 LAB — URINE CULTURE: Culture: 10000 — AB

## 2023-11-10 ENCOUNTER — Ambulatory Visit: Payer: Medicare HMO

## 2023-11-11 ENCOUNTER — Ambulatory Visit
Admission: RE | Admit: 2023-11-11 | Discharge: 2023-11-11 | Disposition: A | Discharge status: Home / Self Care | Source: Ambulatory Visit | Attending: Acute Care | Admitting: Acute Care

## 2023-11-11 ENCOUNTER — Other Ambulatory Visit: Payer: Self-pay | Admitting: Internal Medicine

## 2023-11-11 DIAGNOSIS — I10 Essential (primary) hypertension: Secondary | ICD-10-CM

## 2023-11-11 DIAGNOSIS — Z122 Encounter for screening for malignant neoplasm of respiratory organs: Secondary | ICD-10-CM | POA: Diagnosis present

## 2023-11-11 DIAGNOSIS — Z87891 Personal history of nicotine dependence: Secondary | ICD-10-CM | POA: Insufficient documentation

## 2023-11-11 DIAGNOSIS — G250 Essential tremor: Secondary | ICD-10-CM

## 2023-11-11 NOTE — Telephone Encounter (Signed)
 Requested Prescriptions  Pending Prescriptions Disp Refills   gabapentin (NEURONTIN) 100 MG capsule [Pharmacy Med Name: GABAPENTIN 100MG  CAPSULES] 360 capsule 0    Sig: TAKE 2 CAPSULES(200 MG) BY MOUTH TWICE DAILY     Neurology: Anticonvulsants - gabapentin Failed - 11/11/2023  3:58 PM      Failed - Cr in normal range and within 360 days    Creatinine, Ser  Date Value Ref Range Status  06/15/2023 1.11 (H) 0.57 - 1.00 mg/dL Final         Passed - Completed PHQ-2 or PHQ-9 in the last 360 days      Passed - Valid encounter within last 12 months    Recent Outpatient Visits           2 months ago Subacute cough   [place] [facility], Ocie Bob, MD   3 months ago Rib pain on left side   [facility] at [facility], Nyoka Cowden, MD   3 months ago Bronchitis   [place] [facility] at [facility], Nyoka Cowden, MD   4 months ago CAD in native artery   [facility] at [facility], Nyoka Cowden, MD   8 months ago Upper respiratory tract infection, unspecified type   [place] [facility] at [facility], Nyoka Cowden, MD       Future Appointments             In 1 month Judithann Graves, Nyoka Cowden, MD [facility] at [facility], [facility]             lisinopril (ZESTRIL) 10 MG tablet [Pharmacy Med Name: LISINOPRIL 10MG  TABLETS] 90 tablet 0    Sig: TAKE 1 TABLET(10 MG) BY MOUTH DAILY     Cardiovascular:  ACE Inhibitors Failed - 11/11/2023  3:58 PM      Failed - Cr in normal range and within 180 days    Creatinine, Ser  Date Value Ref Range Status  06/15/2023 1.11 (H) 0.57 - 1.00 mg/dL Final         Failed - Last BP in normal range    BP Readings from Last 1 Encounters:  11/02/23 (!) 204/99         Passed - K in normal range and within 180 days    Potassium  Date  Value Ref Range Status  06/15/2023 4.0 3.5 - 5.2 mmol/L Final         Passed - Patient is not pregnant      Passed - Valid encounter within last 6 months    Recent Outpatient Visits           2 months ago Subacute cough   [place] [facility], Ocie Bob, MD   3 months ago Rib pain on left side   [facility] at [facility], Nyoka Cowden, MD   3 months ago Bronchitis   [facility] Medicine at [facility], Nyoka Cowden, MD   4 months ago CAD in native artery   [facility] at [facility], Nyoka Cowden, MD   8 months ago Upper respiratory tract infection, unspecified type   [facility], [facility]       Future Appointments             In 1 month Judithann Graves Nyoka Cowden, MD [facility] & [facility] Medicine at [facility], [facility]

## 2023-11-25 ENCOUNTER — Inpatient Hospital Stay: Admission: RE | Admit: 2023-11-25 | Payer: Self-pay | Source: Ambulatory Visit

## 2023-12-07 ENCOUNTER — Telehealth: Payer: Self-pay

## 2023-12-07 NOTE — Telephone Encounter (Signed)
 Called patient to review results. LVM and requested call back. Per EMR patient is followed by Dr. Judithann Graves. She is on statin therapy.   IMPRESSION: 1. Lung-RADS 2, benign appearance or behavior. Continue annual screening with low-dose chest CT without contrast in 12 months. 2. Three-vessel coronary atherosclerosis. 3. Chronic severe asymmetric left renal atrophy. 4. Aortic Atherosclerosis (ICD10-I70.0) and Emphysema (ICD10-J43.9).

## 2023-12-08 ENCOUNTER — Other Ambulatory Visit: Payer: Self-pay

## 2023-12-08 DIAGNOSIS — Z87891 Personal history of nicotine dependence: Secondary | ICD-10-CM

## 2023-12-08 DIAGNOSIS — Z122 Encounter for screening for malignant neoplasm of respiratory organs: Secondary | ICD-10-CM

## 2023-12-08 NOTE — Telephone Encounter (Signed)
 Letter sent to [facility] and to Dr. Judithann Graves for f/u if warranted.

## 2023-12-14 ENCOUNTER — Ambulatory Visit: Payer: Self-pay | Admitting: Internal Medicine

## 2023-12-16 ENCOUNTER — Ambulatory Visit: Admitting: Internal Medicine

## 2023-12-22 ENCOUNTER — Other Ambulatory Visit: Payer: Self-pay | Admitting: Internal Medicine

## 2023-12-22 DIAGNOSIS — I10 Essential (primary) hypertension: Secondary | ICD-10-CM

## 2023-12-23 ENCOUNTER — Other Ambulatory Visit: Payer: Self-pay | Admitting: Internal Medicine

## 2023-12-23 ENCOUNTER — Ambulatory Visit (INDEPENDENT_AMBULATORY_CARE_PROVIDER_SITE_OTHER): Admitting: Internal Medicine

## 2023-12-23 ENCOUNTER — Other Ambulatory Visit: Payer: Self-pay

## 2023-12-23 VITALS — BP 150/86 | HR 85 | Ht 61.0 in | Wt 156.1 lb

## 2023-12-23 DIAGNOSIS — E559 Vitamin D deficiency, unspecified: Secondary | ICD-10-CM

## 2023-12-23 DIAGNOSIS — N1832 Chronic kidney disease, stage 3b: Secondary | ICD-10-CM

## 2023-12-23 DIAGNOSIS — G250 Essential tremor: Secondary | ICD-10-CM

## 2023-12-23 DIAGNOSIS — J432 Centrilobular emphysema: Secondary | ICD-10-CM

## 2023-12-23 DIAGNOSIS — I10 Essential (primary) hypertension: Secondary | ICD-10-CM

## 2023-12-23 DIAGNOSIS — N3001 Acute cystitis with hematuria: Secondary | ICD-10-CM | POA: Diagnosis not present

## 2023-12-23 DIAGNOSIS — E782 Mixed hyperlipidemia: Secondary | ICD-10-CM

## 2023-12-23 DIAGNOSIS — B353 Tinea pedis: Secondary | ICD-10-CM

## 2023-12-23 LAB — POCT URINALYSIS DIPSTICK
Bilirubin, UA: NEGATIVE
Blood, UA: POSITIVE — AB
Glucose, UA: NEGATIVE
Ketones, UA: NEGATIVE
Nitrite, UA: POSITIVE — AB
Protein, UA: POSITIVE — AB
Spec Grav, UA: 1.01 (ref 1.010–1.025)
Urobilinogen, UA: 0.2 U/dL
pH, UA: 6 (ref 5.0–8.0)

## 2023-12-23 MED ORDER — AMLODIPINE BESYLATE 5 MG PO TABS: 5.0000 mg | ORAL_TABLET | Freq: Every day | ORAL | 1 refills | Status: DC

## 2023-12-23 MED ORDER — NITROFURANTOIN MONOHYD MACRO 100 MG PO CAPS: 100.0000 mg | ORAL_CAPSULE | Freq: Two times a day (BID) | ORAL | 0 refills | Status: AC

## 2023-12-23 MED ORDER — KETOCONAZOLE 2 % EX CREA: 1.0000 | TOPICAL_CREAM | Freq: Every day | CUTANEOUS | 0 refills | Status: DC

## 2023-12-23 NOTE — Telephone Encounter (Signed)
 Requested medication (s) are due for refill today - yes  Requested medication (s) are on the active medication list -yes  Future visit scheduled -today  Last refill: 2 doses listed on medication list- not sure if patient is to be taking both- sent for review :                   Lisinopril  10mg  #90 11/11/23, 20mg  #90 1RF  Notes to clinic: please review dosing   Requested Prescriptions  Pending Prescriptions Disp Refills   lisinopril  (ZESTRIL ) 20 MG tablet [Pharmacy Med Name: LISINOPRIL  20MG  TABLETS] 90 tablet 1    Sig: TAKE 1 TABLET BY MOUTH DAILY     Cardiovascular:  ACE Inhibitors Failed - 12/23/2023  9:39 AM      Failed - Cr in normal range and within 180 days    Creatinine, Ser  Date Value Ref Range Status  06/15/2023 1.11 (H) 0.57 - 1.00 mg/dL Final         Failed - K in normal range and within 180 days    Potassium  Date Value Ref Range Status  06/15/2023 4.0 3.5 - 5.2 mmol/L Final         Failed - Last BP in normal range    BP Readings from Last 1 Encounters:  11/02/23 (!) 204/99         Failed - Valid encounter within last 6 months    Recent Outpatient Visits   None     Future Appointments             Today Sheron Dixons, MD [facility] Medicine at [facility], [facility]            Passed - Patient is not pregnant         Requested Prescriptions  Pending Prescriptions Disp Refills   lisinopril  (ZESTRIL ) 20 MG tablet [Pharmacy Med Name: LISINOPRIL  20MG  TABLETS] 90 tablet 1    Sig: TAKE 1 TABLET BY MOUTH DAILY     Cardiovascular:  ACE Inhibitors Failed - 12/23/2023  9:39 AM      Failed - Cr in normal range and within 180 days    Creatinine, Ser  Date Value Ref Range Status  06/15/2023 1.11 (H) 0.57 - 1.00 mg/dL Final         Failed - K in normal range and within 180 days    Potassium  Date Value Ref Range Status  06/15/2023 4.0 3.5 - 5.2 mmol/L Final         Failed - Last BP in normal range    BP Readings from Last  1 Encounters:  11/02/23 (!) 204/99         Failed - Valid encounter within last 6 months    Recent Outpatient Visits   None     Future Appointments             Today Gala Jubilee, [place], MD [facility] Primary Care & Sports Medicine at [facility], [facility]            Passed - Patient is not pregnant

## 2023-12-23 NOTE — Progress Notes (Signed)
 Date:  12/23/2023   Name:  Tiffany Bonilla   DOB:  10-06-1955   MRN:  [id number]   Chief Complaint: Hypertension, Urinary Frequency (Burning with urination and bladder leaking), and Rash (Worse on the right foot, both)  Hypertension This is a chronic problem. The problem is uncontrolled. Pertinent negatives include no chest pain, headaches or shortness of breath. Past treatments include ACE inhibitors. The current treatment provides mild improvement. There are no compliance problems.  Hypertensive end-organ damage includes kidney disease, CAD/MI and CVA.  Urinary Frequency  This is a new problem. The current episode started in the past 7 days. The quality of the pain is described as burning. The pain is mild. There has been no fever. Associated symptoms include frequency. Pertinent negatives include no chills.  Rash This is a recurrent problem. The affected locations include the left foot and right foot. The rash is characterized by blistering, itchiness and peeling. She was exposed to nothing. Pertinent negatives include no fatigue, fever or shortness of breath. Past treatments include moisturizer and topical steroids. The treatment provided no relief.  Hyperlipidemia This is a chronic problem. The problem is uncontrolled (last visit but resumed Repatha ). Pertinent negatives include no chest pain or shortness of breath. Current antihyperlipidemic treatment includes statins.    Review of Systems  Constitutional:  Negative for chills, fatigue and fever.  HENT:  Negative for trouble swallowing.   Respiratory:  Negative for chest tightness, shortness of breath and wheezing.   Cardiovascular:  Negative for chest pain and leg swelling.  Gastrointestinal:  Negative for abdominal pain.  Genitourinary:  Positive for dysuria and frequency.  Musculoskeletal:  Negative for arthralgias and gait problem.  Skin:  Positive for rash (on soles of both feet).  Neurological:  Positive for tremors.  Negative for dizziness and headaches.  Psychiatric/Behavioral:  Negative for dysphoric mood and sleep disturbance. The patient is not nervous/anxious.      Lab Results  Component Value Date   NA 142 06/15/2023   K 4.0 06/15/2023   CO2 23 06/15/2023   GLUCOSE 82 06/15/2023   BUN 13 06/15/2023   CREATININE 1.11 (H) 06/15/2023   CALCIUM  9.0 06/15/2023   EGFR 54 (L) 06/15/2023   GFRNONAA 50 (L) 04/09/2020   Lab Results  Component Value Date   CHOL 219 (H) 06/15/2023   HDL 67 06/15/2023   LDLCALC 137 (H) 06/15/2023   TRIG 85 06/15/2023   CHOLHDL 3.3 06/15/2023   Lab Results  Component Value Date   TSH 1.590 06/15/2023   Lab Results  Component Value Date   HGBA1C 5.5 02/16/2019   Lab Results  Component Value Date   WBC 8.1 06/15/2023   HGB 14.7 06/15/2023   HCT 44.6 06/15/2023   MCV 91 06/15/2023   PLT 217 06/15/2023   Lab Results  Component Value Date   ALT 11 06/15/2023   AST 18 06/15/2023   ALKPHOS 67 06/15/2023   BILITOT 0.4 06/15/2023   Lab Results  Component Value Date   VD25OH 27.5 (L) 06/15/2023     Patient Active Problem List   Diagnosis Date Noted   Subacute cough 08/18/2023   Chronic ischemic left middle cerebral artery (MCA) stroke 09/22/2022   Carotid artery thrombosis, left 09/13/2022   Centrilobular emphysema (HCC) 04/24/2022   Polyp of descending colon    Chronic midline low back pain without sciatica 06/19/2021   Chronic kidney disease, stage 3b (HCC) 04/19/2021   Atrophic kidney 09/27/2020   Aortic aneurysm,  abdominal (HCC) 09/27/2020   Benign essential tremor 04/09/2020   Vitamin D  deficiency 09/06/2019   Other specified anxiety disorders 02/16/2019   Lichen sclerosus et atrophicus of the vulva 11/25/2018   Essential hypertension 11/24/2018   Old lacunar stroke without late effect 11/24/2018   Tobacco abuse, in remission 10/25/2015   Hx of hepatitis C 10/25/2015   CAD in native artery 05/24/2015   Mixed hyperlipidemia 06/12/2011     Allergies  Allergen Reactions   Morphine Hives and Shortness Of Breath    Stopped breathing    Scopolamine Other (See Comments)    Unconscious    Codeine  Nausea Only and Other (See Comments)    NAUSEA/VOMITING    Sulfa Antibiotics Hives and Nausea Only    Other Reaction: Not Assessed     Past Surgical History:  Procedure Laterality Date   CARDIAC ELECTROPHYSIOLOGY STUDY AND ABLATION  2006   for SVT   CATARACT EXTRACTION W/PHACO Right 02/06/2021   Procedure: CATARACT EXTRACTION PHACO AND INTRAOCULAR LENS PLACEMENT (IOC) RIGHT;  Surgeon: Annell Kidney, MD;  Location: [facility];  Service: Ophthalmology;  Laterality: Right;  2.85 0:32.6 8.7%   CATARACT EXTRACTION W/PHACO Left 02/20/2021   Procedure: CATARACT EXTRACTION PHACO AND INTRAOCULAR LENS PLACEMENT (IOC) LEFT;  Surgeon: Annell Kidney, MD;  Location: [facility];  Service: Ophthalmology;  Laterality: Left;  3.12 0:39.5   COLONOSCOPY  2006   COLONOSCOPY WITH PROPOFOL  N/A 10/25/2021   Procedure: COLONOSCOPY WITH BIOPSY;  Surgeon: Marnee Sink, MD;  Location: [facility];  Service: Endoscopy;  Laterality: N/A;   CORONARY ANGIOPLASTY WITH STENT PLACEMENT  2001   to Circumfx   ESOPHAGOGASTRODUODENOSCOPY  2017   LAPAROSCOPIC CHOLECYSTECTOMY  04/25/2019   @ [facility]   LAPAROSCOPIC GASTRIC SLEEVE RESECTION  11/07/2015   POLYPECTOMY N/A 10/25/2021   Procedure: POLYPECTOMY;  Surgeon: Marnee Sink, MD;  Location: [facility];  Service: Endoscopy;  Laterality: N/A;   TOTAL ABDOMINAL HYSTERECTOMY  1992   CIS cervix    Social History   Tobacco Use   Smoking status: Former    Current packs/day: 0.00    Average packs/day: 0.5 packs/day for 44.0 years (22.0 ttl pk-yrs)    Types: Cigarettes    Start date: 32    Quit date: 09/13/2022    Years since quitting: 1.2   Smokeless tobacco: Never  Vaping Use   Vaping status: Never Used  Substance Use Topics   Alcohol use: Not Currently     Comment: rare   Drug use: Never     Medication list has been reviewed and updated.  Current Meds  Medication Sig   albuterol  (PROVENTIL ) (2.5 MG/3ML) 0.083% nebulizer solution Take 3 mLs (2.5 mg total) by nebulization every 6 (six) hours as needed for wheezing or shortness of breath.   albuterol  (VENTOLIN  HFA) 108 (90 Base) MCG/ACT inhaler Inhale 2 puffs into the lungs every 6 (six) hours as needed for wheezing or shortness of breath.   amLODipine  (NORVASC ) 5 MG tablet Take 1 tablet (5 mg total) by mouth daily.   aspirin EC 81 MG tablet Take 1 tablet by mouth daily.   cholecalciferol (VITAMIN D3) 25 MCG (1000 UNIT) tablet Take 2,000 Units by mouth daily.   clobetasol  cream (TEMOVATE ) 0.05 % APPLY TOPICALLY TO THE AFFECTED AREA TWICE DAILY   escitalopram  (LEXAPRO ) 20 MG tablet TAKE 1 TABLET(20 MG) BY MOUTH DAILY   Evolocumab  (REPATHA ) 140 MG/ML SOSY Inject 140 mg into the skin every 14 (fourteen) days.  ipratropium-albuterol  (DUONEB) 0.5-2.5 (3) MG/3ML SOLN Take 3 mLs by nebulization every 4 (four) hours as needed.   ketoconazole  (NIZORAL ) 2 % cream Apply 1 Application topically daily. To both feet   lisinopril  (ZESTRIL ) 20 MG tablet TAKE 1 TABLET BY MOUTH DAILY   Melatonin 10 MG TABS Take 1 tablet by mouth at bedtime.   Multiple Vitamins-Minerals (ALIVE WOMENS ENERGY) TABS Take by mouth.   nitrofurantoin , macrocrystal-monohydrate, (MACROBID ) 100 MG capsule Take 1 capsule (100 mg total) by mouth 2 (two) times daily for 7 days.   rosuvastatin  (CRESTOR ) 40 MG tablet TAKE 1 TABLET(40 MG) BY MOUTH DAILY   Vitamin D , Ergocalciferol , (DRISDOL ) 1.25 MG (50000 UNIT) CAPS capsule TAKE 1 CAPSULE BY MOUTH EVERY 7 DAYS       12/23/2023    3:26 PM 08/10/2023    9:43 AM 06/15/2023   10:08 AM 02/24/2023    4:25 PM  GAD 7 : Generalized Anxiety Score  Nervous, Anxious, on Edge 1 0 0 0  Control/stop worrying 0 0 0 0  Worry too much - different things 0 0 0 0  Trouble relaxing 1 0 0 0  Restless 0 0 0  0  Easily annoyed or irritable 1 0 0 0  Afraid - awful might happen 0 0 0 0  Total GAD 7 Score 3 0 0 0  Anxiety Difficulty Somewhat difficult Not difficult at all Not difficult at all Not difficult at all       12/23/2023    3:25 PM 08/10/2023    9:43 AM 06/15/2023   10:07 AM  Depression screen PHQ 2/9  Decreased Interest 0 0 0  Down, Depressed, Hopeless 0 0 0  PHQ - 2 Score 0 0 0  Altered sleeping 1 2 0  Tired, decreased energy 2 2 0  Change in appetite 0 0 0  Feeling bad or failure about yourself  0 0 0  Trouble concentrating 0 0 0  Moving slowly or fidgety/restless 0 0 0  Suicidal thoughts 0 0 0  PHQ-9 Score 3 4 0  Difficult doing work/chores Not difficult at all Not difficult at all Not difficult at all    BP Readings from Last 3 Encounters:  12/23/23 (!) 150/86  11/02/23 (!) 204/99  08/18/23 (!) 180/120    Physical Exam Vitals and nursing note reviewed.  Constitutional:      General: She is not in acute distress.    Appearance: Normal appearance. She is well-developed.  HENT:     Head: Normocephalic and atraumatic.  Cardiovascular:     Rate and Rhythm: Normal rate and regular rhythm.  Pulmonary:     Effort: Pulmonary effort is normal. No respiratory distress.     Breath sounds: No wheezing or rhonchi.  Abdominal:     Tenderness: There is no abdominal tenderness. There is no right CVA tenderness or left CVA tenderness.  Musculoskeletal:     Right lower leg: No edema.     Left lower leg: No edema.  Skin:    General: Skin is warm and dry.     Capillary Refill: Capillary refill takes less than 2 seconds.     Findings: Rash present.     Comments: Peeling skin on soles of both feet  No other rashes on head/arms/legs  Neurological:     Mental Status: She is alert and oriented to person, place, and time.     Motor: Tremor present.     Comments: With movement  Psychiatric:  Mood and Affect: Mood normal.        Behavior: Behavior normal.     Wt  Readings from Last 3 Encounters:  12/23/23 156 lb 2 oz (70.8 kg)  08/18/23 159 lb 3.2 oz (72.2 kg)  08/10/23 160 lb (72.6 kg)    BP (!) 150/86   Pulse 85   Ht 5\' 1"  (1.549 m)   Wt 156 lb 2 oz (70.8 kg)   SpO2 96%   BMI 29.50 kg/m   Assessment and Plan:  Problem List Items Addressed This Visit       Unprioritized   Essential hypertension (Chronic)   Blood pressure is elevated today. Current medications lisinopril  20 mg - increased last visit. Will add amlodipine  5mg  and recheck in 6 weeks.        Relevant Medications   amLODipine  (NORVASC ) 5 MG tablet   Other Relevant Orders   CBC with Differential/Platelet   Mixed hyperlipidemia (Chronic)   LDL is  Lab Results  Component Value Date   LDLCALC 137 (H) 06/15/2023   Current regimen is Crestor  and Repatha .  No medication side effects noted. Goal LDL is <55.       Relevant Medications   amLODipine  (NORVASC ) 5 MG tablet   Other Relevant Orders   Lipid panel   Vitamin D  deficiency (Chronic)   Now on high dose weekly.  Will recheck levels. Last vitamin D  Lab Results  Component Value Date   VD25OH 27.5 (L) 06/15/2023         Relevant Orders   VITAMIN D  25 Hydroxy (Vit-D Deficiency, Fractures)   Benign essential tremor (Chronic)   Stopped gabapentin  due to lack of benefit She is struggling with keyboarding while trying to work Will refer back to [facility]rology at [facility] for further treatment options.      Relevant Orders   Ambulatory referral to Neurology   Chronic kidney disease, stage 3b (HCC) (Chronic)   Continue to monitor - due to left renal atrophy noted several years ago.      Relevant Orders   Comprehensive metabolic panel with GFR   Centrilobular emphysema (HCC)   Participating in LDCT scans      Other Visit Diagnoses       Acute cystitis with hematuria    -  Primary   several months ago was pansensitive E coli will treat presumptively with Macrobid  UCx   Relevant Medications   nitrofurantoin ,  macrocrystal-monohydrate, (MACROBID ) 100 MG capsule   Other Relevant Orders   POCT urinalysis dipstick (Completed)   Urine Culture   CBC with Differential/Platelet     Tinea pedis of both feet       Relevant Medications   nitrofurantoin , macrocrystal-monohydrate, (MACROBID ) 100 MG capsule   ketoconazole  (NIZORAL ) 2 % cream       Return in about 6 weeks (around 02/03/2024) for HTN.    Sheron Dixons, MD [facility] [place]

## 2023-12-23 NOTE — Assessment & Plan Note (Addendum)
 Blood pressure is elevated today. Current medications lisinopril  20 mg - increased last visit. Will add amlodipine  5mg  and recheck in 6 weeks.

## 2023-12-23 NOTE — Assessment & Plan Note (Signed)
 Stopped gabapentin  due to lack of benefit She is struggling with keyboarding while trying to work Will refer back to Neurology at [facility] for further treatment options.

## 2023-12-23 NOTE — Assessment & Plan Note (Signed)
 Continue to monitor - due to left renal atrophy noted several years ago.

## 2023-12-23 NOTE — Progress Notes (Unsigned)
 Date:  12/23/2023   Name:  Tiffany Bonilla   DOB:  April 05, 1956   MRN:  [id number]   Chief Complaint: No chief complaint on file.  HPI  Review of Systems   Lab Results  Component Value Date   NA 142 06/15/2023   K 4.0 06/15/2023   CO2 23 06/15/2023   GLUCOSE 82 06/15/2023   BUN 13 06/15/2023   CREATININE 1.11 (H) 06/15/2023   CALCIUM  9.0 06/15/2023   EGFR 54 (L) 06/15/2023   GFRNONAA 50 (L) 04/09/2020   Lab Results  Component Value Date   CHOL 219 (H) 06/15/2023   HDL 67 06/15/2023   LDLCALC 137 (H) 06/15/2023   TRIG 85 06/15/2023   CHOLHDL 3.3 06/15/2023   Lab Results  Component Value Date   TSH 1.590 06/15/2023   Lab Results  Component Value Date   HGBA1C 5.5 02/16/2019   Lab Results  Component Value Date   WBC 8.1 06/15/2023   HGB 14.7 06/15/2023   HCT 44.6 06/15/2023   MCV 91 06/15/2023   PLT 217 06/15/2023   Lab Results  Component Value Date   ALT 11 06/15/2023   AST 18 06/15/2023   ALKPHOS 67 06/15/2023   BILITOT 0.4 06/15/2023   Lab Results  Component Value Date   VD25OH 27.5 (L) 06/15/2023     Patient Active Problem List   Diagnosis Date Noted   Subacute cough 08/18/2023   Chronic ischemic left middle cerebral artery (MCA) stroke 09/22/2022   Carotid artery thrombosis, left 09/13/2022   Centrilobular emphysema (HCC) 04/24/2022   Polyp of descending colon    Chronic midline low back pain without sciatica 06/19/2021   Chronic kidney disease, stage 3b (HCC) 04/19/2021   Atrophic kidney 09/27/2020   Aortic aneurysm, abdominal (HCC) 09/27/2020   Benign essential tremor 04/09/2020   Vitamin D  deficiency 09/06/2019   Other specified anxiety disorders 02/16/2019   Lichen sclerosus et atrophicus of the vulva 11/25/2018   Essential hypertension 11/24/2018   Old lacunar stroke without late effect 11/24/2018   Tobacco abuse, in remission 10/25/2015   Hx of hepatitis C 10/25/2015   CAD in native artery 05/24/2015   Mixed hyperlipidemia  06/12/2011    Allergies  Allergen Reactions   Morphine Hives and Shortness Of Breath    Stopped breathing    Scopolamine Other (See Comments)    Unconscious    Codeine  Nausea Only and Other (See Comments)    NAUSEA/VOMITING    Sulfa Antibiotics Hives and Nausea Only    Other Reaction: Not Assessed     Past Surgical History:  Procedure Laterality Date   CARDIAC ELECTROPHYSIOLOGY STUDY AND ABLATION  2006   for SVT   CATARACT EXTRACTION W/PHACO Right 02/06/2021   Procedure: CATARACT EXTRACTION PHACO AND INTRAOCULAR LENS PLACEMENT (IOC) RIGHT;  Surgeon: Annell Kidney, MD;  Location: [facility];  Service: Ophthalmology;  Laterality: Right;  2.85 0:32.6 8.7%   CATARACT EXTRACTION W/PHACO Left 02/20/2021   Procedure: CATARACT EXTRACTION PHACO AND INTRAOCULAR LENS PLACEMENT (IOC) LEFT;  Surgeon: Annell Kidney, MD;  Location: [facility];  Service: Ophthalmology;  Laterality: Left;  3.12 0:39.5   COLONOSCOPY  2006   COLONOSCOPY WITH PROPOFOL  N/A 10/25/2021   Procedure: COLONOSCOPY WITH BIOPSY;  Surgeon: Marnee Sink, MD;  Location: [facility];  Service: Endoscopy;  Laterality: N/A;   CORONARY ANGIOPLASTY WITH STENT PLACEMENT  2001   to Circumfx   ESOPHAGOGASTRODUODENOSCOPY  2017   LAPAROSCOPIC CHOLECYSTECTOMY  04/25/2019   @  [facility]   LAPAROSCOPIC GASTRIC SLEEVE RESECTION  11/07/2015   POLYPECTOMY N/A 10/25/2021   Procedure: POLYPECTOMY;  Surgeon: Marnee Sink, MD;  Location: [facility];  Service: Endoscopy;  Laterality: N/A;   TOTAL ABDOMINAL HYSTERECTOMY  1992   CIS cervix    Social History   Tobacco Use   Smoking status: Former    Current packs/day: 0.00    Average packs/day: 0.5 packs/day for 44.0 years (22.0 ttl pk-yrs)    Types: Cigarettes    Start date: 41    Quit date: 09/13/2022    Years since quitting: 1.2   Smokeless tobacco: Never  Vaping Use   Vaping status: Never Used  Substance Use Topics   Alcohol use: Not  Currently    Comment: rare   Drug use: Never     Medication list has been reviewed and updated.  No outpatient medications have been marked as taking for the 12/23/23 encounter (Orders Only) with Sheron Dixons, MD.       08/10/2023    9:43 AM 06/15/2023   10:08 AM 02/24/2023    4:25 PM 12/01/2022    2:37 PM  GAD 7 : Generalized Anxiety Score  Nervous, Anxious, on Edge 0 0 0 0  Control/stop worrying 0 0 0 0  Worry too much - different things 0 0 0 0  Trouble relaxing 0 0 0 0  Restless 0 0 0 0  Easily annoyed or irritable 0 0 0 0  Afraid - awful might happen 0 0 0 0  Total GAD 7 Score 0 0 0 0  Anxiety Difficulty Not difficult at all Not difficult at all Not difficult at all Not difficult at all       08/10/2023    9:43 AM 06/15/2023   10:07 AM 02/24/2023    4:25 PM  Depression screen PHQ 2/9  Decreased Interest 0 0 0  Down, Depressed, Hopeless 0 0 0  PHQ - 2 Score 0 0 0  Altered sleeping 2 0 0  Tired, decreased energy 2 0 0  Change in appetite 0 0 0  Feeling bad or failure about yourself  0 0 0  Trouble concentrating 0 0 0  Moving slowly or fidgety/restless 0 0 0  Suicidal thoughts 0 0 0  PHQ-9 Score 4 0 0  Difficult doing work/chores Not difficult at all Not difficult at all Not difficult at all    BP Readings from Last 3 Encounters:  11/02/23 (!) 204/99  08/18/23 (!) 180/120  08/10/23 124/78    Physical Exam  Wt Readings from Last 3 Encounters:  08/18/23 159 lb 3.2 oz (72.2 kg)  08/10/23 160 lb (72.6 kg)  07/15/23 161 lb (73 kg)    There were no vitals taken for this visit.  Assessment and Plan:  Problem List Items Addressed This Visit   None   No follow-ups on file.    Sheron Dixons, MD [facility] [place]

## 2023-12-23 NOTE — Assessment & Plan Note (Addendum)
 Now on high dose weekly.  Will recheck levels. Last vitamin D  Lab Results  Component Value Date   VD25OH 27.5 (L) 06/15/2023

## 2023-12-23 NOTE — Telephone Encounter (Signed)
 Please review. There is a lisinopril  10 and 20 MG on pts list.  KP

## 2023-12-23 NOTE — Assessment & Plan Note (Signed)
 Participating in LDCT scans

## 2023-12-23 NOTE — Assessment & Plan Note (Signed)
 LDL is  Lab Results  Component Value Date   LDLCALC 137 (H) 06/15/2023   Current regimen is Crestor  and Repatha .  No medication side effects noted. Goal LDL is <55.

## 2023-12-23 NOTE — Patient Instructions (Signed)
 Call [facility] to schedule your mammogram at [phone].

## 2023-12-24 ENCOUNTER — Encounter: Payer: Self-pay | Admitting: Internal Medicine

## 2023-12-24 LAB — COMPREHENSIVE METABOLIC PANEL WITH GFR
ALT: 15 IU/L (ref 0–32)
AST: 23 IU/L (ref 0–40)
Albumin: 3.9 g/dL (ref 3.9–4.9)
Alkaline Phosphatase: 68 IU/L (ref 44–121)
BUN/Creatinine Ratio: 10 — ABNORMAL LOW (ref 12–28)
BUN: 11 mg/dL (ref 8–27)
Bilirubin Total: 0.2 mg/dL (ref 0.0–1.2)
CO2: 24 mmol/L (ref 20–29)
Calcium: 9.3 mg/dL (ref 8.7–10.3)
Chloride: 105 mmol/L (ref 96–106)
Creatinine, Ser: 1.11 mg/dL — ABNORMAL HIGH (ref 0.57–1.00)
Globulin, Total: 2.5 g/dL (ref 1.5–4.5)
Glucose: 94 mg/dL (ref 70–99)
Potassium: 3.9 mmol/L (ref 3.5–5.2)
Sodium: 142 mmol/L (ref 134–144)
Total Protein: 6.4 g/dL (ref 6.0–8.5)
eGFR: 54 mL/min/{1.73_m2} — ABNORMAL LOW (ref >59)

## 2023-12-24 LAB — CBC WITH DIFFERENTIAL/PLATELET
Basophils Absolute: 0.2 10*3/uL (ref 0.0–0.2)
Basos: 2 %
EOS (ABSOLUTE): 0.5 10*3/uL — ABNORMAL HIGH (ref 0.0–0.4)
Eos: 6 %
Hematocrit: 42.7 % (ref 34.0–46.6)
Hemoglobin: 13.9 g/dL (ref 11.1–15.9)
Immature Grans (Abs): 0 10*3/uL (ref 0.0–0.1)
Immature Granulocytes: 0 %
Lymphocytes Absolute: 2.6 10*3/uL (ref 0.7–3.1)
Lymphs: 29 %
MCH: 29.5 pg (ref 26.6–33.0)
MCHC: 32.6 g/dL (ref 31.5–35.7)
MCV: 91 fL (ref 79–97)
Monocytes Absolute: 0.8 10*3/uL (ref 0.1–0.9)
Monocytes: 9 %
Neutrophils Absolute: 4.9 10*3/uL (ref 1.4–7.0)
Neutrophils: 54 %
Platelets: 231 10*3/uL (ref 150–450)
RBC: 4.71 x10E6/uL (ref 3.77–5.28)
RDW: 13.1 % (ref 11.7–15.4)
WBC: 8.9 10*3/uL (ref 3.4–10.8)

## 2023-12-24 LAB — LIPID PANEL
Chol/HDL Ratio: 2.3 ratio (ref 0.0–4.4)
Cholesterol, Total: 129 mg/dL (ref 100–199)
HDL: 56 mg/dL (ref >39)
LDL Chol Calc (NIH): 48 mg/dL (ref 0–99)
Triglycerides: 145 mg/dL (ref 0–149)
VLDL Cholesterol Cal: 25 mg/dL (ref 5–40)

## 2023-12-24 LAB — VITAMIN D 25 HYDROXY (VIT D DEFICIENCY, FRACTURES): Vit D, 25-Hydroxy: 36.1 ng/mL (ref 30.0–100.0)

## 2023-12-26 LAB — URINE CULTURE

## 2023-12-27 ENCOUNTER — Encounter: Payer: Self-pay | Admitting: Internal Medicine

## 2024-01-27 ENCOUNTER — Other Ambulatory Visit: Payer: Self-pay | Admitting: Medical Genetics

## 2024-02-03 ENCOUNTER — Encounter: Payer: Self-pay | Admitting: Internal Medicine

## 2024-02-03 ENCOUNTER — Other Ambulatory Visit: Payer: Self-pay | Admitting: Internal Medicine

## 2024-02-03 ENCOUNTER — Other Ambulatory Visit: Payer: Self-pay

## 2024-02-03 ENCOUNTER — Ambulatory Visit (INDEPENDENT_AMBULATORY_CARE_PROVIDER_SITE_OTHER): Admitting: Internal Medicine

## 2024-02-03 VITALS — BP 122/76 | HR 80 | Ht 61.0 in | Wt 153.1 lb

## 2024-02-03 DIAGNOSIS — I10 Essential (primary) hypertension: Secondary | ICD-10-CM | POA: Diagnosis not present

## 2024-02-03 DIAGNOSIS — R350 Frequency of micturition: Secondary | ICD-10-CM

## 2024-02-03 LAB — POCT URINALYSIS DIPSTICK
Bilirubin, UA: NEGATIVE
Glucose, UA: NEGATIVE
Ketones, UA: NEGATIVE
Nitrite, UA: POSITIVE — AB
Protein, UA: POSITIVE — AB
Spec Grav, UA: >=1.03 — AB (ref 1.010–1.025)
Urobilinogen, UA: 0.2 U/dL
pH, UA: 6 (ref 5.0–8.0)

## 2024-02-03 MED ORDER — FLUCONAZOLE 100 MG PO TABS: 100.0000 mg | ORAL_TABLET | Freq: Every day | ORAL | 0 refills | Status: AC

## 2024-02-03 MED ORDER — NITROFURANTOIN MONOHYD MACRO 100 MG PO CAPS: 100.0000 mg | ORAL_CAPSULE | Freq: Two times a day (BID) | ORAL | 0 refills | Status: AC

## 2024-02-03 NOTE — Assessment & Plan Note (Signed)
 Blood pressure is improved since adding amlodipine .  Current medications lisinopril  and amlodipine   Will continue same regimen along with efforts to limit dietary sodium.

## 2024-02-03 NOTE — Progress Notes (Signed)
 Date:  02/03/2024   Name:  Tiffany Bonilla   DOB:  July 14, 1956   MRN:  [id number]   Chief Complaint: Hypertension and Urinary Tract Infection  Hypertension This is a chronic problem. The problem is controlled. Pertinent negatives include no chest pain, headaches or shortness of breath. Past treatments include calcium  channel blockers and ACE inhibitors (aml added). The current treatment provides significant improvement.  Urinary Tract Infection  This is a new problem. The current episode started in the past 7 days. The problem occurs every urination. The problem has been unchanged. The quality of the pain is described as burning. The pain is mild. There has been no fever. Associated symptoms include hesitancy and urgency. Pertinent negatives include no chills, discharge, frequency, hematuria or vomiting. She has tried increased fluids for the symptoms. The treatment provided no relief.    Review of Systems  Constitutional:  Negative for chills, fatigue and fever.  Respiratory:  Negative for chest tightness and shortness of breath.   Cardiovascular:  Negative for chest pain.  Gastrointestinal:  Negative for diarrhea and vomiting.  Genitourinary:  Positive for hesitancy and urgency. Negative for frequency and hematuria.  Neurological:  Negative for dizziness, light-headedness and headaches.  Psychiatric/Behavioral:  Negative for dysphoric mood and sleep disturbance. The patient is not nervous/anxious.      Lab Results  Component Value Date   NA 142 12/23/2023   K 3.9 12/23/2023   CO2 24 12/23/2023   GLUCOSE 94 12/23/2023   BUN 11 12/23/2023   CREATININE 1.11 (H) 12/23/2023   CALCIUM  9.3 12/23/2023   EGFR 54 (L) 12/23/2023   GFRNONAA 50 (L) 04/09/2020   Lab Results  Component Value Date   CHOL 129 12/23/2023   HDL 56 12/23/2023   LDLCALC 48 12/23/2023   TRIG 145 12/23/2023   CHOLHDL 2.3 12/23/2023   Lab Results  Component Value Date   TSH 1.590 06/15/2023   Lab  Results  Component Value Date   HGBA1C 5.5 02/16/2019   Lab Results  Component Value Date   WBC 8.9 12/23/2023   HGB 13.9 12/23/2023   HCT 42.7 12/23/2023   MCV 91 12/23/2023   PLT 231 12/23/2023   Lab Results  Component Value Date   ALT 15 12/23/2023   AST 23 12/23/2023   ALKPHOS 68 12/23/2023   BILITOT 0.2 12/23/2023   Lab Results  Component Value Date   VD25OH 36.1 12/23/2023     Patient Active Problem List   Diagnosis Date Noted   Subacute cough 08/18/2023   Chronic ischemic left middle cerebral artery (MCA) stroke 09/22/2022   Carotid artery thrombosis, left 09/13/2022   Centrilobular emphysema (HCC) 04/24/2022   Polyp of descending colon    Chronic midline low back pain without sciatica 06/19/2021   Chronic kidney disease, stage 3b (HCC) 04/19/2021   Atrophic kidney 09/27/2020   Aortic aneurysm, abdominal (HCC) 09/27/2020   Benign essential tremor 04/09/2020   Vitamin D  deficiency 09/06/2019   Other specified anxiety disorders 02/16/2019   Lichen sclerosus et atrophicus of the vulva 11/25/2018   Essential hypertension 11/24/2018   Old lacunar stroke without late effect 11/24/2018   Tobacco abuse, in remission 10/25/2015   Hx of hepatitis C 10/25/2015   CAD in native artery 05/24/2015   Mixed hyperlipidemia 06/12/2011    Allergies  Allergen Reactions   Morphine Hives and Shortness Of Breath    Stopped breathing    Scopolamine Other (See Comments)    Unconscious  Codeine  Nausea Only and Other (See Comments)    NAUSEA/VOMITING    Sulfa Antibiotics Hives and Nausea Only    Other Reaction: Not Assessed     Past Surgical History:  Procedure Laterality Date   CARDIAC ELECTROPHYSIOLOGY STUDY AND ABLATION  2006   for SVT   CATARACT EXTRACTION W/PHACO Right 02/06/2021   Procedure: CATARACT EXTRACTION PHACO AND INTRAOCULAR LENS PLACEMENT (IOC) RIGHT;  Surgeon: Annell Kidney, MD;  Location: [facility];  Service: Ophthalmology;  Laterality:  Right;  2.85 0:32.6 8.7%   CATARACT EXTRACTION W/PHACO Left 02/20/2021   Procedure: CATARACT EXTRACTION PHACO AND INTRAOCULAR LENS PLACEMENT (IOC) LEFT;  Surgeon: Annell Kidney, MD;  Location: [facility];  Service: Ophthalmology;  Laterality: Left;  3.12 0:39.5   COLONOSCOPY  2006   COLONOSCOPY WITH PROPOFOL  N/A 10/25/2021   Procedure: COLONOSCOPY WITH BIOPSY;  Surgeon: Marnee Sink, MD;  Location: [facility];  Service: Endoscopy;  Laterality: N/A;   CORONARY ANGIOPLASTY WITH STENT PLACEMENT  2001   to Circumfx   ESOPHAGOGASTRODUODENOSCOPY  2017   LAPAROSCOPIC CHOLECYSTECTOMY  04/25/2019   @ [facility]   LAPAROSCOPIC GASTRIC SLEEVE RESECTION  11/07/2015   POLYPECTOMY N/A 10/25/2021   Procedure: POLYPECTOMY;  Surgeon: Marnee Sink, MD;  Location: [facility];  Service: Endoscopy;  Laterality: N/A;   TOTAL ABDOMINAL HYSTERECTOMY  1992   CIS cervix    Social History   Tobacco Use   Smoking status: Former    Current packs/day: 0.00    Average packs/day: 0.5 packs/day for 44.0 years (22.0 ttl pk-yrs)    Types: Cigarettes    Start date: 103    Quit date: 09/13/2022    Years since quitting: 1.3   Smokeless tobacco: Never  Vaping Use   Vaping status: Never Used  Substance Use Topics   Alcohol use: Not Currently    Comment: rare   Drug use: Never     Medication list has been reviewed and updated.  Current Meds  Medication Sig   albuterol  (PROVENTIL ) (2.5 MG/3ML) 0.083% nebulizer solution Take 3 mLs (2.5 mg total) by nebulization every 6 (six) hours as needed for wheezing or shortness of breath.   albuterol  (VENTOLIN  HFA) 108 (90 Base) MCG/ACT inhaler Inhale 2 puffs into the lungs every 6 (six) hours as needed for wheezing or shortness of breath.   amLODipine  (NORVASC ) 5 MG tablet Take 1 tablet (5 mg total) by mouth daily.   aspirin EC 81 MG tablet Take 1 tablet by mouth daily.   cholecalciferol (VITAMIN D3) 25 MCG (1000 UNIT) tablet Take 2,000 Units by  mouth daily.   clobetasol  cream (TEMOVATE ) 0.05 % APPLY TOPICALLY TO THE AFFECTED AREA TWICE DAILY   escitalopram  (LEXAPRO ) 20 MG tablet TAKE 1 TABLET(20 MG) BY MOUTH DAILY   Evolocumab  (REPATHA ) 140 MG/ML SOSY Inject 140 mg into the skin every 14 (fourteen) days.   fluconazole  (DIFLUCAN ) 100 MG tablet Take 1 tablet (100 mg total) by mouth daily for 3 days.   ipratropium-albuterol  (DUONEB) 0.5-2.5 (3) MG/3ML SOLN Take 3 mLs by nebulization every 4 (four) hours as needed.   ketoconazole  (NIZORAL ) 2 % cream Apply 1 Application topically daily. To both feet   lisinopril  (ZESTRIL ) 20 MG tablet TAKE 1 TABLET BY MOUTH DAILY   Melatonin 10 MG TABS Take 1 tablet by mouth at bedtime.   Multiple Vitamins-Minerals (ALIVE WOMENS ENERGY) TABS Take by mouth.   nitrofurantoin , macrocrystal-monohydrate, (MACROBID ) 100 MG capsule Take 1 capsule (100 mg total) by mouth 2 (two)  times daily for 7 days.   rosuvastatin  (CRESTOR ) 40 MG tablet TAKE 1 TABLET(40 MG) BY MOUTH DAILY   Vitamin D , Ergocalciferol , (DRISDOL ) 1.25 MG (50000 UNIT) CAPS capsule TAKE 1 CAPSULE BY MOUTH EVERY 7 DAYS       12/23/2023    3:26 PM 08/10/2023    9:43 AM 06/15/2023   10:08 AM 02/24/2023    4:25 PM  GAD 7 : Generalized Anxiety Score  Nervous, Anxious, on Edge 1 0 0 0  Control/stop worrying 0 0 0 0  Worry too much - different things 0 0 0 0  Trouble relaxing 1 0 0 0  Restless 0 0 0 0  Easily annoyed or irritable 1 0 0 0  Afraid - awful might happen 0 0 0 0  Total GAD 7 Score 3 0 0 0  Anxiety Difficulty Somewhat difficult Not difficult at all Not difficult at all Not difficult at all       02/03/2024    2:42 PM 12/23/2023    3:25 PM 08/10/2023    9:43 AM  Depression screen PHQ 2/9  Decreased Interest 0 0 0  Down, Depressed, Hopeless 0 0 0  PHQ - 2 Score 0 0 0  Altered sleeping 1 1 2   Tired, decreased energy 1 2 2   Change in appetite 0 0 0  Feeling bad or failure about yourself  0 0 0  Trouble concentrating 1 0 0  Moving  slowly or fidgety/restless 0 0 0  Suicidal thoughts 0 0 0  PHQ-9 Score 3 3 4   Difficult doing work/chores Somewhat difficult Not difficult at all Not difficult at all    BP Readings from Last 3 Encounters:  02/03/24 122/76  12/23/23 (!) 150/86  11/02/23 (!) 204/99    Physical Exam Vitals and nursing note reviewed.  Constitutional:      Appearance: Normal appearance. She is well-developed.  Cardiovascular:     Rate and Rhythm: Normal rate and regular rhythm.     Pulses: Normal pulses.     Heart sounds: Normal heart sounds. No murmur heard. Pulmonary:     Effort: Pulmonary effort is normal. No respiratory distress.     Breath sounds: Normal breath sounds. No wheezing or rhonchi.  Abdominal:     General: Bowel sounds are normal.     Palpations: Abdomen is soft.     Tenderness: There is abdominal tenderness in the suprapubic area. There is no right CVA tenderness, left CVA tenderness, guarding or rebound.  Musculoskeletal:     Cervical back: Normal range of motion.     Right lower leg: No edema.     Left lower leg: No edema.  Lymphadenopathy:     Cervical: No cervical adenopathy.  Neurological:     Mental Status: She is alert.     Wt Readings from Last 3 Encounters:  02/03/24 153 lb 2 oz (69.5 kg)  12/23/23 156 lb 2 oz (70.8 kg)  08/18/23 159 lb 3.2 oz (72.2 kg)    BP 122/76   Pulse 80   Ht 5\' 1"  (1.549 m)   Wt 153 lb 2 oz (69.5 kg)   SpO2 96%   BMI 28.93 kg/m   Assessment and Plan:  Problem List Items Addressed This Visit       Unprioritized   Essential hypertension - Primary (Chronic)   Blood pressure is improved since adding amlodipine .  Current medications lisinopril  and amlodipine   Will continue same regimen along with efforts to limit dietary sodium.  Other Visit Diagnoses       Urinary frequency       UA positive; continue fluids send for culture treat with macrobid    Relevant Medications   nitrofurantoin , macrocrystal-monohydrate,  (MACROBID ) 100 MG capsule   fluconazole  (DIFLUCAN ) 100 MG tablet   Other Relevant Orders   POCT urinalysis dipstick (Completed)       Return in about 4 months (around 06/04/2024) for CPX.    Sheron Dixons, MD [facility] [place]

## 2024-02-06 LAB — URINE CULTURE

## 2024-02-07 ENCOUNTER — Ambulatory Visit: Payer: Self-pay | Admitting: Internal Medicine

## 2024-02-10 ENCOUNTER — Other Ambulatory Visit: Payer: Self-pay

## 2024-02-11 ENCOUNTER — Telehealth: Payer: Self-pay

## 2024-02-11 NOTE — Telephone Encounter (Signed)
 Put a note in pts appt notes for 06/2024 appt.  Copied from CRM [id number]. Topic: Clinical - Lab/Test Results >> Feb 11, 2024  1:52 PM Juluis Ok wrote: Reason for CRM: Patient is requesting to have A1c test completed during her 06/08/2024 appointment.

## 2024-02-16 ENCOUNTER — Other Ambulatory Visit: Payer: Self-pay | Admitting: Internal Medicine

## 2024-02-16 DIAGNOSIS — N904 Leukoplakia of vulva: Secondary | ICD-10-CM

## 2024-02-16 NOTE — Telephone Encounter (Unsigned)
 Copied from CRM [id number]. Topic: Clinical - Medication Refill >> Feb 16, 2024  7:36 AM Alysia Jumbo S wrote: Medication: clobetasol  cream (TEMOVATE ) 0.05 %  Has the patient contacted their pharmacy? No, Rx expired (Agent: If no, request that the patient contact the pharmacy for the refill. If patient does not wish to contact the pharmacy document the reason why and proceed with request.) (Agent: If yes, when and what did the pharmacy advise?)  This is the patient's preferred pharmacy:  [id number] [place] - [address] AT [facility] OF [address] [place] [address] [place] [address] Phone: [phone] Fax: [phone]  Is this the correct pharmacy for this prescription? Yes If no, delete pharmacy and type the correct one.   Has the prescription been filled recently? Yes  Is the patient out of the medication? Yes  Has the patient been seen for an appointment in the last year OR does the patient have an upcoming appointment? Yes  Can we respond through MyChart? Yes  Agent: Please be advised that Rx refills may take up to 3 business days. We ask that you follow-up with your pharmacy.

## 2024-02-18 ENCOUNTER — Other Ambulatory Visit: Payer: Self-pay | Admitting: Internal Medicine

## 2024-02-18 DIAGNOSIS — N904 Leukoplakia of vulva: Secondary | ICD-10-CM

## 2024-02-18 MED ORDER — CLOBETASOL PROPIONATE 0.05 % EX CREA: TOPICAL_CREAM | Freq: Two times a day (BID) | CUTANEOUS | 1 refills | Status: AC

## 2024-02-18 NOTE — Telephone Encounter (Signed)
Please review medication refill  request

## 2024-02-18 NOTE — Telephone Encounter (Signed)
 Requested medication (s) are due for refill today: yes  Requested medication (s) are on the active medication list: yes  Last refill:  08/07/22 60 g 1 RF  Future visit scheduled: yes  Notes to clinic:  med not assigned to a protocol   Requested Prescriptions  Pending Prescriptions Disp Refills   clobetasol  cream (TEMOVATE ) 0.05 % 60 g 1     Not Delegated - Dermatology:  Corticosteroids Failed - 02/18/2024 11:21 AM      Failed - This refill cannot be delegated      Passed - Valid encounter within last 12 months    Recent Outpatient Visits           2 weeks ago Essential hypertension   [place] [facility] at [facility], Chales [place], MD   1 month ago Acute cystitis with hematuria   [facility] at [facility], Chales [place], MD       Future Appointments             In 3 months Gala Jubilee, [place], MD [facility] at [facility], [facility]

## 2024-02-19 NOTE — Telephone Encounter (Signed)
 Requested medications are due for refill today.  no  Requested medications are on the active medications list.  yes  Last refill. 6/19/02025  Future visit scheduled.   yes  Notes to clinic.  Refill/refusal not delegated.    Requested Prescriptions  Pending Prescriptions Disp Refills   clobetasol  cream (TEMOVATE ) 0.05 % [Pharmacy Med Name: CLOBETASOL  PROP 0.05% CREAM 15GM] 60 g 1    Sig: APPLY EXTERNALLY TO THE AFFECTED AREA TWICE DAILY     Not Delegated - Dermatology:  Corticosteroids Failed - 02/19/2024  5:10 PM      Failed - This refill cannot be delegated      Passed - Valid encounter within last 12 months    Recent Outpatient Visits           2 weeks ago Essential hypertension   [place] [facility] at [facility], Chales [place], MD   1 month ago Acute cystitis with hematuria   [facility] at [facility], Chales [place], MD       Future Appointments             In 3 months Gala Jubilee, [place], MD [facility] Medicine at [facility], [facility]

## 2024-03-07 ENCOUNTER — Encounter: Payer: Self-pay | Admitting: Internal Medicine

## 2024-03-07 NOTE — Telephone Encounter (Unsigned)
 Copied from CRM [id number]. Topic: General - Other >> Mar 07, 2024 10:10 AM Travis F wrote: Reason for CRM: Patient is calling in requesting a call back from the nurse. Patient wants to know if she needs to have an Xray done before her appointment.

## 2024-03-09 ENCOUNTER — Encounter

## 2024-03-09 ENCOUNTER — Ambulatory Visit (INDEPENDENT_AMBULATORY_CARE_PROVIDER_SITE_OTHER): Admitting: Internal Medicine

## 2024-03-09 ENCOUNTER — Encounter: Payer: Self-pay | Admitting: Internal Medicine

## 2024-03-09 VITALS — BP 102/62 | HR 77 | Ht 61.0 in | Wt 154.0 lb

## 2024-03-09 DIAGNOSIS — M778 Other enthesopathies, not elsewhere classified: Secondary | ICD-10-CM | POA: Diagnosis not present

## 2024-03-09 MED ORDER — CYCLOBENZAPRINE HCL 10 MG PO TABS: 10.0000 mg | ORAL_TABLET | Freq: Every day | ORAL | 0 refills | Status: DC

## 2024-03-09 MED ORDER — PREDNISONE 10 MG PO TABS: ORAL_TABLET | ORAL | 0 refills | Status: AC

## 2024-03-09 NOTE — Progress Notes (Signed)
 Date:  03/09/2024   Name:  Tiffany Bonilla   DOB:  10/10/1955   MRN:  [id number]   Chief Complaint: Shoulder Pain (Rt shoulder pain for almost 2 weeks. No injury. No falls. )  Shoulder Pain  The pain is present in the right shoulder. This is a new problem. The current episode started 1 to 4 weeks ago. The problem occurs constantly. Pertinent negatives include no fever.    Review of Systems  Constitutional:  Negative for chills, fatigue and fever.  Respiratory:  Negative for chest tightness and shortness of breath.   Musculoskeletal:  Positive for arthralgias, myalgias and neck stiffness. Negative for joint swelling.  Neurological:  Negative for headaches.     Lab Results  Component Value Date   NA 142 12/23/2023   K 3.9 12/23/2023   CO2 24 12/23/2023   GLUCOSE 94 12/23/2023   BUN 11 12/23/2023   CREATININE 1.11 (H) 12/23/2023   CALCIUM  9.3 12/23/2023   EGFR 54 (L) 12/23/2023   GFRNONAA 50 (L) 04/09/2020   Lab Results  Component Value Date   CHOL 129 12/23/2023   HDL 56 12/23/2023   LDLCALC 48 12/23/2023   TRIG 145 12/23/2023   CHOLHDL 2.3 12/23/2023   Lab Results  Component Value Date   TSH 1.590 06/15/2023   Lab Results  Component Value Date   HGBA1C 5.5 02/16/2019   Lab Results  Component Value Date   WBC 8.9 12/23/2023   HGB 13.9 12/23/2023   HCT 42.7 12/23/2023   MCV 91 12/23/2023   PLT 231 12/23/2023   Lab Results  Component Value Date   ALT 15 12/23/2023   AST 23 12/23/2023   ALKPHOS 68 12/23/2023   BILITOT 0.2 12/23/2023   Lab Results  Component Value Date   VD25OH 36.1 12/23/2023     Patient Active Problem List   Diagnosis Date Noted   Subacute cough 08/18/2023   Chronic ischemic left middle cerebral artery (MCA) stroke 09/22/2022   Carotid artery thrombosis, left 09/13/2022   Centrilobular emphysema (HCC) 04/24/2022   Polyp of descending colon    Chronic midline low back pain without sciatica 06/19/2021   Chronic kidney  disease, stage 3b (HCC) 04/19/2021   Atrophic kidney 09/27/2020   Aortic aneurysm, abdominal (HCC) 09/27/2020   Benign essential tremor 04/09/2020   Vitamin D  deficiency 09/06/2019   Other specified anxiety disorders 02/16/2019   Lichen sclerosus et atrophicus of the vulva 11/25/2018   Essential hypertension 11/24/2018   Old lacunar stroke without late effect 11/24/2018   Tobacco abuse, in remission 10/25/2015   Hx of hepatitis C 10/25/2015   CAD in native artery 05/24/2015   Mixed hyperlipidemia 06/12/2011    Allergies  Allergen Reactions   Morphine Hives and Shortness Of Breath    Stopped breathing    Scopolamine Other (See Comments)    Unconscious    Codeine  Nausea Only and Other (See Comments)    NAUSEA/VOMITING    Sulfa Antibiotics Hives and Nausea Only    Other Reaction: Not Assessed     Past Surgical History:  Procedure Laterality Date   CARDIAC ELECTROPHYSIOLOGY STUDY AND ABLATION  2006   for SVT   CATARACT EXTRACTION W/PHACO Right 02/06/2021   Procedure: CATARACT EXTRACTION PHACO AND INTRAOCULAR LENS PLACEMENT (IOC) RIGHT;  Surgeon: Mittie Gaskin, MD;  Location: [facility];  Service: Ophthalmology;  Laterality: Right;  2.85 0:32.6 8.7%   CATARACT EXTRACTION W/PHACO Left 02/20/2021   Procedure: CATARACT EXTRACTION PHACO  AND INTRAOCULAR LENS PLACEMENT (IOC) LEFT;  Surgeon: Mittie Gaskin, MD;  Location: [facility];  Service: Ophthalmology;  Laterality: Left;  3.12 0:39.5   COLONOSCOPY  2006   COLONOSCOPY WITH PROPOFOL  N/A 10/25/2021   Procedure: COLONOSCOPY WITH BIOPSY;  Surgeon: Jinny Carmine, MD;  Location: [facility];  Service: Endoscopy;  Laterality: N/A;   CORONARY ANGIOPLASTY WITH STENT PLACEMENT  2001   to Circumfx   ESOPHAGOGASTRODUODENOSCOPY  2017   LAPAROSCOPIC CHOLECYSTECTOMY  04/25/2019   @ [facility]   LAPAROSCOPIC GASTRIC SLEEVE RESECTION  11/07/2015   POLYPECTOMY N/A 10/25/2021   Procedure: POLYPECTOMY;  Surgeon:  Jinny Carmine, MD;  Location: [facility];  Service: Endoscopy;  Laterality: N/A;   TOTAL ABDOMINAL HYSTERECTOMY  1992   CIS cervix    Social History   Tobacco Use   Smoking status: Former    Current packs/day: 0.00    Average packs/day: 0.5 packs/day for 44.0 years (22.0 ttl pk-yrs)    Types: Cigarettes    Start date: 102    Quit date: 09/13/2022    Years since quitting: 1.4   Smokeless tobacco: Never  Vaping Use   Vaping status: Never Used  Substance Use Topics   Alcohol use: Not Currently    Comment: rare   Drug use: Never     Medication list has been reviewed and updated.  Current Meds  Medication Sig   albuterol  (PROVENTIL ) (2.5 MG/3ML) 0.083% nebulizer solution Take 3 mLs (2.5 mg total) by nebulization every 6 (six) hours as needed for wheezing or shortness of breath.   albuterol  (VENTOLIN  HFA) 108 (90 Base) MCG/ACT inhaler Inhale 2 puffs into the lungs every 6 (six) hours as needed for wheezing or shortness of breath.   amLODipine  (NORVASC ) 5 MG tablet Take 1 tablet (5 mg total) by mouth daily.   aspirin EC 81 MG tablet Take 1 tablet by mouth daily.   cholecalciferol (VITAMIN D3) 25 MCG (1000 UNIT) tablet Take 2,000 Units by mouth daily.   clobetasol  cream (TEMOVATE ) 0.05 % Apply topically 2 (two) times daily.   cyclobenzaprine  (FLEXERIL ) 10 MG tablet Take 1 tablet (10 mg total) by mouth at bedtime.   escitalopram  (LEXAPRO ) 20 MG tablet TAKE 1 TABLET(20 MG) BY MOUTH DAILY   Evolocumab  (REPATHA ) 140 MG/ML SOSY Inject 140 mg into the skin every 14 (fourteen) days.   ipratropium-albuterol  (DUONEB) 0.5-2.5 (3) MG/3ML SOLN Take 3 mLs by nebulization every 4 (four) hours as needed.   ketoconazole  (NIZORAL ) 2 % cream Apply 1 Application topically daily. To both feet   lisinopril  (ZESTRIL ) 20 MG tablet TAKE 1 TABLET BY MOUTH DAILY   Melatonin 10 MG TABS Take 1 tablet by mouth at bedtime.   Multiple Vitamins-Minerals (ALIVE WOMENS ENERGY) TABS Take by mouth.    predniSONE  (DELTASONE ) 10 MG tablet Take 6 tablets (60 mg total) by mouth daily with breakfast for 2 days, THEN 5 tablets (50 mg total) daily with breakfast for 2 days, THEN 4 tablets (40 mg total) daily with breakfast for 2 days, THEN 3 tablets (30 mg total) daily with breakfast for 2 days, THEN 2 tablets (20 mg total) daily with breakfast for 2 days, THEN 1 tablet (10 mg total) daily with breakfast for 2 days.   rosuvastatin  (CRESTOR ) 40 MG tablet TAKE 1 TABLET(40 MG) BY MOUTH DAILY   Vitamin D , Ergocalciferol , (DRISDOL ) 1.25 MG (50000 UNIT) CAPS capsule TAKE 1 CAPSULE BY MOUTH EVERY 7 DAYS       03/09/2024  1:42 PM 02/03/2024    2:42 PM 12/23/2023    3:26 PM 08/10/2023    9:43 AM  GAD 7 : Generalized Anxiety Score  Nervous, Anxious, on Edge 0 0 1 0  Control/stop worrying 0 0 0 0  Worry too much - different things 0 0 0 0  Trouble relaxing 0 1 1 0  Restless 0 0 0 0  Easily annoyed or irritable 0 0 1 0  Afraid - awful might happen 0 0 0 0  Total GAD 7 Score 0 1 3 0  Anxiety Difficulty Not difficult at all Somewhat difficult Somewhat difficult Not difficult at all       03/09/2024    1:42 PM 02/03/2024    2:42 PM 12/23/2023    3:25 PM  Depression screen PHQ 2/9  Decreased Interest 0 0 0  Down, Depressed, Hopeless 0 0 0  PHQ - 2 Score 0 0 0  Altered sleeping 0 1 1  Tired, decreased energy 0 1 2  Change in appetite 0 0 0  Feeling bad or failure about yourself  0 0 0  Trouble concentrating 0 1 0  Moving slowly or fidgety/restless 0 0 0  Suicidal thoughts 0 0 0  PHQ-9 Score 0 3 3  Difficult doing work/chores Not difficult at all Somewhat difficult Not difficult at all    BP Readings from Last 3 Encounters:  03/09/24 102/62  02/03/24 122/76  12/23/23 (!) 150/86    Physical Exam Vitals and nursing note reviewed.  Constitutional:      General: She is not in acute distress.    Appearance: Normal appearance. She is well-developed.  HENT:     Head: Normocephalic and atraumatic.   Cardiovascular:     Rate and Rhythm: Normal rate and regular rhythm.     Heart sounds: No murmur heard. Pulmonary:     Effort: Pulmonary effort is normal. No respiratory distress.     Breath sounds: No wheezing or rhonchi.  Musculoskeletal:     Right shoulder: No swelling, effusion or tenderness. Normal range of motion.     Right upper arm: Tenderness present. No swelling.     Right elbow: No deformity or effusion. Normal range of motion. No tenderness.     Cervical back: Normal range of motion. Tenderness present. No bony tenderness. Pain with movement present.     Right lower leg: No edema.     Left lower leg: No edema.  Skin:    General: Skin is warm and dry.     Findings: No rash.  Neurological:     Mental Status: She is alert and oriented to person, place, and time.  Psychiatric:        Mood and Affect: Mood normal.        Behavior: Behavior normal.     Wt Readings from Last 3 Encounters:  03/09/24 154 lb (69.9 kg)  02/03/24 153 lb 2 oz (69.5 kg)  12/23/23 156 lb 2 oz (70.8 kg)    BP 102/62   Pulse 77   Ht 5' 1 (1.549 m)   Wt 154 lb (69.9 kg)   SpO2 98%   BMI 29.10 kg/m   Assessment and Plan:  Problem List Items Addressed This Visit   None Visit Diagnoses       Tendonitis of shoulder, right    -  Primary   continue heat; add flexeril  qhs Steroid taper see SM next week   Relevant Medications   cyclobenzaprine  (FLEXERIL ) 10  MG tablet   predniSONE  (DELTASONE ) 10 MG tablet       Return in about 1 week (around 03/16/2024) for Dr Alvia - new ortho shoulder/upper arm tendonitis.    Leita HILARIO Adie, MD [facility] [place]

## 2024-03-16 ENCOUNTER — Encounter: Payer: Self-pay | Admitting: Family Medicine

## 2024-03-16 ENCOUNTER — Other Ambulatory Visit (INDEPENDENT_AMBULATORY_CARE_PROVIDER_SITE_OTHER): Payer: Self-pay | Admitting: Radiology

## 2024-03-16 ENCOUNTER — Ambulatory Visit (INDEPENDENT_AMBULATORY_CARE_PROVIDER_SITE_OTHER): Admitting: Family Medicine

## 2024-03-16 VITALS — BP 118/80 | HR 83 | Ht 61.0 in | Wt 157.0 lb

## 2024-03-16 DIAGNOSIS — M7541 Impingement syndrome of right shoulder: Secondary | ICD-10-CM | POA: Insufficient documentation

## 2024-03-16 DIAGNOSIS — M62838 Other muscle spasm: Secondary | ICD-10-CM | POA: Diagnosis not present

## 2024-03-16 MED ORDER — TIZANIDINE HCL 2 MG PO CAPS: 2.0000 mg | ORAL_CAPSULE | Freq: Three times a day (TID) | ORAL | 0 refills | Status: DC | PRN

## 2024-03-16 MED ORDER — TRIAMCINOLONE ACETONIDE 40 MG/ML IJ SUSP
40.0000 mg | Freq: Once | INTRAMUSCULAR | Status: AC
  Administered 2024-03-16: 40 mg via INTRAMUSCULAR

## 2024-03-16 NOTE — Patient Instructions (Signed)
 Patient Plan:  1. Right Shoulder Impingement Syndrome:    - Receive a corticosteroid injection in the right shoulder.    - Rest for two days, avoiding heavy lifting or strenuous activities.    - Start using Voltaren gel (diclofenac 1%) up to four times daily, at least twice daily, after the injection site heals.    - Use tizanidine  during the day and Flexeril  at night as needed for muscle relaxation.    - Begin home exercises sent via MyChart once symptoms improve.    - If symptoms persist after two weeks, further evaluate the biceps tendon.  2. Cervical Paraspinal Muscle Spasm:    - Use tizanidine  during the day and Flexeril  at night as needed.    - Apply dry heat to the affected area for muscle relaxation.  3. Red Flags to Watch For:    - If you experience increased pain, numbness, or tingling, contact the office promptly.  4. Follow-Up:    - Monitor symptoms and follow up as needed based on symptom persistence or changes.

## 2024-03-16 NOTE — Progress Notes (Signed)
 Primary Care / Sports Medicine Office Visit  Patient Information:  Patient ID: Tiffany Bonilla, female DOB: 1955/12/30 Age: 68 y.o. MRN: [id number]   Tiffany Bonilla is a pleasant 68 y.o. female presenting with the following:  Chief Complaint  Patient presents with   Shoulder Pain    Right shoulder pain x 1 week. Pain is constant. Patient is on prednisone  right now which is helping some. No specific aggravating factors. Pain is in deltoid area and radiates up to her cervical region.     Vitals:   03/16/24 1023  BP: 118/80  Pulse: 83  SpO2: 97%   Vitals:   03/16/24 1023  Weight: 157 lb (71.2 kg)  Height: 5' 1 (1.549 m)   Body mass index is 29.66 kg/m.  No results found.   Independent interpretation of notes and tests performed by another provider:   None  Procedures performed:   Procedure:  Injection of right shoulder under ultrasound guidance. Ultrasound guidance utilized for in-plane subacromial approach, supraspinatus visualized with sonographic evidence of tendinopathy Samsung HS60 device utilized with permanent recording / reporting. Verbal informed consent obtained and verified. Skin prepped in a sterile fashion. Ethyl chloride for topical local analgesia.  Completed without difficulty and tolerated well. Medication: triamcinolone  acetonide 40 mg/mL suspension for injection 1 mL total and 2 mL lidocaine  1% without epinephrine  utilized for needle placement anesthetic Advised to contact for fevers/chills, erythema, induration, drainage, or persistent bleeding.   Pertinent History, Exam, Impression, and Recommendations:   Problem List Items Addressed This Visit     Cervical paraspinal muscle spasm   Cervical paraspinal muscle spasm Cervical paraspinal muscle spasm exacerbating shoulder symptoms, no nerve involvement. - Prescribe tizanidine  for daytime use, continue Flexeril  at night. Take these as-needed, can cause drowsiness. - Advise use of  dry heat for muscle relaxation.      Relevant Medications   tizanidine  (ZANAFLEX ) 2 MG capsule   Rotator cuff impingement syndrome of right shoulder - Primary   History of Present Illness Tiffany Bonilla is a 68 year old female who presents with right shoulder pain. She was referred by Dr. Justus for evaluation of right shoulder pain and possible tendonitis.  Right shoulder pain - Onset two weeks ago - Pain described as a 'deep seated bruise' - Located on the outer right shoulder - Pain radiates upwards but not down the arm - Pain sometimes awakens her at night despite medication use - No history of trauma or significant change in activity prior to onset - No prior similar shoulder issues - No numbness or tingling - No radiation of pain down the arm  Response to treatment and symptom management - Prednisone  provides some relief - Flexeril  at night helps with sleep, but pain persists in the morning - Dry heat applied to the shoulder  Relevant musculoskeletal and neurological history - History of cervical spondylosis, diagnosed years ago - Opted out of surgical intervention for cervical spondylosis - Occupational history includes prolonged desk work  Physical Exam VITALS: BP- 118/80  Bilateral Shoulder Exam Inspection: No visible asymmetry, deformity, or atrophy noted bilaterally Palpation: Biceps tendon non-tender bilaterally, Tenderness at the distal aspect of the right deltoid, Right upper trapezius with muscular spasm and mild tenderness Range of motion (ROM) assessment: Bilateral forward flexion nearly symmetric with 5-degree deficit, limited by pain, External rotation symmetric bilaterally with pain elicited at right shoulder, Internal rotation with extension reaches mid upper shoulder blade on left, contralateral side limited to lower shoulder  blade due to pain Muscle strength testing: External rotation 5/5 bilaterally with pain reproducing symptoms, Internal  rotation 5/5 bilaterally without pain, Isolated supraspinatus strength 4/5 on right with pain, contralateral side normal without pain Neurological assessment: Sensation intact to light touch in bilateral upper extremities, No gross motor deficits beyond isolated rotator cuff findings Special tests: Negative Spurling's test bilaterally, Positive Neer's test on right, Positive Hawkins test right, Positive Speed's test, Negative Yergason's test  Assessment and Plan Right shoulder impingement syndrome Right shoulder impingement with supraspinatus and biceps tendon involvement, likely tendonitis. Exacerbated by cervical paraspinal muscle spasm. - Administer corticosteroid injection to the right shoulder using ultrasound guidance. - Advise two days of relative rest, avoiding heavy lifting or strenuous activities. - Start Voltaren gel (diclofenac 1%) up to four times daily, at least twice daily (wait until skin from injection heals). - Prescribe tizanidine  for daytime use, continue Flexeril  at night.  Use these medications as needed. - Send home exercises via MyChart to start once symptoms improve. - Evaluate biceps tendon if symptoms persist after two weeks.  Cervical paraspinal muscle spasm Cervical paraspinal muscle spasm exacerbating shoulder symptoms, no nerve involvement. - Prescribe tizanidine  for daytime use, continue Flexeril  at night. - Advise use of dry heat for muscle relaxation.      Relevant Orders   US  LIMITED JOINT SPACE STRUCTURES UP RIGHT     Orders & Medications Medications:  Meds ordered this encounter  Medications   tizanidine  (ZANAFLEX ) 2 MG capsule    Sig: Take 1 capsule (2 mg total) by mouth 3 (three) times daily as needed for muscle spasms.    Dispense:  90 capsule    Refill:  0   triamcinolone  acetonide (KENALOG -40) injection 40 mg   Orders Placed This Encounter  Procedures   US  LIMITED JOINT SPACE STRUCTURES UP RIGHT     No follow-ups on file.     Selinda JINNY Ku, MD, [facility]   Primary Care Sports Medicine Primary Care and Sports Medicine at [facility]

## 2024-03-16 NOTE — Assessment & Plan Note (Signed)
 Cervical paraspinal muscle spasm Cervical paraspinal muscle spasm exacerbating shoulder symptoms, no nerve involvement. - Prescribe tizanidine  for daytime use, continue Flexeril  at night. Take these as-needed, can cause drowsiness. - Advise use of dry heat for muscle relaxation.

## 2024-03-16 NOTE — Assessment & Plan Note (Addendum)
 History of Present Illness Abigail Teall is a 68 year old female who presents with right shoulder pain. She was referred by Dr. Justus for evaluation of right shoulder pain and possible tendonitis.  Right shoulder pain - Onset two weeks ago - Pain described as a 'deep seated bruise' - Located on the outer right shoulder - Pain radiates upwards but not down the arm - Pain sometimes awakens her at night despite medication use - No history of trauma or significant change in activity prior to onset - No prior similar shoulder issues - No numbness or tingling - No radiation of pain down the arm  Response to treatment and symptom management - Prednisone  provides some relief - Flexeril  at night helps with sleep, but pain persists in the morning - Dry heat applied to the shoulder  Relevant musculoskeletal and neurological history - History of cervical spondylosis, diagnosed years ago - Opted out of surgical intervention for cervical spondylosis - Occupational history includes prolonged desk work  Physical Exam VITALS: BP- 118/80  Bilateral Shoulder Exam Inspection: No visible asymmetry, deformity, or atrophy noted bilaterally Palpation: Biceps tendon non-tender bilaterally, Tenderness at the distal aspect of the right deltoid, Right upper trapezius with muscular spasm and mild tenderness Range of motion (ROM) assessment: Bilateral forward flexion nearly symmetric with 5-degree deficit, limited by pain, External rotation symmetric bilaterally with pain elicited at right shoulder, Internal rotation with extension reaches mid upper shoulder blade on left, contralateral side limited to lower shoulder blade due to pain Muscle strength testing: External rotation 5/5 bilaterally with pain reproducing symptoms, Internal rotation 5/5 bilaterally without pain, Isolated supraspinatus strength 4/5 on right with pain, contralateral side normal without pain Neurological assessment: Sensation  intact to light touch in bilateral upper extremities, No gross motor deficits beyond isolated rotator cuff findings Special tests: Negative Spurling's test bilaterally, Positive Neer's test on right, Positive Hawkins test right, Positive Speed's test, Negative Yergason's test  Assessment and Plan Right shoulder impingement syndrome Right shoulder impingement with supraspinatus and biceps tendon involvement, likely tendonitis. Exacerbated by cervical paraspinal muscle spasm. - Administer corticosteroid injection to the right shoulder using ultrasound guidance. - Advise two days of relative rest, avoiding heavy lifting or strenuous activities. - Start Voltaren gel (diclofenac 1%) up to four times daily, at least twice daily (wait until skin from injection heals). - Prescribe tizanidine  for daytime use, continue Flexeril  at night.  Use these medications as needed. - Send home exercises via MyChart to start once symptoms improve. - Evaluate biceps tendon if symptoms persist after two weeks.  Cervical paraspinal muscle spasm Cervical paraspinal muscle spasm exacerbating shoulder symptoms, no nerve involvement. - Prescribe tizanidine  for daytime use, continue Flexeril  at night. - Advise use of dry heat for muscle relaxation.

## 2024-03-18 ENCOUNTER — Other Ambulatory Visit: Payer: Self-pay | Admitting: Internal Medicine

## 2024-03-18 DIAGNOSIS — M778 Other enthesopathies, not elsewhere classified: Secondary | ICD-10-CM

## 2024-03-20 ENCOUNTER — Encounter: Payer: Self-pay | Admitting: Family Medicine

## 2024-03-21 ENCOUNTER — Other Ambulatory Visit: Payer: Self-pay

## 2024-03-21 DIAGNOSIS — M62838 Other muscle spasm: Secondary | ICD-10-CM

## 2024-03-21 MED ORDER — TIZANIDINE HCL 2 MG PO CAPS
2.0000 mg | ORAL_CAPSULE | Freq: Three times a day (TID) | ORAL | 0 refills | Status: DC | PRN
Start: 2024-03-21 — End: 2024-05-27

## 2024-03-21 NOTE — Telephone Encounter (Signed)
 Requested medication (s) are due for refill today: review  Requested medication (s) are on the active medication list: yes  Last refill:  03/09/24 #20/0  Future visit scheduled: yes  Notes to clinic:  Unable to refill per protocol, cannot delegate.      Requested Prescriptions  Pending Prescriptions Disp Refills   cyclobenzaprine  (FLEXERIL ) 10 MG tablet [Pharmacy Med Name: CYCLOBENZAPRINE  10MG  TABLETS] 20 tablet 0    Sig: TAKE 1 TABLET(10 MG) BY MOUTH AT BEDTIME     Not Delegated - Analgesics:  Muscle Relaxants Failed - 03/21/2024 11:19 AM      Failed - This refill cannot be delegated      Passed - Valid encounter within last 6 months    Recent Outpatient Visits           5 days ago Rotator cuff impingement syndrome of right shoulder   [place] [facility] at [facility], Selinda PARAS, MD   1 week ago Tendonitis of shoulder, right   [facility] at [facility], Leita DEL, MD   1 month ago Essential hypertension   [place] [facility] at [facility], Leita DEL, MD   2 months ago Acute cystitis with hematuria   [facility] & Sports Medicine at [facility], Leita DEL, MD       Future Appointments             In 2 months Justus, Leita DEL, MD [facility] Primary Care & Sports Medicine at [facility], [facility]

## 2024-03-21 NOTE — Telephone Encounter (Signed)
 Please review.  KP

## 2024-04-11 DIAGNOSIS — M531 Cervicobrachial syndrome: Secondary | ICD-10-CM | POA: Diagnosis not present

## 2024-04-11 DIAGNOSIS — M9903 Segmental and somatic dysfunction of lumbar region: Secondary | ICD-10-CM | POA: Diagnosis not present

## 2024-04-11 DIAGNOSIS — M546 Pain in thoracic spine: Secondary | ICD-10-CM | POA: Diagnosis not present

## 2024-04-11 DIAGNOSIS — M9902 Segmental and somatic dysfunction of thoracic region: Secondary | ICD-10-CM | POA: Diagnosis not present

## 2024-04-11 DIAGNOSIS — M545 Low back pain, unspecified: Secondary | ICD-10-CM | POA: Diagnosis not present

## 2024-04-11 DIAGNOSIS — M9901 Segmental and somatic dysfunction of cervical region: Secondary | ICD-10-CM | POA: Diagnosis not present

## 2024-04-13 DIAGNOSIS — Z0100 Encounter for examination of eyes and vision without abnormal findings: Secondary | ICD-10-CM | POA: Diagnosis not present

## 2024-04-13 DIAGNOSIS — H5203 Hypermetropia, bilateral: Secondary | ICD-10-CM | POA: Diagnosis not present

## 2024-04-13 DIAGNOSIS — M531 Cervicobrachial syndrome: Secondary | ICD-10-CM | POA: Diagnosis not present

## 2024-04-13 DIAGNOSIS — M9903 Segmental and somatic dysfunction of lumbar region: Secondary | ICD-10-CM | POA: Diagnosis not present

## 2024-04-13 DIAGNOSIS — M546 Pain in thoracic spine: Secondary | ICD-10-CM | POA: Diagnosis not present

## 2024-04-13 DIAGNOSIS — G250 Essential tremor: Secondary | ICD-10-CM | POA: Diagnosis not present

## 2024-04-13 DIAGNOSIS — F329 Major depressive disorder, single episode, unspecified: Secondary | ICD-10-CM | POA: Diagnosis not present

## 2024-04-13 DIAGNOSIS — Z1331 Encounter for screening for depression: Secondary | ICD-10-CM | POA: Diagnosis not present

## 2024-04-13 DIAGNOSIS — M545 Low back pain, unspecified: Secondary | ICD-10-CM | POA: Diagnosis not present

## 2024-04-13 DIAGNOSIS — G479 Sleep disorder, unspecified: Secondary | ICD-10-CM | POA: Diagnosis not present

## 2024-04-13 DIAGNOSIS — M9902 Segmental and somatic dysfunction of thoracic region: Secondary | ICD-10-CM | POA: Diagnosis not present

## 2024-04-13 DIAGNOSIS — M9901 Segmental and somatic dysfunction of cervical region: Secondary | ICD-10-CM | POA: Diagnosis not present

## 2024-04-13 DIAGNOSIS — Z8673 Personal history of transient ischemic attack (TIA), and cerebral infarction without residual deficits: Secondary | ICD-10-CM | POA: Diagnosis not present

## 2024-04-20 ENCOUNTER — Encounter

## 2024-05-05 ENCOUNTER — Ambulatory Visit
Admission: RE | Admit: 2024-05-05 | Discharge: 2024-05-05 | Disposition: A | Discharge status: Home / Self Care | Attending: Family Medicine | Admitting: Family Medicine

## 2024-05-05 ENCOUNTER — Ambulatory Visit (INDEPENDENT_AMBULATORY_CARE_PROVIDER_SITE_OTHER): Admitting: Family Medicine

## 2024-05-05 ENCOUNTER — Ambulatory Visit
Admission: RE | Admit: 2024-05-05 | Discharge: 2024-05-05 | Disposition: A | Discharge status: Home / Self Care | Source: Ambulatory Visit | Attending: Family Medicine | Admitting: Family Medicine

## 2024-05-05 ENCOUNTER — Encounter: Payer: Self-pay | Admitting: Family Medicine

## 2024-05-05 VITALS — BP 146/88 | HR 63 | Ht 61.0 in | Wt 152.2 lb

## 2024-05-05 DIAGNOSIS — M4722 Other spondylosis with radiculopathy, cervical region: Secondary | ICD-10-CM | POA: Diagnosis not present

## 2024-05-05 DIAGNOSIS — M542 Cervicalgia: Secondary | ICD-10-CM | POA: Diagnosis not present

## 2024-05-05 DIAGNOSIS — M4802 Spinal stenosis, cervical region: Secondary | ICD-10-CM | POA: Diagnosis not present

## 2024-05-05 MED ORDER — PREDNISONE 20 MG PO TABS: 20.0000 mg | ORAL_TABLET | Freq: Two times a day (BID) | ORAL | 0 refills | Status: AC

## 2024-05-05 MED ORDER — OXYCODONE-ACETAMINOPHEN 5-325 MG PO TABS: 1.0000 | ORAL_TABLET | Freq: Three times a day (TID) | ORAL | 0 refills | Status: AC | PRN

## 2024-05-05 MED ORDER — CYCLOBENZAPRINE HCL 10 MG PO TABS: 10.0000 mg | ORAL_TABLET | Freq: Every day | ORAL | 0 refills | Status: DC

## 2024-05-05 NOTE — Progress Notes (Signed)
 Primary Care / Sports Medicine Office Visit  Patient Information:  Patient ID: Tiffany Bonilla, female DOB: 1955-10-12 Age: 68 y.o. MRN: [id number]   Tiffany Bonilla is a pleasant 68 y.o. female presenting with the following:  Chief Complaint  Patient presents with   Neck Pain    Patient presents today for neck pain that radiates into her right arm for the past 3 days. Unable to sleep because of pain and numbness. Patient has been seeing a chiropractor but she thinks she may have slept wrong and that is what has caused her flare up. She has been taking ibuprofen but it is not touching her pain. Pain is a 10/10 for the last 3 days.    Vitals:   05/05/24 1621  BP: (!) 146/88  Pulse: 63  SpO2: 97%   Vitals:   05/05/24 1621  Weight: 152 lb 3.2 oz (69 kg)  Height: 5' 1 (1.549 m)   Body mass index is 28.76 kg/m.  No results found.   Independent interpretation of notes and tests performed by another provider:   None  Procedures performed:   None  Pertinent History, Exam, Impression, and Recommendations:   Problem List Items Addressed This Visit     Cervical spondylosis with radiculopathy - Primary   History of Present Illness Tiffany Bonilla is a 68 year old female with cervical spondylosis who presents with numbness in her right hand and neck pain. She was referred by Dr. Justus for further evaluation and management of her cervical spondylosis.  Cervical radiculopathy and neck pain - Numbness in right hand for the past three days - Associated neck pain present for the same duration - Tight muscles in the neck and shoulder region - History of cervical spondylosis with previously obtained but now outdated cervical spine x-rays  Prior shoulder pathology - History of rotator cuff tendinitis - Previously received medication around the rotator cuff for tendinitis - Current symptoms differ from prior shoulder issues  Response to medications -  Flexeril  (muscle relaxer) provides effective relief, especially at night - Prednisone  previously provided some relief - Tizanidine  was previously prescribed but was not effective  Physical Exam CERVICAL SPINE / UPPER BACK INSPECTION: No erythema, deformity, or skin changes PALPATION: Mild tenderness along the inner aspect of the right scapular border; remainder of paraspinal and trapezial regions benign RANGE OF MOTION: Cervical motion limited with mild stiffness with rotation to the right STRENGTH: 5/5 in bilateral upper extremities, symmetric NEUROVASCULAR: Brachioradialis reflexes slightly diminished on the right 1+ compared to the left 2+; sensation intact to light touch in bilateral upper extremities SPECIAL TESTS: Spurling's test positive on the right; negative on the left  Assessment and Plan Cervical spondylosis with right-sided radiculopathy Chronic cervical spondylosis with acute right-sided radiculopathy. Positive Spurling's test confirms cervical radiculopathy. New imaging required. Conservative management preferred. - Order cervical spine x-ray and MRI. - Refer to interventional spine group for potential cervical injections. - Prescribe Flexeril  (cyclobenzaprine ) up to three times daily as needed. - Prescribe oral prednisone  for a 5-7 day course. - Prescribe Percocet (oxycodone /acetaminophen ) for severe pain as needed.      Relevant Medications   oxyCODONE -acetaminophen  (PERCOCET/ROXICET) 5-325 MG tablet   cyclobenzaprine  (FLEXERIL ) 10 MG tablet   predniSONE  (DELTASONE ) 20 MG tablet   Other Relevant Orders   DG Cervical Spine Complete   Ambulatory referral to Pain Clinic     Orders & Medications Medications:  Meds ordered this encounter  Medications   oxyCODONE -acetaminophen  (  PERCOCET/ROXICET) 5-325 MG tablet    Sig: Take 1 tablet by mouth every 8 (eight) hours as needed for up to 5 days.    Dispense:  15 tablet    Refill:  0   cyclobenzaprine  (FLEXERIL ) 10 MG  tablet    Sig: Take 1 tablet (10 mg total) by mouth at bedtime.    Dispense:  30 tablet    Refill:  0   predniSONE  (DELTASONE ) 20 MG tablet    Sig: Take 1 tablet (20 mg total) by mouth 2 (two) times daily with a meal for 7 days.    Dispense:  14 tablet    Refill:  0   Orders Placed This Encounter  Procedures   DG Cervical Spine Complete   Ambulatory referral to Pain Clinic     No follow-ups on file.     Selinda JINNY Ku, MD, [facility]   Primary Care Sports Medicine Primary Care and Sports Medicine at [facility]

## 2024-05-05 NOTE — Assessment & Plan Note (Signed)
 History of Present Illness Tiffany Bonilla is a 68 year old female with cervical spondylosis who presents with numbness in her right hand and neck pain. She was referred by Dr. Justus for further evaluation and management of her cervical spondylosis.  Cervical radiculopathy and neck pain - Numbness in right hand for the past three days - Associated neck pain present for the same duration - Tight muscles in the neck and shoulder region - History of cervical spondylosis with previously obtained but now outdated cervical spine x-rays  Prior shoulder pathology - History of rotator cuff tendinitis - Previously received medication around the rotator cuff for tendinitis - Current symptoms differ from prior shoulder issues  Response to medications - Flexeril  (muscle relaxer) provides effective relief, especially at night - Prednisone  previously provided some relief - Tizanidine  was previously prescribed but was not effective  Physical Exam CERVICAL SPINE / UPPER BACK INSPECTION: No erythema, deformity, or skin changes PALPATION: Mild tenderness along the inner aspect of the right scapular border; remainder of paraspinal and trapezial regions benign RANGE OF MOTION: Cervical motion limited with mild stiffness with rotation to the right STRENGTH: 5/5 in bilateral upper extremities, symmetric NEUROVASCULAR: Brachioradialis reflexes slightly diminished on the right 1+ compared to the left 2+; sensation intact to light touch in bilateral upper extremities SPECIAL TESTS: Spurling's test positive on the right; negative on the left  Assessment and Plan Cervical spondylosis with right-sided radiculopathy Chronic cervical spondylosis with acute right-sided radiculopathy. Positive Spurling's test confirms cervical radiculopathy. New imaging required. Conservative management preferred. - Order cervical spine x-ray and MRI. - Refer to interventional spine group for potential cervical  injections. - Prescribe Flexeril  (cyclobenzaprine ) up to three times daily as needed. - Prescribe oral prednisone  for a 5-7 day course. - Prescribe Percocet (oxycodone /acetaminophen ) for severe pain as needed.

## 2024-05-05 NOTE — Patient Instructions (Signed)
 Patient Plan for Post-Visit Guidance  Cervical Spondylosis with Right-Sided Radiculopathy  - Cervical spine x-ray and MRI will be ordered. - Referral to interventional spine group for possible cervical injections. - Take Flexeril  (cyclobenzaprine ) up to three times daily as needed for muscle spasms. - Take oral prednisone  as prescribed for 5-7 days. - Take Percocet (oxycodone /acetaminophen ) as needed for severe pain.  Red Flags  - Seek medical attention if you experience sudden weakness, loss of coordination, loss of bladder or bowel control, severe neck pain, or any new or worsening symptoms.

## 2024-05-25 ENCOUNTER — Ambulatory Visit
Admission: RE | Admit: 2024-05-25 | Discharge: 2024-05-25 | Disposition: A | Discharge status: Home / Self Care | Source: Ambulatory Visit | Attending: Family Medicine | Admitting: Family Medicine

## 2024-05-25 ENCOUNTER — Other Ambulatory Visit: Payer: Self-pay | Admitting: Internal Medicine

## 2024-05-25 DIAGNOSIS — F418 Other specified anxiety disorders: Secondary | ICD-10-CM

## 2024-05-25 DIAGNOSIS — M4802 Spinal stenosis, cervical region: Secondary | ICD-10-CM | POA: Diagnosis not present

## 2024-05-25 DIAGNOSIS — M47812 Spondylosis without myelopathy or radiculopathy, cervical region: Secondary | ICD-10-CM | POA: Diagnosis not present

## 2024-05-25 DIAGNOSIS — M50221 Other cervical disc displacement at C4-C5 level: Secondary | ICD-10-CM | POA: Diagnosis not present

## 2024-05-25 DIAGNOSIS — M4803 Spinal stenosis, cervicothoracic region: Secondary | ICD-10-CM | POA: Diagnosis not present

## 2024-05-25 DIAGNOSIS — M4722 Other spondylosis with radiculopathy, cervical region: Secondary | ICD-10-CM | POA: Insufficient documentation

## 2024-05-26 NOTE — Telephone Encounter (Signed)
 Requested medications are due for refill today.  unsure  Requested medications are on the active medications list.  yes  Last refill. 05/2023  Future visit scheduled.   yes  Notes to clinic.  Med has not been ordered since 05/2023.    Requested Prescriptions  Pending Prescriptions Disp Refills   escitalopram  (LEXAPRO ) 20 MG tablet [Pharmacy Med Name: ESCITALOPRAM  20MG  TABLETS] 30 tablet     Sig: TAKE 1 TABLET(20 MG) BY MOUTH DAILY     Psychiatry:  Antidepressants - SSRI Passed - 05/26/2024  1:53 PM      Passed - Valid encounter within last 6 months    Recent Outpatient Visits           3 weeks ago Cervical spondylosis with radiculopathy   [place] [facility], Selinda PARAS, MD   2 months ago Rotator cuff impingement syndrome of right shoulder   [place] [facility], Selinda PARAS, MD   2 months ago Tendonitis of shoulder, right   [facility] at [facility], Leita DEL, MD   3 months ago Essential hypertension   [place] [facility] Medicine at [facility], Leita DEL, MD   5 months ago Acute cystitis with hematuria   [facility] Medicine at [facility], Leita DEL, MD       Future Appointments             In 1 week Justus, Leita DEL, MD [facility] Primary [facility] & [facility] Medicine at [facility], [phone] [place]

## 2024-05-26 NOTE — Telephone Encounter (Signed)
 Please review. Last filled in 2024.  KP

## 2024-05-27 ENCOUNTER — Other Ambulatory Visit: Payer: Self-pay | Admitting: Internal Medicine

## 2024-05-27 NOTE — Progress Notes (Unsigned)
 Date:  05/27/2024   Name:  Tiffany Bonilla   DOB:  [birth year]   MRN:  [id number]   Chief Complaint: No chief complaint on file.  HPI  Review of Systems   Lab Results  Component Value Date   NA 142 12/23/2023   K 3.9 12/23/2023   CO2 24 12/23/2023   GLUCOSE 94 12/23/2023   BUN 11 12/23/2023   CREATININE 1.11 (H) 12/23/2023   CALCIUM  9.3 12/23/2023   EGFR 54 (L) 12/23/2023   GFRNONAA 50 (L) 04/09/2020   Lab Results  Component Value Date   CHOL 129 12/23/2023   HDL 56 12/23/2023   LDLCALC 48 12/23/2023   TRIG 145 12/23/2023   CHOLHDL 2.3 12/23/2023   Lab Results  Component Value Date   TSH 1.590 06/15/2023   Lab Results  Component Value Date   HGBA1C 5.5 02/16/2019   Lab Results  Component Value Date   WBC 8.9 12/23/2023   HGB 13.9 12/23/2023   HCT 42.7 12/23/2023   MCV 91 12/23/2023   PLT 231 12/23/2023   Lab Results  Component Value Date   ALT 15 12/23/2023   AST 23 12/23/2023   ALKPHOS 68 12/23/2023   BILITOT 0.2 12/23/2023   Lab Results  Component Value Date   VD25OH 36.1 12/23/2023     Patient Active Problem List   Diagnosis Date Noted   Cervical spondylosis with radiculopathy 05/05/2024   Rotator cuff impingement syndrome of right shoulder 03/16/2024   Cervical paraspinal muscle spasm 03/16/2024   Subacute cough 08/18/2023   Chronic ischemic left middle cerebral artery (MCA) stroke 09/22/2022   Carotid artery thrombosis, left 09/13/2022   Centrilobular emphysema (HCC) 04/24/2022   Polyp of descending colon    Chronic midline low back pain without sciatica 06/19/2021   Chronic kidney disease, stage 3b (HCC) 04/19/2021   Atrophic kidney 09/27/2020   Aortic aneurysm, abdominal 09/27/2020   Benign essential tremor 04/09/2020   Vitamin D  deficiency 09/06/2019   Other specified anxiety disorders 02/16/2019   Lichen sclerosus et atrophicus of the vulva 11/25/2018   Essential hypertension 11/24/2018   Old lacunar stroke without late  effect 11/24/2018   Tobacco abuse, in remission 10/25/2015   Hx of hepatitis C 10/25/2015   CAD in native artery 05/24/2015   Mixed hyperlipidemia 06/12/2011    Allergies  Allergen Reactions   Morphine Hives and Shortness Of Breath    Stopped breathing    Scopolamine Other (See Comments)    Unconscious    Codeine  Nausea Only and Other (See Comments)    NAUSEA/VOMITING    Sulfa Antibiotics Hives and Nausea Only    Other Reaction: Not Assessed     Past Surgical History:  Procedure Laterality Date   CARDIAC ELECTROPHYSIOLOGY STUDY AND ABLATION  2006   for SVT   CATARACT EXTRACTION W/PHACO Right 02/06/2021   Procedure: CATARACT EXTRACTION PHACO AND INTRAOCULAR LENS PLACEMENT (IOC) RIGHT;  Surgeon: Mittie Gaskin, MD;  Location: [facility];  Service: Ophthalmology;  Laterality: Right;  2.85 0:32.6 8.7%   CATARACT EXTRACTION W/PHACO Left 02/20/2021   Procedure: CATARACT EXTRACTION PHACO AND INTRAOCULAR LENS PLACEMENT (IOC) LEFT;  Surgeon: Mittie Gaskin, MD;  Location: [facility];  Service: Ophthalmology;  Laterality: Left;  3.12 0:39.5   COLONOSCOPY  2006   COLONOSCOPY WITH PROPOFOL  N/A 10/25/2021   Procedure: COLONOSCOPY WITH BIOPSY;  Surgeon: Jinny Carmine, MD;  Location: [facility];  Service: Endoscopy;  Laterality: N/A;   CORONARY ANGIOPLASTY WITH  STENT PLACEMENT  2001   to Circumfx   ESOPHAGOGASTRODUODENOSCOPY  2017   LAPAROSCOPIC CHOLECYSTECTOMY  04/25/2019   @ [facility]   LAPAROSCOPIC GASTRIC SLEEVE RESECTION  11/07/2015   POLYPECTOMY N/A 10/25/2021   Procedure: POLYPECTOMY;  Surgeon: Jinny Carmine, MD;  Location: [facility];  Service: Endoscopy;  Laterality: N/A;   TOTAL ABDOMINAL HYSTERECTOMY  1992   CIS cervix    Social History   Tobacco Use   Smoking status: Former    Current packs/day: 0.00    Average packs/day: 0.5 packs/day for 44.0 years (22.0 ttl pk-yrs)    Types: Cigarettes    Start date: 13    Quit date:  09/13/2022    Years since quitting: 1.7   Smokeless tobacco: Never  Vaping Use   Vaping status: Never Used  Substance Use Topics   Alcohol use: Not Currently    Comment: rare   Drug use: Never     Medication list has been reviewed and updated.  No outpatient medications have been marked as taking for the 05/27/24 encounter (Orders Only) with Justus Leita DEL, MD.       03/09/2024    1:42 PM 02/03/2024    2:42 PM 12/23/2023    3:26 PM 08/10/2023    9:43 AM  GAD 7 : Generalized Anxiety Score  Nervous, Anxious, on Edge 0 0 1 0  Control/stop worrying 0 0 0 0  Worry too much - different things 0 0 0 0  Trouble relaxing 0 1 1 0  Restless 0 0 0 0  Easily annoyed or irritable 0 0 1 0  Afraid - awful might happen 0 0 0 0  Total GAD 7 Score 0 1 3 0  Anxiety Difficulty Not difficult at all Somewhat difficult Somewhat difficult Not difficult at all       05/05/2024    4:26 PM 03/16/2024   10:32 AM 03/09/2024    1:42 PM  Depression screen PHQ 2/9  Decreased Interest 0 0 0  Down, Depressed, Hopeless 0 0 0  PHQ - 2 Score 0 0 0  Altered sleeping   0  Tired, decreased energy   0  Change in appetite   0  Feeling bad or failure about yourself    0  Trouble concentrating   0  Moving slowly or fidgety/restless   0  Suicidal thoughts   0  PHQ-9 Score   0  Difficult doing work/chores   Not difficult at all    BP Readings from Last 3 Encounters:  05/05/24 (!) 146/88  03/16/24 118/80  03/09/24 102/62    Physical Exam  Wt Readings from Last 3 Encounters:  05/05/24 152 lb 3.2 oz (69 kg)  03/16/24 157 lb (71.2 kg)  03/09/24 154 lb (69.9 kg)    There were no vitals taken for this visit.  Assessment and Plan:  Problem List Items Addressed This Visit   None   No follow-ups on file.    Leita HILARIO Justus, MD [facility] [place]

## 2024-05-29 ENCOUNTER — Ambulatory Visit: Payer: Self-pay | Admitting: Family Medicine

## 2024-06-02 ENCOUNTER — Other Ambulatory Visit: Payer: Self-pay | Admitting: Internal Medicine

## 2024-06-02 DIAGNOSIS — I10 Essential (primary) hypertension: Secondary | ICD-10-CM

## 2024-06-03 NOTE — Telephone Encounter (Signed)
 Requested Prescriptions  Pending Prescriptions Disp Refills   lisinopril  (ZESTRIL ) 20 MG tablet [Pharmacy Med Name: LISINOPRIL  20MG  TABLETS] 90 tablet 0    Sig: TAKE 1 TABLET BY MOUTH DAILY     Cardiovascular:  ACE Inhibitors Failed - 06/03/2024 11:47 AM      Failed - Cr in normal range and within 180 days    Creatinine, Ser  Date Value Ref Range Status  12/23/2023 1.11 (H) 0.57 - 1.00 mg/dL Final         Failed - Last BP in normal range    BP Readings from Last 1 Encounters:  05/05/24 (!) 146/88         Passed - K in normal range and within 180 days    Potassium  Date Value Ref Range Status  12/23/2023 3.9 3.5 - 5.2 mmol/L Final         Passed - Patient is not pregnant      Passed - Valid encounter within last 6 months    Recent Outpatient Visits           4 weeks ago Cervical spondylosis with radiculopathy   [place] [facility] [place], Selinda PARAS, MD   2 months ago Rotator cuff impingement syndrome of right shoulder   [place] [facility], Selinda PARAS, MD   2 months ago Tendonitis of shoulder, right   [facility] at [facility], Leita DEL, MD   4 months ago Essential hypertension   [place] [facility] at [facility], Leita DEL, MD   5 months ago Acute cystitis with hematuria   [facility] at [facility], Leita DEL, MD       Future Appointments             In 5 days Justus Leita DEL, MD [facility] at [facility], [phone] [place]             amLODipine  (NORVASC ) 5 MG tablet [Pharmacy Med Name: AMLODIPINE  BESYLATE 5MG  TABLETS] 90 tablet 0    Sig: TAKE 1 TABLET(5 MG) BY MOUTH DAILY     Cardiovascular: Calcium  Channel Blockers 2 Failed - 06/03/2024 11:47 AM      Failed - Last BP in normal range    BP Readings from Last 1  Encounters:  05/05/24 (!) 146/88         Passed - Last Heart Rate in normal range    Pulse Readings from Last 1 Encounters:  05/05/24 63         Passed - Valid encounter within last 6 months    Recent Outpatient Visits           4 weeks ago Cervical spondylosis with radiculopathy   [place] [facility] at [facility], Selinda PARAS, MD   2 months ago Rotator cuff impingement syndrome of right shoulder   [place] [facility], Selinda PARAS, MD   2 months ago Tendonitis of shoulder, right   [facility] at [facility], Leita DEL, MD   4 months ago Essential hypertension   [place] [facility] at [facility], Leita DEL, MD   5 months ago Acute cystitis with hematuria   [facility] at [facility], Leita DEL, MD       Future Appointments             In 5 days Justus Leita DEL, MD [facility] at [facility], [phone] [place]

## 2024-06-08 ENCOUNTER — Ambulatory Visit (INDEPENDENT_AMBULATORY_CARE_PROVIDER_SITE_OTHER): Admitting: Internal Medicine

## 2024-06-08 ENCOUNTER — Encounter: Payer: Self-pay | Admitting: Internal Medicine

## 2024-06-08 VITALS — BP 132/74 | HR 88 | Ht 61.0 in | Wt 155.0 lb

## 2024-06-08 DIAGNOSIS — Z23 Encounter for immunization: Secondary | ICD-10-CM

## 2024-06-08 DIAGNOSIS — N1832 Chronic kidney disease, stage 3b: Secondary | ICD-10-CM

## 2024-06-08 DIAGNOSIS — Z131 Encounter for screening for diabetes mellitus: Secondary | ICD-10-CM

## 2024-06-08 DIAGNOSIS — I2510 Atherosclerotic heart disease of native coronary artery without angina pectoris: Secondary | ICD-10-CM | POA: Diagnosis not present

## 2024-06-08 DIAGNOSIS — Z0000 Encounter for general adult medical examination without abnormal findings: Secondary | ICD-10-CM

## 2024-06-08 DIAGNOSIS — E782 Mixed hyperlipidemia: Secondary | ICD-10-CM

## 2024-06-08 DIAGNOSIS — I10 Essential (primary) hypertension: Secondary | ICD-10-CM

## 2024-06-08 DIAGNOSIS — M4722 Other spondylosis with radiculopathy, cervical region: Secondary | ICD-10-CM

## 2024-06-08 DIAGNOSIS — Z1231 Encounter for screening mammogram for malignant neoplasm of breast: Secondary | ICD-10-CM | POA: Diagnosis not present

## 2024-06-08 DIAGNOSIS — F411 Generalized anxiety disorder: Secondary | ICD-10-CM

## 2024-06-08 DIAGNOSIS — G250 Essential tremor: Secondary | ICD-10-CM

## 2024-06-08 DIAGNOSIS — I714 Abdominal aortic aneurysm, without rupture, unspecified: Secondary | ICD-10-CM

## 2024-06-08 MED ORDER — CYCLOBENZAPRINE HCL 10 MG PO TABS: 10.0000 mg | ORAL_TABLET | Freq: Every day | ORAL | 0 refills | Status: AC

## 2024-06-08 MED ORDER — LISINOPRIL 20 MG PO TABS: 20.0000 mg | ORAL_TABLET | Freq: Every day | ORAL | 0 refills | Status: AC

## 2024-06-08 MED ORDER — AMLODIPINE BESYLATE 5 MG PO TABS: 5.0000 mg | ORAL_TABLET | Freq: Every day | ORAL | 0 refills | Status: AC

## 2024-06-08 MED ORDER — ESCITALOPRAM OXALATE 20 MG PO TABS
ORAL_TABLET | ORAL | 1 refills | Status: AC
Start: 2024-06-08 — End: ?

## 2024-06-08 NOTE — Patient Instructions (Signed)
 Call [facility] to schedule your mammogram at [phone].

## 2024-06-08 NOTE — Assessment & Plan Note (Signed)
 Well controlled blood pressure today. Current regimen is lisinopril  and amlodipine . No medication side effects noted.

## 2024-06-08 NOTE — Progress Notes (Signed)
 Date:  06/08/2024   Name:  Tiffany Bonilla   DOB:  26-Feb-1956   MRN:  [id number]   Chief Complaint: Annual Exam Tiffany Bonilla is a 68 y.o. female who presents today for her Complete Annual Exam. She feels well. She reports exercising 1-2 weekly. She reports she is sleeping well. Breast complaints none.  Health Maintenance  Topic Date Due   DTaP/Tdap/Td vaccine (2 - Td or Tdap) 10/30/2020   Zoster (Shingles) Vaccine (2 of 2) 06/13/2021   Breast Cancer Screening  06/26/2023   Medicare Annual Wellness Visit  11/12/2023   COVID-19 Vaccine (5 - 2025-26 season) 06/24/2024*   Colon Cancer Screening  10/25/2024   Screening for Lung Cancer  11/10/2024   DEXA scan (bone density measurement)  05/01/2026   Pneumococcal Vaccine for age over 71  Completed   Flu Shot  Completed   Hepatitis C Screening  Completed   Meningitis B Vaccine  Aged Out  *Topic was postponed. The date shown is not the original due date.    Hypertension This is a chronic problem. The problem is controlled. Pertinent negatives include no chest pain, headaches, palpitations or shortness of breath. Past treatments include ACE inhibitors and calcium  channel blockers. The current treatment provides significant improvement. Hypertensive end-organ damage includes kidney disease and CAD/MI. There is no history of CVA.  Hyperlipidemia This is a chronic problem. Pertinent negatives include no chest pain, myalgias or shortness of breath. Current antihyperlipidemic treatment includes statins (stopped repatha  due to cost). The current treatment provides significant improvement of lipids.    Review of Systems  Constitutional:  Negative for fatigue and unexpected weight change.  HENT:  Negative for trouble swallowing.   Eyes:  Negative for visual disturbance.  Respiratory:  Negative for cough, chest tightness, shortness of breath and wheezing.   Cardiovascular:  Negative for chest pain, palpitations and leg swelling.   Gastrointestinal:  Negative for abdominal pain, constipation and diarrhea.  Endocrine: Negative for polyuria.  Genitourinary:  Negative for dysuria and urgency.  Musculoskeletal:  Negative for arthralgias and myalgias.  Skin:  Negative for color change and rash.  Allergic/Immunologic: Negative for environmental allergies.  Neurological:  Negative for dizziness, weakness, light-headedness and headaches.  Psychiatric/Behavioral:  Negative for dysphoric mood and sleep disturbance. The patient is not nervous/anxious.      Lab Results  Component Value Date   NA 142 12/23/2023   K 3.9 12/23/2023   CO2 24 12/23/2023   GLUCOSE 94 12/23/2023   BUN 11 12/23/2023   CREATININE 1.11 (H) 12/23/2023   CALCIUM  9.3 12/23/2023   EGFR 54 (L) 12/23/2023   GFRNONAA 50 (L) 04/09/2020   Lab Results  Component Value Date   CHOL 129 12/23/2023   HDL 56 12/23/2023   LDLCALC 48 12/23/2023   TRIG 145 12/23/2023   CHOLHDL 2.3 12/23/2023   Lab Results  Component Value Date   TSH 1.590 06/15/2023   Lab Results  Component Value Date   HGBA1C 5.5 02/16/2019   Lab Results  Component Value Date   WBC 8.9 12/23/2023   HGB 13.9 12/23/2023   HCT 42.7 12/23/2023   MCV 91 12/23/2023   PLT 231 12/23/2023   Lab Results  Component Value Date   ALT 15 12/23/2023   AST 23 12/23/2023   ALKPHOS 68 12/23/2023   BILITOT 0.2 12/23/2023   Lab Results  Component Value Date   VD25OH 36.1 12/23/2023     Patient Active Problem List   Diagnosis  Date Noted   Cervical spondylosis with radiculopathy 05/05/2024   Rotator cuff impingement syndrome of right shoulder 03/16/2024   Cervical paraspinal muscle spasm 03/16/2024   Subacute cough 08/18/2023   Chronic ischemic left middle cerebral artery (MCA) stroke 09/22/2022   Carotid artery thrombosis, left 09/13/2022   Centrilobular emphysema (HCC) 04/24/2022   Polyp of descending colon    Chronic midline low back pain without sciatica 06/19/2021   Chronic  kidney disease, stage 3b (HCC) 04/19/2021   Atrophic kidney 09/27/2020   Aortic aneurysm, abdominal 09/27/2020   Benign essential tremor 04/09/2020   Vitamin D  deficiency 09/06/2019   Generalized anxiety disorder 02/16/2019   Lichen sclerosus et atrophicus of the vulva 11/25/2018   Essential hypertension 11/24/2018   Old lacunar stroke without late effect 11/24/2018   Tobacco abuse, in remission 10/25/2015   Hx of hepatitis C 10/25/2015   CAD in native artery 05/24/2015   Mixed hyperlipidemia 06/12/2011    Allergies  Allergen Reactions   Morphine Hives and Shortness Of Breath    Stopped breathing    Scopolamine Other (See Comments)    Unconscious    Codeine  Nausea Only and Other (See Comments)    NAUSEA/VOMITING    Sulfa Antibiotics Hives and Nausea Only    Other Reaction: Not Assessed     Past Surgical History:  Procedure Laterality Date   CARDIAC ELECTROPHYSIOLOGY STUDY AND ABLATION  2006   for SVT   CATARACT EXTRACTION W/PHACO Right 02/06/2021   Procedure: CATARACT EXTRACTION PHACO AND INTRAOCULAR LENS PLACEMENT (IOC) RIGHT;  Surgeon: Mittie Gaskin, MD;  Location: [facility];  Service: Ophthalmology;  Laterality: Right;  2.85 0:32.6 8.7%   CATARACT EXTRACTION W/PHACO Left 02/20/2021   Procedure: CATARACT EXTRACTION PHACO AND INTRAOCULAR LENS PLACEMENT (IOC) LEFT;  Surgeon: Mittie Gaskin, MD;  Location: [facility];  Service: Ophthalmology;  Laterality: Left;  3.12 0:39.5   COLONOSCOPY  2006   COLONOSCOPY WITH PROPOFOL  N/A 10/25/2021   Procedure: COLONOSCOPY WITH BIOPSY;  Surgeon: Jinny Carmine, MD;  Location: [facility];  Service: Endoscopy;  Laterality: N/A;   CORONARY ANGIOPLASTY WITH STENT PLACEMENT  2001   to Circumfx   ESOPHAGOGASTRODUODENOSCOPY  2017   LAPAROSCOPIC CHOLECYSTECTOMY  04/25/2019   @ [facility]   LAPAROSCOPIC GASTRIC SLEEVE RESECTION  11/07/2015   POLYPECTOMY N/A 10/25/2021   Procedure: POLYPECTOMY;  Surgeon: Jinny Carmine, MD;  Location: [facility];  Service: Endoscopy;  Laterality: N/A;   TOTAL ABDOMINAL HYSTERECTOMY  1992   CIS cervix    Social History   Tobacco Use   Smoking status: Former    Current packs/day: 0.00    Average packs/day: 0.5 packs/day for 44.0 years (22.0 ttl pk-yrs)    Types: Cigarettes    Start date: 66    Quit date: 09/13/2022    Years since quitting: 1.7   Smokeless tobacco: Never  Vaping Use   Vaping status: Never Used  Substance Use Topics   Alcohol use: Not Currently    Comment: rare   Drug use: Never     Medication list has been reviewed and updated.  Current Meds  Medication Sig   aspirin EC 81 MG tablet Take 1 tablet by mouth daily.   cholecalciferol (VITAMIN D3) 25 MCG (1000 UNIT) tablet Take 2,000 Units by mouth daily.   clobetasol  cream (TEMOVATE ) 0.05 % Apply topically 2 (two) times daily.   ipratropium-albuterol  (DUONEB) 0.5-2.5 (3) MG/3ML SOLN Take 3 mLs by nebulization every 4 (four) hours as needed.  Melatonin 10 MG TABS Take 1 tablet by mouth at bedtime.   Multiple Vitamins-Minerals (ALIVE WOMENS ENERGY) TABS Take by mouth.   propranolol (INDERAL) 20 MG tablet Take 20 mg by mouth 2 (two) times daily.   rosuvastatin  (CRESTOR ) 40 MG tablet TAKE 1 TABLET(40 MG) BY MOUTH DAILY   Vitamin D , Ergocalciferol , (DRISDOL ) 1.25 MG (50000 UNIT) CAPS capsule TAKE 1 CAPSULE BY MOUTH EVERY 7 DAYS   [DISCONTINUED] amLODipine  (NORVASC ) 5 MG tablet TAKE 1 TABLET(5 MG) BY MOUTH DAILY   [DISCONTINUED] escitalopram  (LEXAPRO ) 20 MG tablet TAKE 1 TABLET(20 MG) BY MOUTH DAILY   [DISCONTINUED] lisinopril  (ZESTRIL ) 20 MG tablet TAKE 1 TABLET BY MOUTH DAILY       06/08/2024    2:39 PM 03/09/2024    1:42 PM 02/03/2024    2:42 PM 12/23/2023    3:26 PM  GAD 7 : Generalized Anxiety Score  Nervous, Anxious, on Edge 0 0 0 1  Control/stop worrying 0 0 0 0  Worry too much - different things 0 0 0 0  Trouble relaxing 0 0 1 1  Restless 0 0 0 0  Easily annoyed or  irritable 0 0 0 1  Afraid - awful might happen 0 0 0 0  Total GAD 7 Score 0 0 1 3  Anxiety Difficulty Not difficult at all Not difficult at all Somewhat difficult Somewhat difficult       06/08/2024    2:39 PM 05/05/2024    4:26 PM 03/16/2024   10:32 AM  Depression screen PHQ 2/9  Decreased Interest 0 0 0  Down, Depressed, Hopeless 0 0 0  PHQ - 2 Score 0 0 0  Altered sleeping 0    Tired, decreased energy 0    Change in appetite 0    Feeling bad or failure about yourself  0    Trouble concentrating 0    Moving slowly or fidgety/restless 0    Suicidal thoughts 0    PHQ-9 Score 0    Difficult doing work/chores Not difficult at all      BP Readings from Last 3 Encounters:  06/08/24 132/74  05/05/24 (!) 146/88  03/16/24 118/80    Physical Exam Vitals and nursing note reviewed.  Constitutional:      General: She is not in acute distress.    Appearance: She is well-developed.  HENT:     Head: Normocephalic and atraumatic.     Right Ear: Tympanic membrane and ear canal normal.     Left Ear: Tympanic membrane and ear canal normal.     Nose:     Right Sinus: No maxillary sinus tenderness.     Left Sinus: No maxillary sinus tenderness.  Eyes:     General: No scleral icterus.       Right eye: No discharge.        Left eye: No discharge.     Conjunctiva/sclera: Conjunctivae normal.  Neck:     Thyroid : No thyromegaly.     Vascular: No carotid bruit.  Cardiovascular:     Rate and Rhythm: Normal rate and regular rhythm.     Pulses: Normal pulses.     Heart sounds: Normal heart sounds.  Pulmonary:     Effort: Pulmonary effort is normal. No respiratory distress.     Breath sounds: No wheezing.  Abdominal:     General: Bowel sounds are normal.     Palpations: Abdomen is soft.     Tenderness: There is no abdominal tenderness.  Musculoskeletal:  Cervical back: Normal range of motion. No erythema.     Right lower leg: No edema.     Left lower leg: No edema.   Lymphadenopathy:     Cervical: No cervical adenopathy.  Skin:    General: Skin is warm and dry.     Capillary Refill: Capillary refill takes less than 2 seconds.     Findings: No rash.  Neurological:     General: No focal deficit present.     Mental Status: She is alert and oriented to person, place, and time.     Cranial Nerves: No cranial nerve deficit.     Sensory: No sensory deficit.     Deep Tendon Reflexes: Reflexes are normal and symmetric.  Psychiatric:        Attention and Perception: Attention normal.        Mood and Affect: Mood normal.        Behavior: Behavior normal.     Wt Readings from Last 3 Encounters:  06/08/24 155 lb (70.3 kg)  05/05/24 152 lb 3.2 oz (69 kg)  03/16/24 157 lb (71.2 kg)    BP 132/74   Pulse 88   Ht 5' 1 (1.549 m)   Wt 155 lb (70.3 kg)   SpO2 96%   BMI 29.29 kg/m   Assessment and Plan:  Problem List Items Addressed This Visit       Unprioritized   CAD in native artery (Chronic)   No longer on Repatha  due to cost. Continues on Crestor  and ASA      Relevant Medications   propranolol (INDERAL) 20 MG tablet   amLODipine  (NORVASC ) 5 MG tablet   lisinopril  (ZESTRIL ) 20 MG tablet   Essential hypertension (Chronic)   Well controlled blood pressure today. Current regimen is lisinopril  and amlodipine . No medication side effects noted.        Relevant Medications   propranolol (INDERAL) 20 MG tablet   amLODipine  (NORVASC ) 5 MG tablet   lisinopril  (ZESTRIL ) 20 MG tablet   Other Relevant Orders   CBC with Differential/Platelet   Comprehensive metabolic panel with GFR   TSH   Urinalysis, Routine w reflex microscopic   Mixed hyperlipidemia (Chronic)   Currently on Crestor  alone Repatha  stopped due to cost. Lab Results  Component Value Date   LDLCALC 48 12/23/2023         Relevant Medications   propranolol (INDERAL) 20 MG tablet   amLODipine  (NORVASC ) 5 MG tablet   lisinopril  (ZESTRIL ) 20 MG tablet   Other Relevant  Orders   Lipid panel   Generalized anxiety disorder (Chronic)   Relevant Medications   escitalopram  (LEXAPRO ) 20 MG tablet   Benign essential tremor (Chronic)   On propranolol for tremor without much improvement. Continue Neurology follow up      Aortic aneurysm, abdominal (Chronic)   Due for follow up with Abdominal US       Relevant Medications   propranolol (INDERAL) 20 MG tablet   amLODipine  (NORVASC ) 5 MG tablet   lisinopril  (ZESTRIL ) 20 MG tablet   Other Relevant Orders   US  Abdomen Complete   Chronic kidney disease, stage 3b (HCC) (Chronic)   Will continue to monitor every 4-6 months      Cervical spondylosis with radiculopathy   Relevant Medications   cyclobenzaprine  (FLEXERIL ) 10 MG tablet   escitalopram  (LEXAPRO ) 20 MG tablet   Other Visit Diagnoses       Annual physical exam    -  Primary  Encounter for screening mammogram for breast cancer       mammogram overdue patient to schedule.     Screening for diabetes mellitus       Relevant Orders   Hemoglobin A1c     Encounter for immunization       Relevant Orders   Flu vaccine HIGH DOSE PF(Fluzone Trivalent) (Completed)   Pneumococcal conjugate vaccine 20-valent (Completed)       Return for TOC  HTN, Depression  Dr. Lemon.    Leita HILARIO Adie, MD [facility] [place]

## 2024-06-08 NOTE — Assessment & Plan Note (Signed)
 No longer on Repatha  due to cost. Continues on Crestor  and ASA

## 2024-06-08 NOTE — Assessment & Plan Note (Signed)
 Currently on Crestor  alone Repatha  stopped due to cost. Lab Results  Component Value Date   LDLCALC 48 12/23/2023

## 2024-06-08 NOTE — Assessment & Plan Note (Signed)
 Due for follow up with Abdominal US 

## 2024-06-08 NOTE — Assessment & Plan Note (Signed)
 Will continue to monitor every 4-6 months

## 2024-06-08 NOTE — Assessment & Plan Note (Signed)
 On propranolol for tremor without much improvement. Continue Neurology follow up

## 2024-06-09 ENCOUNTER — Ambulatory Visit: Payer: Self-pay | Admitting: Internal Medicine

## 2024-06-09 LAB — LIPID PANEL
Chol/HDL Ratio: 2.2 ratio (ref 0.0–4.4)
Cholesterol, Total: 150 mg/dL (ref 100–199)
HDL: 68 mg/dL (ref >39)
LDL Chol Calc (NIH): 66 mg/dL (ref 0–99)
Triglycerides: 83 mg/dL (ref 0–149)
VLDL Cholesterol Cal: 16 mg/dL (ref 5–40)

## 2024-06-09 LAB — URINALYSIS, ROUTINE W REFLEX MICROSCOPIC
Bilirubin, UA: NEGATIVE
Glucose, UA: NEGATIVE
Ketones, UA: NEGATIVE
Nitrite, UA: POSITIVE — AB
Specific Gravity, UA: 1.018 (ref 1.005–1.030)
Urobilinogen, Ur: 0.2 mg/dL (ref 0.2–1.0)
pH, UA: 5.5 (ref 5.0–7.5)

## 2024-06-09 LAB — COMPREHENSIVE METABOLIC PANEL WITH GFR
ALT: 21 IU/L (ref 0–32)
AST: 29 IU/L (ref 0–40)
Albumin: 4 g/dL (ref 3.9–4.9)
Alkaline Phosphatase: 76 IU/L (ref 49–135)
BUN/Creatinine Ratio: 10 — ABNORMAL LOW (ref 12–28)
BUN: 10 mg/dL (ref 8–27)
Bilirubin Total: <0.2 mg/dL (ref 0.0–1.2)
CO2: 26 mmol/L (ref 20–29)
Calcium: 9.6 mg/dL (ref 8.7–10.3)
Chloride: 104 mmol/L (ref 96–106)
Creatinine, Ser: 1.03 mg/dL — ABNORMAL HIGH (ref 0.57–1.00)
Globulin, Total: 2.6 g/dL (ref 1.5–4.5)
Glucose: 86 mg/dL (ref 70–99)
Potassium: 3.9 mmol/L (ref 3.5–5.2)
Sodium: 143 mmol/L (ref 134–144)
Total Protein: 6.6 g/dL (ref 6.0–8.5)
eGFR: 59 mL/min/1.73 — ABNORMAL LOW (ref >59)

## 2024-06-09 LAB — CBC WITH DIFFERENTIAL/PLATELET
Basophils Absolute: 0.1 x10E3/uL (ref 0.0–0.2)
Basos: 1 %
EOS (ABSOLUTE): 0.5 x10E3/uL — ABNORMAL HIGH (ref 0.0–0.4)
Eos: 5 %
Hematocrit: 45.8 % (ref 34.0–46.6)
Hemoglobin: 15.2 g/dL (ref 11.1–15.9)
Immature Grans (Abs): 0.1 x10E3/uL (ref 0.0–0.1)
Immature Granulocytes: 1 %
Lymphocytes Absolute: 2.7 x10E3/uL (ref 0.7–3.1)
Lymphs: 29 %
MCH: 31.4 pg (ref 26.6–33.0)
MCHC: 33.2 g/dL (ref 31.5–35.7)
MCV: 95 fL (ref 79–97)
Monocytes Absolute: 1.1 x10E3/uL — ABNORMAL HIGH (ref 0.1–0.9)
Monocytes: 12 %
Neutrophils Absolute: 4.8 x10E3/uL (ref 1.4–7.0)
Neutrophils: 52 %
Platelets: 263 x10E3/uL (ref 150–450)
RBC: 4.84 x10E6/uL (ref 3.77–5.28)
RDW: 13.4 % (ref 11.7–15.4)
WBC: 9.3 x10E3/uL (ref 3.4–10.8)

## 2024-06-09 LAB — MICROSCOPIC EXAMINATION
Casts: NONE SEEN /LPF
Epithelial Cells (non renal): >10 /HPF — AB (ref 0–10)
RBC, Urine: NONE SEEN /HPF (ref 0–2)
WBC, UA: >30 /HPF — AB (ref 0–5)

## 2024-06-09 LAB — HEMOGLOBIN A1C
Est. average glucose Bld gHb Est-mCnc: 120 mg/dL
Hgb A1c MFr Bld: 5.8 % — ABNORMAL HIGH (ref 4.8–5.6)

## 2024-06-09 LAB — TSH: TSH: 1.14 u[IU]/mL (ref 0.450–4.500)

## 2024-06-10 ENCOUNTER — Ambulatory Visit

## 2024-06-14 ENCOUNTER — Encounter: Payer: Self-pay | Admitting: Student in an Organized Health Care Education/Training Program

## 2024-06-14 ENCOUNTER — Ambulatory Visit: Admitting: Student in an Organized Health Care Education/Training Program

## 2024-06-14 ENCOUNTER — Ambulatory Visit
Admission: RE | Admit: 2024-06-14 | Discharge: 2024-06-14 | Disposition: A | Discharge status: Home / Self Care | Source: Ambulatory Visit | Attending: Internal Medicine | Admitting: Internal Medicine

## 2024-06-14 VITALS — BP 152/96 | HR 83 | Temp 97.2°F | Resp 16 | Ht 61.0 in | Wt 155.3 lb

## 2024-06-14 DIAGNOSIS — M4802 Spinal stenosis, cervical region: Secondary | ICD-10-CM | POA: Insufficient documentation

## 2024-06-14 DIAGNOSIS — I714 Abdominal aortic aneurysm, without rupture, unspecified: Secondary | ICD-10-CM | POA: Insufficient documentation

## 2024-06-14 DIAGNOSIS — M47812 Spondylosis without myelopathy or radiculopathy, cervical region: Secondary | ICD-10-CM | POA: Diagnosis not present

## 2024-06-14 DIAGNOSIS — M5412 Radiculopathy, cervical region: Secondary | ICD-10-CM

## 2024-06-14 NOTE — Progress Notes (Signed)
 Safety precautions to be maintained throughout the outpatient stay will include: orient to surroundings, keep bed in low position, maintain call bell within reach at all times, provide assistance with transfer out of bed and ambulation.
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
## 2024-06-15 ENCOUNTER — Ambulatory Visit: Payer: Self-pay | Admitting: Internal Medicine

## 2024-06-15 ENCOUNTER — Other Ambulatory Visit: Payer: Self-pay | Admitting: Internal Medicine

## 2024-06-15 DIAGNOSIS — N3000 Acute cystitis without hematuria: Secondary | ICD-10-CM

## 2024-06-15 MED ORDER — NITROFURANTOIN MONOHYD MACRO 100 MG PO CAPS: 100.0000 mg | ORAL_CAPSULE | Freq: Two times a day (BID) | ORAL | 0 refills | Status: AC

## 2024-06-15 NOTE — Telephone Encounter (Signed)
 Please review.  KP

## 2024-06-15 NOTE — Telephone Encounter (Signed)
 FYI Only or Action Required?: Action required by provider: medication request.  Patient was last seen in primary care on 06/08/2024 by Justus Leita DEL, MD.  Called Nurse Triage reporting Urinary Tract Infection.  Symptoms began yesterday.  Symptoms are: gradually worsening.  Triage Disposition: Call PCP Now  Patient/caregiver understands and will follow disposition?: Yes       Copied from CRM [id number]. Topic: Clinical - Red Word Triage >> Jun 15, 2024  9:53 AM Larissa RAMAN wrote: [facility] that prompted transfer to Nurse Triage: lower abdominal pain, pain with urination, frequent urination. Reason for Disposition  [1] Prescription refill request for ESSENTIAL medicine (i.e., likelihood of harm to patient if not taken) AND [2] triager unable to refill per department policy  Answer Assessment - Initial Assessment Questions Pt states she was seen in office on 10/8 and had a urinalysis. Pt states it did show an infection. Pt was told to call back if she has symptoms and she would be started on an antibiotic.This RN educated pt on new-worsening symptoms and when to call back. Pt verbalized understanding and agrees to plan.   [id number] [place],  - [address] [place] [address] AT [facility] OF [address], [place] [address] Phone: [phone]  Fax: [phone]  SYMPTOM: What's the main symptom you're concerned about? (e.g., frequency, incontinence)     Lower abdominal pain and lower back pain, frequent urination, pain with urination, odor to urine, urinary spasms  ONSET: When did the symptoms  start?     Yesterday afternoon  PAIN: Is there any pain? If Yes, ask: How bad is it? (Scale: 1-10; mild, moderate, severe)     5/10 pain  CAUSE: What do you think is causing the symptoms?     Usually it is e. Coli per pt  Denies blood in urine, fever  Protocols used: Urinary Symptoms-A-AH, Medication Refill and Renewal Call-A-AH

## 2024-06-15 NOTE — Progress Notes (Unsigned)
 Date:  06/15/2024   Name:  Tiffany Bonilla   DOB:  03-May-1956   MRN:  [id number]   Chief Complaint: No chief complaint on file.  HPI  Review of Systems   Lab Results  Component Value Date   NA 143 06/08/2024   K 3.9 06/08/2024   CO2 26 06/08/2024   GLUCOSE 86 06/08/2024   BUN 10 06/08/2024   CREATININE 1.03 (H) 06/08/2024   CALCIUM  9.6 06/08/2024   EGFR 59 (L) 06/08/2024   GFRNONAA 50 (L) 04/09/2020   Lab Results  Component Value Date   CHOL 150 06/08/2024   HDL 68 06/08/2024   LDLCALC 66 06/08/2024   TRIG 83 06/08/2024   CHOLHDL 2.2 06/08/2024   Lab Results  Component Value Date   TSH 1.140 06/08/2024   Lab Results  Component Value Date   HGBA1C 5.8 (H) 06/08/2024   Lab Results  Component Value Date   WBC 9.3 06/08/2024   HGB 15.2 06/08/2024   HCT 45.8 06/08/2024   MCV 95 06/08/2024   PLT 263 06/08/2024   Lab Results  Component Value Date   ALT 21 06/08/2024   AST 29 06/08/2024   ALKPHOS 76 06/08/2024   BILITOT <0.2 06/08/2024   Lab Results  Component Value Date   VD25OH 36.1 12/23/2023     Patient Active Problem List   Diagnosis Date Noted   Cervical spondylosis with radiculopathy 05/05/2024   Rotator cuff impingement syndrome of right shoulder 03/16/2024   Cervical paraspinal muscle spasm 03/16/2024   Subacute cough 08/18/2023   Chronic ischemic left middle cerebral artery (MCA) stroke 09/22/2022   Carotid artery thrombosis, left 09/13/2022   Centrilobular emphysema (HCC) 04/24/2022   Polyp of descending colon    Chronic midline low back pain without sciatica 06/19/2021   Chronic kidney disease, stage 3b (HCC) 04/19/2021   Atrophic kidney 09/27/2020   Aortic aneurysm, abdominal 09/27/2020   Benign essential tremor 04/09/2020   Vitamin D  deficiency 09/06/2019   Generalized anxiety disorder 02/16/2019   Lichen sclerosus et atrophicus of the vulva 11/25/2018   Essential hypertension 11/24/2018   Old lacunar stroke without late  effect 11/24/2018   Tobacco abuse, in remission 10/25/2015   Hx of hepatitis C 10/25/2015   CAD in native artery 05/24/2015   Mixed hyperlipidemia 06/12/2011    Allergies  Allergen Reactions   Morphine Hives and Shortness Of Breath    Stopped breathing    Scopolamine Other (See Comments)    Unconscious    Codeine  Nausea Only and Other (See Comments)    NAUSEA/VOMITING    Sulfa Antibiotics Hives and Nausea Only    Other Reaction: Not Assessed     Past Surgical History:  Procedure Laterality Date   CARDIAC ELECTROPHYSIOLOGY STUDY AND ABLATION  2006   for SVT   CATARACT EXTRACTION W/PHACO Right 02/06/2021   Procedure: CATARACT EXTRACTION PHACO AND INTRAOCULAR LENS PLACEMENT (IOC) RIGHT;  Surgeon: Mittie Gaskin, MD;  Location: [facility];  Service: Ophthalmology;  Laterality: Right;  2.85 0:32.6 8.7%   CATARACT EXTRACTION W/PHACO Left 02/20/2021   Procedure: CATARACT EXTRACTION PHACO AND INTRAOCULAR LENS PLACEMENT (IOC) LEFT;  Surgeon: Mittie Gaskin, MD;  Location: [facility];  Service: Ophthalmology;  Laterality: Left;  3.12 0:39.5   COLONOSCOPY  2006   COLONOSCOPY WITH PROPOFOL  N/A 10/25/2021   Procedure: COLONOSCOPY WITH BIOPSY;  Surgeon: Jinny Carmine, MD;  Location: [facility];  Service: Endoscopy;  Laterality: N/A;   CORONARY ANGIOPLASTY WITH  STENT PLACEMENT  2001   to Circumfx   ESOPHAGOGASTRODUODENOSCOPY  2017   LAPAROSCOPIC CHOLECYSTECTOMY  04/25/2019   @ [facility]   LAPAROSCOPIC GASTRIC SLEEVE RESECTION  11/07/2015   POLYPECTOMY N/A 10/25/2021   Procedure: POLYPECTOMY;  Surgeon: Jinny Carmine, MD;  Location: [facility];  Service: Endoscopy;  Laterality: N/A;   TOTAL ABDOMINAL HYSTERECTOMY  1992   CIS cervix    Social History   Tobacco Use   Smoking status: Former    Current packs/day: 0.00    Average packs/day: 0.5 packs/day for 44.0 years (22.0 ttl pk-yrs)    Types: Cigarettes    Start date: 18    Quit date:  09/13/2022    Years since quitting: 1.7   Smokeless tobacco: Never  Vaping Use   Vaping status: Never Used  Substance Use Topics   Alcohol use: Not Currently    Comment: rare   Drug use: Never     Medication list has been reviewed and updated.  No outpatient medications have been marked as taking for the 06/15/24 encounter (Orders Only) with Justus Leita DEL, MD.       06/08/2024    2:39 PM 03/09/2024    1:42 PM 02/03/2024    2:42 PM 12/23/2023    3:26 PM  GAD 7 : Generalized Anxiety Score  Nervous, Anxious, on Edge 0 0 0 1  Control/stop worrying 0 0 0 0  Worry too much - different things 0 0 0 0  Trouble relaxing 0 0 1 1  Restless 0 0 0 0  Easily annoyed or irritable 0 0 0 1  Afraid - awful might happen 0 0 0 0  Total GAD 7 Score 0 0 1 3  Anxiety Difficulty Not difficult at all Not difficult at all Somewhat difficult Somewhat difficult       06/14/2024    8:41 AM 06/08/2024    2:39 PM 05/05/2024    4:26 PM  Depression screen PHQ 2/9  Decreased Interest 0 0 0  Down, Depressed, Hopeless 0 0 0  PHQ - 2 Score 0 0 0  Altered sleeping  0   Tired, decreased energy  0   Change in appetite  0   Feeling bad or failure about yourself   0   Trouble concentrating  0   Moving slowly or fidgety/restless  0   Suicidal thoughts  0   PHQ-9 Score  0   Difficult doing work/chores  Not difficult at all     BP Readings from Last 3 Encounters:  06/14/24 (!) 152/96  06/08/24 132/74  05/05/24 (!) 146/88    Physical Exam  Wt Readings from Last 3 Encounters:  06/14/24 155 lb 4.8 oz (70.4 kg)  06/08/24 155 lb (70.3 kg)  05/05/24 152 lb 3.2 oz (69 kg)    There were no vitals taken for this visit.  Assessment and Plan:  Problem List Items Addressed This Visit   None   No follow-ups on file.    Leita HILARIO Justus, MD [facility] [place]

## 2024-06-29 ENCOUNTER — Ambulatory Visit
Admission: RE | Admit: 2024-06-29 | Discharge: 2024-06-29 | Disposition: A | Discharge status: Home / Self Care | Source: Ambulatory Visit | Attending: Student in an Organized Health Care Education/Training Program | Admitting: Student in an Organized Health Care Education/Training Program

## 2024-06-29 ENCOUNTER — Ambulatory Visit (HOSPITAL_BASED_OUTPATIENT_CLINIC_OR_DEPARTMENT_OTHER): Admitting: Student in an Organized Health Care Education/Training Program

## 2024-06-29 ENCOUNTER — Encounter: Payer: Self-pay | Admitting: Student in an Organized Health Care Education/Training Program

## 2024-06-29 DIAGNOSIS — M5412 Radiculopathy, cervical region: Secondary | ICD-10-CM | POA: Insufficient documentation

## 2024-06-29 DIAGNOSIS — M4802 Spinal stenosis, cervical region: Secondary | ICD-10-CM

## 2024-06-29 MED ORDER — ROPIVACAINE HCL 2 MG/ML IJ SOLN
1.0000 mL | Freq: Once | INTRAMUSCULAR | Status: AC
  Administered 2024-06-29: 1 mL via EPIDURAL
  Filled 2024-06-29: qty 20

## 2024-06-29 MED ORDER — SODIUM CHLORIDE 0.9% FLUSH
1.0000 mL | Freq: Once | INTRAVENOUS | Status: AC
  Administered 2024-06-29: 1 mL

## 2024-06-29 MED ORDER — LIDOCAINE HCL 2 % IJ SOLN
20.0000 mL | Freq: Once | INTRAMUSCULAR | Status: AC
  Administered 2024-06-29: 100 mg
  Filled 2024-06-29: qty 40

## 2024-06-29 MED ORDER — DEXAMETHASONE SOD PHOSPHATE PF 10 MG/ML IJ SOLN
10.0000 mg | Freq: Once | INTRAMUSCULAR | Status: AC
  Administered 2024-06-29: 10 mg

## 2024-06-29 MED ORDER — DIAZEPAM 5 MG PO TABS
ORAL_TABLET | ORAL | Status: AC
  Filled 2024-06-29: qty 1

## 2024-06-29 MED ORDER — IOHEXOL 180 MG/ML  SOLN
10.0000 mL | Freq: Once | INTRAMUSCULAR | Status: AC
  Administered 2024-06-29: 10 mL via EPIDURAL
  Filled 2024-06-29: qty 20

## 2024-06-29 NOTE — Patient Instructions (Signed)

## 2024-06-29 NOTE — Progress Notes (Signed)
 PROVIDER NOTE: Interpretation of information contained herein should be left to medically-trained personnel. Specific patient instructions are provided elsewhere under Patient Instructions section of medical record. This document was created in part using STT-dictation technology, any transcriptional errors that may result from this process are unintentional.  Patient: Tiffany Bonilla Type: Established DOB: 05/22/1956 MRN: [id number] PCP: Justus Leita DEL, MD  Service: Procedure DOS: 06/29/2024 Setting: Ambulatory Location: Ambulatory outpatient facility Delivery: Face-to-face Provider: Wallie Sherry, MD Specialty: Interventional Pain Management Specialty designation: 09 Location: Outpatient facility Ref. Prov.: Sherry Wallie, MD       Interventional Therapy   Type: Cervical Epidural Steroid injection (CESI) (Interlaminar) #1  Laterality: Right (-RT)  Level: C7-T1 DOS: 06/29/2024  Provider: Wallie Sherry, MD Imaging: Fluoroscopy-guided Spinal ([id number]) Anesthesia: Local anesthesia (1-2% Lidocaine ) Sedation: Minimal Sedation                        Medical Necessity Purpose: Diagnostic/Therapeutic Rationale (medical necessity): procedure needed and proper for the diagnosis and/or treatment of Tiffany Bonilla's medical symptoms and needs. Indications: Cervicalgia, cervical radicular pain, degenerative disc disease, severe enough to impact quality of life or function. 1. Cervical radicular pain   2. Foraminal stenosis of cervical region    NAS-11 Pain score:   Pre-procedure: 5 /10   Post-procedure: 3/10     Position  Prep  Materials:  Location setting: Procedure suite Position: Prone, on modified reverse trendelenburg to facilitate breathing, with head in head-cradle. Pillows positioned under chest (below chin-level) with cervical spine flexed. Safety Precautions: Patient was assessed for positional comfort and pressure points before starting the procedure. Prepping  solution: DuraPrep (Iodine Povacrylex [0.7% available iodine] and Isopropyl Alcohol, 74% w/w) Prep Area: Entire  cervicothoracic region Approach: percutaneous, paramedial Intended target: Posterior cervical epidural space Materials Procedure:  Tray: Epidural Needle(s): Epidural (Tuohy) Qty: 1 Length: (90mm) 3.5-inch Gauge: 22G  H&P (Pre-op Assessment):  Tiffany Bonilla is a 68 y.o. (year old), female patient, seen today for interventional treatment. She  has a past surgical history that includes Laparoscopic gastric sleeve resection (11/07/2015); Cardiac electrophysiology study and ablation (2006); Coronary angioplasty with stent (2001); Esophagogastroduodenoscopy (2017); Colonoscopy (2006); Total abdominal hysterectomy (1992); Laparoscopic cholecystectomy (04/25/2019); Cataract extraction w/PHACO (Right, 02/06/2021); Cataract extraction w/PHACO (Left, 02/20/2021); Colonoscopy with propofol  (N/A, 10/25/2021); and polypectomy (N/A, 10/25/2021). Tiffany Bonilla has a current medication list which includes the following prescription(s): amlodipine , aspirin ec, cholecalciferol, clobetasol  cream, cyclobenzaprine , escitalopram , lisinopril , melatonin, alive womens energy, propranolol, rosuvastatin , vitamin d  (ergocalciferol ), and ipratropium-albuterol . Her primarily concern today is the Neck Pain  Initial Vital Signs:  Pulse/HCG Rate: 88ECG Heart Rate: 68 Temp: (!) 97.4 F (36.3 C) Resp: 16 BP: 118/81 SpO2: 96 %  BMI: Estimated body mass index is 28.72 kg/m as calculated from the following:   Height as of this encounter: 5' 1 (1.549 m).   Weight as of this encounter: 152 lb (68.9 kg).  Risk Assessment: Allergies: Reviewed. She is allergic to morphine, scopolamine, codeine , and sulfa antibiotics.  Allergy Precautions: None required Coagulopathies: Reviewed. None identified.  Blood-thinner therapy: None at this time Active Infection(s): Reviewed. None identified. Tiffany Bonilla is afebrile  Site  Confirmation: Tiffany Bonilla was asked to confirm the procedure and laterality before marking the site Procedure checklist: Completed Consent: Before the procedure and under the influence of no sedative(s), amnesic(s), or anxiolytics, the patient was informed of the treatment options, risks and possible complications. To fulfill our ethical and legal obligations, as recommended by the American Medical Association's  Code of Ethics, I have informed the patient of my clinical impression; the nature and purpose of the treatment or procedure; the risks, benefits, and possible complications of the intervention; the alternatives, including doing nothing; the risk(s) and benefit(s) of the alternative treatment(s) or procedure(s); and the risk(s) and benefit(s) of doing nothing. The patient was provided information about the general risks and possible complications associated with the procedure. These may include, but are not limited to: failure to achieve desired goals, infection, bleeding, organ or nerve damage, allergic reactions, paralysis, and death. In addition, the patient was informed of those risks and complications associated to Spine-related procedures, such as failure to decrease pain; infection (i.e.: Meningitis, epidural or intraspinal abscess); bleeding (i.e.: epidural hematoma, subarachnoid hemorrhage, or any other type of intraspinal or peri-dural bleeding); organ or nerve damage (i.e.: Any type of peripheral nerve, nerve root, or spinal cord injury) with subsequent damage to sensory, motor, and/or autonomic systems, resulting in permanent pain, numbness, and/or weakness of one or several areas of the body; allergic reactions; (i.e.: anaphylactic reaction); and/or death. Furthermore, the patient was informed of those risks and complications associated with the medications. These include, but are not limited to: allergic reactions (i.e.: anaphylactic or anaphylactoid reaction(s)); adrenal axis suppression;  blood sugar elevation that in diabetics may result in ketoacidosis or comma; water  retention that in patients with history of congestive heart failure may result in shortness of breath, pulmonary edema, and decompensation with resultant heart failure; weight gain; swelling or edema; medication-induced neural toxicity; particulate matter embolism and blood vessel occlusion with resultant organ, and/or nervous system infarction; and/or aseptic necrosis of one or more joints. Finally, the patient was informed that Medicine is not an exact science; therefore, there is also the possibility of unforeseen or unpredictable risks and/or possible complications that may result in a catastrophic outcome. The patient indicated having understood very clearly. We have given the patient no guarantees and we have made no promises. Enough time was given to the patient to ask questions, all of which were answered to the patient's satisfaction. Tiffany Bonilla has indicated that she wanted to continue with the procedure. Attestation: I, the ordering provider, attest that I have discussed with the patient the benefits, risks, side-effects, alternatives, likelihood of achieving goals, and potential problems during recovery for the procedure that I have provided informed consent. Date  Time: 06/29/2024  1:19 PM  Pre-Procedure Preparation:  Monitoring: As per clinic protocol. Respiration, ETCO2, SpO2, BP, heart rate and rhythm monitor placed and checked for adequate function Safety Precautions: Patient was assessed for positional comfort and pressure points before starting the procedure. Time-out: I initiated and conducted the Time-out before starting the procedure, as per protocol. The patient was asked to participate by confirming the accuracy of the Time Out information. Verification of the correct person, site, and procedure were performed and confirmed by me, the nursing staff, and the patient. Time-out conducted as per  Joint Commission's Universal Protocol (UP.01.01.01). Time: 1431 Start Time: 1431 hrs.  Description  Narrative of Procedure:          Rationale (medical necessity): procedure needed and proper for the diagnosis and/or treatment of the patient's medical symptoms and needs. Start Time: 1431 hrs. Safety Precautions: Aspiration looking for blood return was conducted prior to all injections. At no point did we inject any substances, as a needle was being advanced. No attempts were made at seeking any paresthesias. Safe injection practices and needle disposal techniques used. Medications properly checked for expiration dates.  SDV (single dose vial) medications used. Description of procedure: Protocol guidelines were followed. The patient was assisted into a comfortable position. The target area was identified and the area prepped in the usual manner. Skin & deeper tissues infiltrated with local anesthetic. Appropriate amount of time allowed to pass for local anesthetics to take effect. Using fluoroscopic guidance, the epidural needle was introduced through the skin, ipsilateral to the reported pain, and advanced to the target area. Posterior laminar os was contacted and the needle walked caudad, until the lamina was cleared. The ligamentum flavum was engaged and the epidural space identified using "loss-of-resistance technique" with 2-3 ml of PF-NaCl (0.9% NSS), in a 5cc dedicated LOR syringe. (See Imaging guidance below for use of contrast details.) Once proper needle placement was secured, and negative aspiration confirmed, the solution was injected in intermittent fashion, asking for systemic symptoms every 0.5cc. The needles were then removed and the area cleansed, making sure to leave some of the prepping solution back to take advantage of its long term bactericidal properties.  Vitals:   06/29/24 1344 06/29/24 1430 06/29/24 1435  BP: 118/81 117/67 120/83  Pulse: 88    Resp: 16 18 18   Temp: (!) 97.4  F (36.3 C)    TempSrc: Temporal    SpO2: 96% 97% 97%  Weight: 152 lb (68.9 kg)    Height: 5' 1 (1.549 m)       End Time: 1435 hrs.  Imaging Guidance (Spinal):          Type of Imaging Technique: Fluoroscopy Guidance (Spinal) Indication(s): Fluoroscopy guidance for needle placement to enhance accuracy in procedures requiring precise needle localization for targeted delivery of medication in or near specific anatomical locations not easily accessible without such real-time imaging assistance. Exposure Time: Please see nurses notes. Contrast: Before injecting any contrast, we confirmed that the patient did not have an allergy to iodine, shellfish, or radiological contrast. Once satisfactory needle placement was completed at the desired level, radiological contrast was injected. Contrast injected under live fluoroscopy. No contrast complications. See chart for type and volume of contrast used. Fluoroscopic Guidance: I was personally present during the use of fluoroscopy. Tunnel Vision Technique used to obtain the best possible view of the target area. Parallax error corrected before commencing the procedure. Direction-depth-direction technique used to introduce the needle under continuous pulsed fluoroscopy. Once target was reached, antero-posterior, oblique, and lateral fluoroscopic projection used confirm needle placement in all planes. Images permanently stored in EMR. Interpretation: I personally interpreted the imaging intraoperatively. Adequate needle placement confirmed in multiple planes. Appropriate spread of contrast into desired area was observed. No evidence of afferent or efferent intravascular uptake. No intrathecal or subarachnoid spread observed. Permanent images saved into the patient's record.  Post-operative Assessment:  Post-procedure Vital Signs:  Pulse/HCG Rate: 8873 Temp: (!) 97.4 F (36.3 C) Resp: 18 BP: 120/83 SpO2: 97 %  EBL: None  Complications: No immediate  post-treatment complications observed by team, or reported by patient.  Note: The patient tolerated the entire procedure well. A repeat set of vitals were taken after the procedure and the patient was kept under observation following institutional policy, for this type of procedure. Post-procedural neurological assessment was performed, showing return to baseline, prior to discharge. The patient was provided with post-procedure discharge instructions, including a section on how to identify potential problems. Should any problems arise concerning this procedure, the patient was given instructions to immediately contact us , at any time, without hesitation. In any case, we plan to contact  the patient by telephone for a follow-up status report regarding this interventional procedure.  Comments:  No additional relevant information.  Plan of Care (POC)  5 out of 5 strength bilateral upper extremity: Shoulder abduction, elbow flexion, elbow extension, thumb extension.   Orders:  Orders Placed This Encounter  Procedures   DG PAIN CLINIC C-ARM 1-60 MIN NO REPORT    Intraoperative interpretation by procedural physician at [facility].    Standing Status:   Standing    Number of Occurrences:   1    Reason for exam::   Assistance in needle guidance and placement for procedures requiring needle placement in or near specific anatomical locations not easily accessible without such assistance.     Medications ordered for procedure: Meds ordered this encounter  Medications   iohexol (OMNIPAQUE) 180 MG/ML injection 10 mL    Must be Myelogram-compatible. If not available, you may substitute with a water -soluble, non-ionic, hypoallergenic, myelogram-compatible radiological contrast medium.   lidocaine  (XYLOCAINE ) 2 % (with pres) injection 400 mg   sodium chloride  flush (NS) 0.9 % injection 1 mL   ropivacaine (PF) 2 mg/mL (0.2%) (NAROPIN) injection 1 mL   dexamethasone (DECADRON) injection 10 mg    Medications administered: We administered iohexol, lidocaine , sodium chloride  flush, ropivacaine (PF) 2 mg/mL (0.2%), and dexamethasone.  See the medical record for exact dosing, route, and time of administration.   Follow-up plan:   Return in about 19 days (around 07/18/2024) for PPE, F2F.     Recent Visits Date Type Provider Dept  06/14/24 Office Visit Marcelino Nurse, MD [facility]  Showing recent visits within past 90 days and meeting all other requirements Today's Visits Date Type Provider Dept  06/29/24 Procedure visit Marcelino Nurse, MD [facility]  Showing today's visits and meeting all other requirements Future Appointments Date Type Provider Dept  07/19/24 Appointment Marcelino Nurse, MD [facility]  Showing future appointments within next 90 days and meeting all other requirements   Disposition: Discharge home  Discharge (Date  Time): 06/29/2024; 1445 hrs.   Primary Care Physician: Justus Leita DEL, MD Location: [facility] Note by: Nurse Marcelino, MD (TTS technology used. I apologize for any typographical errors that were not detected and corrected.) Date: 06/29/2024; Time: 3:39 PM  Disclaimer:  Medicine is not an visual merchandiser. The only guarantee in medicine is that nothing is guaranteed. It is important to note that the decision to proceed with this intervention was based on the information collected from the patient. The Data and conclusions were drawn from the patient's questionnaire, the interview, and the physical examination. Because the information was provided in large part by the patient, it cannot be guaranteed that it has not been purposely or unconsciously manipulated. Every effort has been made to obtain as much relevant data as possible for this evaluation. It is important to note that the conclusions that lead to this procedure are derived in large part from the available data. Always take into  account that the treatment will also be dependent on availability of resources and existing treatment guidelines, considered by other Pain Management Practitioners as being common knowledge and practice, at the time of the intervention. For Medico-Legal purposes, it is also important to point out that variation in procedural techniques and pharmacological choices are the acceptable norm. The indications, contraindications, technique, and results of the above procedure should only be interpreted and judged by a Board-Certified Interventional Pain Specialist with extensive familiarity and expertise in  the same exact procedure and technique.

## 2024-06-30 ENCOUNTER — Telehealth: Payer: Self-pay | Admitting: *Deleted

## 2024-06-30 ENCOUNTER — Other Ambulatory Visit: Payer: Self-pay | Admitting: Medical Genetics

## 2024-06-30 DIAGNOSIS — Z006 Encounter for examination for normal comparison and control in clinical research program: Secondary | ICD-10-CM

## 2024-06-30 NOTE — Telephone Encounter (Signed)
 Post procedure call; no questions or concerns.  Pain relief is great.

## 2024-07-08 NOTE — Progress Notes (Signed)
 Tiffany Bonilla                                          MRN: [id number]   07/08/2024   The [facility] Quality Team Specialist reviewed this patient medical record for the purposes of chart review for care gap closure. The following were reviewed: abstraction for care gap closure-glycemic status assessment.    [facility]BCI Quality Team

## 2024-07-18 ENCOUNTER — Telehealth: Payer: Self-pay | Admitting: Internal Medicine

## 2024-07-18 NOTE — Telephone Encounter (Signed)
 Copied from CRM #[id number]. Topic: Medicare AWV >> Jul 18, 2024  9:48 AM Nathanel DEL wrote: Called LVM 07/18/2024 to sched AWV. Please schedule in office or virtual visit.   Nathanel Paschal; Care Guide Ambulatory Clinical Support [place] [facility] Direct Dial: [phone]

## 2024-07-19 ENCOUNTER — Encounter: Payer: Self-pay | Admitting: Student in an Organized Health Care Education/Training Program

## 2024-07-19 ENCOUNTER — Ambulatory Visit
Attending: Student in an Organized Health Care Education/Training Program | Admitting: Student in an Organized Health Care Education/Training Program

## 2024-07-19 VITALS — BP 142/82 | HR 67 | Temp 97.3°F | Resp 18 | Ht 61.0 in | Wt 155.0 lb

## 2024-07-19 DIAGNOSIS — M47812 Spondylosis without myelopathy or radiculopathy, cervical region: Secondary | ICD-10-CM | POA: Diagnosis not present

## 2024-07-19 DIAGNOSIS — M4802 Spinal stenosis, cervical region: Secondary | ICD-10-CM | POA: Insufficient documentation

## 2024-07-19 DIAGNOSIS — M5412 Radiculopathy, cervical region: Secondary | ICD-10-CM | POA: Diagnosis not present

## 2024-07-19 NOTE — Progress Notes (Signed)
 Safety precautions to be maintained throughout the outpatient stay will include: orient to surroundings, keep bed in low position, maintain call bell within reach at all times, provide assistance with transfer out of bed and ambulation.

## 2024-07-19 NOTE — Progress Notes (Signed)
 PROVIDER NOTE: Interpretation of information contained herein should be left to medically-trained personnel. Specific patient instructions are provided elsewhere under Patient Instructions section of medical record. This document was created in part using AI and STT-dictation technology, any transcriptional errors that may result from this process are unintentional.  Patient: Tiffany Bonilla  Service: E/M   PCP: Justus Leita DEL, MD  DOB: Aug 25, 1956  DOS: 07/19/2024  Provider: Wallie Sherry, MD  MRN: [id number]  Delivery: Face-to-face  Specialty: Interventional Pain Management  Type: Established Patient  Setting: Ambulatory outpatient facility  Specialty designation: 09  Referring Prov.: Justus Leita DEL, MD  Location: Outpatient office facility       History of present illness (HPI) Tiffany Bonilla, a 68 y.o. year old female, is here today because of her Cervical radicular pain [M54.12]. Tiffany Bonilla primary complain today is Shoulder Pain (Right shoulder, right arm )  Pain Assessment: Severity of Chronic pain is reported as a 4 /10. Location: Shoulder Right/Radiates down from right side of neck to right arm and into fingers. Onset: More than a month ago. Quality: Dull, Aching, Sore. Timing: Constant. Modifying factor(s): Procedure, Muscle Relaxer, Ibuprofen. Vitals:  height is 5' 1 (1.549 m) and weight is 155 lb (70.3 kg). Her temporal temperature is 97.3 F (36.3 C) (abnormal). Her blood pressure is 142/82 (abnormal) and her pulse is 67. Her respiration is 18 and oxygen saturation is 98%.  BMI: Estimated body mass index is 29.29 kg/m as calculated from the following:   Height as of this encounter: 5' 1 (1.549 m).   Weight as of this encounter: 155 lb (70.3 kg).  Last encounter: 06/14/2024. Last procedure: 06/29/2024.  Reason for encounter:   Post-Procedure Evaluation   Type: Cervical Epidural Steroid injection (CESI) (Interlaminar) #1  Laterality: Right (-RT)   Level: C7-T1 DOS: 06/29/2024  Provider: Wallie Sherry, MD Imaging: Fluoroscopy-guided Spinal ([id number]) Anesthesia: Local anesthesia (1-2% Lidocaine ) Sedation: Minimal Sedation                        Medical Necessity Purpose: Diagnostic/Therapeutic Rationale (medical necessity): procedure needed and proper for the diagnosis and/or treatment of Tiffany Bonilla's medical symptoms and needs. Indications: Cervicalgia, cervical radicular pain, degenerative disc disease, severe enough to impact quality of life or function. 1. Cervical radicular pain   2. Foraminal stenosis of cervical region    NAS-11 Pain score:   Pre-procedure: 5 /10   Post-procedure: 3/10     Effectiveness:  Initial hour after procedure: 100 %  Subsequent 4-6 hours post-procedure: 100 %  Analgesia past initial 6 hours: 85 %  Ongoing improvement:  Analgesic:  85% Function: Somewhat improved ROM: Tiffany Bonilla reports improvement in ROM   ROS  Constitutional: Denies any fever or chills Gastrointestinal: No reported hemesis, hematochezia, vomiting, or acute GI distress Musculoskeletal: improved neck pain Neurological: No reported episodes of acute onset apraxia, aphasia, dysarthria, agnosia, amnesia, paralysis, loss of coordination, or loss of consciousness  Medication Review  [employer], Melatonin, Vitamin D  (Ergocalciferol ), amLODipine , aspirin EC, cholecalciferol, clobetasol  cream, cyclobenzaprine , escitalopram , ipratropium-albuterol , lisinopril , propranolol, and rosuvastatin   History Review  Allergy: Tiffany Bonilla is allergic to morphine, scopolamine, codeine , and sulfa antibiotics. Drug: Tiffany Bonilla  reports no history of drug use. Alcohol:  reports that she does not currently use alcohol. Tobacco:  reports that she quit smoking about 22 months ago. Her smoking use included cigarettes. She started smoking about 45 years ago. She has a 22 pack-year smoking history.  She has never used smokeless  tobacco. Social: Tiffany Bonilla  reports that she quit smoking about 22 months ago. Her smoking use included cigarettes. She started smoking about 45 years ago. She has a 22 pack-year smoking history. She has never used smokeless tobacco. She reports that she does not currently use alcohol. She reports that she does not use drugs. Medical:  has a past medical history of Allergy, Asthma, COVID (07/07/2022), Hyperlipidemia, Hypertension, Pre-diabetes (11/24/2018), Spina bifida occulta, Stroke (HCC), SVT (supraventricular tachycardia) (11/24/2018), and Wears dentures. Surgical: Tiffany Bonilla  has a past surgical history that includes Laparoscopic gastric sleeve resection (11/07/2015); Cardiac electrophysiology study and ablation (2006); Coronary angioplasty with stent (2001); Esophagogastroduodenoscopy (2017); Colonoscopy (2006); Total abdominal hysterectomy (1992); Laparoscopic cholecystectomy (04/25/2019); Cataract extraction w/PHACO (Right, 02/06/2021); Cataract extraction w/PHACO (Left, 02/20/2021); Colonoscopy with propofol  (N/A, 10/25/2021); and polypectomy (N/A, 10/25/2021). Family: family history includes Aneurysm in her mother; Breast cancer in her maternal aunt.  Laboratory Chemistry Profile   Renal Lab Results  Component Value Date   BUN 10 06/08/2024   CREATININE 1.03 (H) 06/08/2024   BCR 10 (L) 06/08/2024   GFRAA 57 (L) 04/09/2020   GFRNONAA 50 (L) 04/09/2020    Hepatic Lab Results  Component Value Date   AST 29 06/08/2024   ALT 21 06/08/2024   ALBUMIN 4.0 06/08/2024   ALKPHOS 76 06/08/2024    Electrolytes Lab Results  Component Value Date   NA 143 06/08/2024   K 3.9 06/08/2024   CL 104 06/08/2024   CALCIUM  9.6 06/08/2024    Bone Lab Results  Component Value Date   VD25OH 36.1 12/23/2023    Inflammation (CRP: Acute Phase) (ESR: Chronic Phase) No results found for: CRP, ESRSEDRATE, LATICACIDVEN       Note: Above Lab results reviewed.  Recent Imaging Review  DG PAIN  CLINIC C-ARM 1-60 MIN NO REPORT Fluoro was used, but no Radiologist interpretation will be provided.  Please refer to NOTES tab for provider progress note. Note: Reviewed        Physical Exam  Vitals: BP (!) 142/82 (BP Location: Left Arm, Patient Position: Sitting, Cuff Size: Normal)   Pulse 67   Temp (!) 97.3 F (36.3 C) (Temporal)   Resp 18   Ht 5' 1 (1.549 m)   Wt 155 lb (70.3 kg)   SpO2 98%   BMI 29.29 kg/m  BMI: Estimated body mass index is 29.29 kg/m as calculated from the following:   Height as of this encounter: 5' 1 (1.549 m).   Weight as of this encounter: 155 lb (70.3 kg). Ideal: Ideal body weight: 47.8 kg (105 lb 6.1 oz) Adjusted ideal body weight: 56.8 kg (125 lb 3.7 oz) General appearance: Well nourished, well developed, and well hydrated. In no apparent acute distress Mental status: Alert, oriented x 3 (person, place, & time)       Respiratory: No evidence of acute respiratory distress Eyes: PERLA  Improvement of neck pain and radiating arm pain Assessment   Diagnosis Status  1. Cervical radicular pain   2. Foraminal stenosis of cervical region   3. Cervical spondylosis    Controlled Controlled Controlled   Updated Problems: No problems updated.  Plan of Care  Excellent response to C-ESI, continue to monitor sx, repeat PRN Orders:  Orders Placed This Encounter  Procedures   Cervical Epidural Injection    Sedation: Patient's choice. Purpose: Diagnostic/Therapeutic Indication(s): Radiculitis and cervicalgia associater with cervical degenerative disc disease.    Standing Status:   Future  Expiration Date:   10/19/2024    Scheduling Instructions:     Procedure: Cervical Epidural Steroid Injection/Block     Level(s): C7-T1     Laterality: TBD     Timeframe: PRN    Where will this procedure be performed?:   [facility]             Icela Glymph    Return if symptoms worsen or fail to improve.    Recent Visits Date Type Provider Dept   06/29/24 Procedure visit Marcelino Nurse, MD [facility]  06/14/24 Office Visit Marcelino Nurse, MD [facility]  Showing recent visits within past 90 days and meeting all other requirements Today's Visits Date Type Provider Dept  07/19/24 Office Visit Marcelino Nurse, MD [facility]  Showing today's visits and meeting all other requirements Future Appointments No visits were found meeting these conditions. Showing future appointments within next 90 days and meeting all other requirements  I discussed the assessment and treatment plan with the patient. The patient was provided an opportunity to ask questions and all were answered. The patient agreed with the plan and demonstrated an understanding of the instructions.  Patient advised to call back or seek an in-person evaluation if the symptoms or condition worsens. I personally spent a total of 20 minutes in the care of the patient today including preparing to see the patient, getting/reviewing separately obtained history, performing a medically appropriate exam/evaluation, counseling and educating, placing orders, and documenting clinical information in the EHR.   Note by: Nurse Marcelino, MD (TTS and AI technology used. I apologize for any typographical errors that were not detected and corrected.) Date: 07/19/2024; Time: 1:36 PM

## 2024-07-19 NOTE — Patient Instructions (Signed)

## 2024-07-29 ENCOUNTER — Other Ambulatory Visit: Payer: Self-pay | Admitting: Internal Medicine

## 2024-07-29 DIAGNOSIS — E782 Mixed hyperlipidemia: Secondary | ICD-10-CM

## 2024-08-02 NOTE — Telephone Encounter (Signed)
 Requested Prescriptions  Pending Prescriptions Disp Refills   rosuvastatin  (CRESTOR ) 40 MG tablet [Pharmacy Med Name: ROSUVASTATIN  40MG  TABLETS] 90 tablet 1    Sig: TAKE 1 TABLET(40 MG) BY MOUTH DAILY     Cardiovascular:  Antilipid - Statins 2 Failed - 08/02/2024  1:39 PM      Failed - Cr in normal range and within 360 days    Creatinine, Ser  Date Value Ref Range Status  06/08/2024 1.03 (H) 0.57 - 1.00 mg/dL Final         Failed - Lipid Panel in normal range within the last 12 months    Cholesterol, Total  Date Value Ref Range Status  06/08/2024 150 100 - 199 mg/dL Final   LDL Chol Calc (NIH)  Date Value Ref Range Status  06/08/2024 66 0 - 99 mg/dL Final   HDL  Date Value Ref Range Status  06/08/2024 68 >39 mg/dL Final   Triglycerides  Date Value Ref Range Status  06/08/2024 83 0 - 149 mg/dL Final         Passed - Patient is not pregnant      Passed - Valid encounter within last 12 months    Recent Outpatient Visits           1 month ago Annual physical exam   [facility], Leita DEL, MD   2 months ago Cervical spondylosis with radiculopathy   [facility], Selinda PARAS, MD   4 months ago Rotator cuff impingement syndrome of right shoulder   [facility], Selinda PARAS, MD   4 months ago Tendonitis of shoulder, right   [facility], Leita DEL, MD   6 months ago Essential hypertension   [facility], Leita DEL, MD

## 2024-08-03 DIAGNOSIS — G479 Sleep disorder, unspecified: Secondary | ICD-10-CM | POA: Diagnosis not present

## 2024-08-03 DIAGNOSIS — Z59869 Financial insecurity, unspecified: Secondary | ICD-10-CM | POA: Diagnosis not present

## 2024-08-03 DIAGNOSIS — G250 Essential tremor: Secondary | ICD-10-CM | POA: Diagnosis not present

## 2024-08-03 DIAGNOSIS — Z8673 Personal history of transient ischemic attack (TIA), and cerebral infarction without residual deficits: Secondary | ICD-10-CM | POA: Diagnosis not present

## 2024-08-03 DIAGNOSIS — F329 Major depressive disorder, single episode, unspecified: Secondary | ICD-10-CM | POA: Diagnosis not present

## 2024-08-30 ENCOUNTER — Ambulatory Visit
Admission: EM | Admit: 2024-08-30 | Discharge: 2024-08-30 | Disposition: A | Discharge status: Home / Self Care | Attending: Physician Assistant | Admitting: Physician Assistant

## 2024-08-30 ENCOUNTER — Ambulatory Visit: Payer: Self-pay | Admitting: *Deleted

## 2024-08-30 DIAGNOSIS — R5383 Other fatigue: Secondary | ICD-10-CM | POA: Insufficient documentation

## 2024-08-30 DIAGNOSIS — R051 Acute cough: Secondary | ICD-10-CM | POA: Insufficient documentation

## 2024-08-30 DIAGNOSIS — N390 Urinary tract infection, site not specified: Secondary | ICD-10-CM | POA: Insufficient documentation

## 2024-08-30 DIAGNOSIS — J449 Chronic obstructive pulmonary disease, unspecified: Secondary | ICD-10-CM | POA: Diagnosis not present

## 2024-08-30 DIAGNOSIS — J069 Acute upper respiratory infection, unspecified: Secondary | ICD-10-CM | POA: Diagnosis not present

## 2024-08-30 DIAGNOSIS — N3000 Acute cystitis without hematuria: Secondary | ICD-10-CM | POA: Insufficient documentation

## 2024-08-30 LAB — POCT URINE DIPSTICK
Bilirubin, UA: NEGATIVE
Blood, UA: NEGATIVE
Glucose, UA: NEGATIVE mg/dL
Ketones, POC UA: NEGATIVE mg/dL
Nitrite, UA: POSITIVE — AB
Protein Ur, POC: NEGATIVE mg/dL
Spec Grav, UA: 1.015
Urobilinogen, UA: 0.2 U/dL
pH, UA: 6.5

## 2024-08-30 LAB — POC SOFIA SARS ANTIGEN FIA: SARS Coronavirus 2 Ag: NEGATIVE

## 2024-08-30 MED ORDER — CEFDINIR 300 MG PO CAPS: 300.0000 mg | ORAL_CAPSULE | Freq: Two times a day (BID) | ORAL | 0 refills | Status: AC

## 2024-08-30 MED ORDER — PROMETHAZINE-DM 6.25-15 MG/5ML PO SYRP: 5.0000 mL | ORAL_SOLUTION | Freq: Four times a day (QID) | ORAL | 0 refills | Status: AC | PRN

## 2024-08-30 NOTE — ED Provider Notes (Signed)
 " [facility]    CSN: [id number] Arrival date & time: 08/30/24  1204      History   Chief Complaint Chief Complaint  Patient presents with   Fever   Chills   Cough   Dysuria    HPI Tiffany Bonilla is a 68 y.o. female with history of asthma, COPD, CKD stage 3, allergies, prediabetes, hypertension, previous stroke, SVT and spina bifida occulta presents for 3-day history of low-grade fever, fatigue, chills, cough, congestion.  Denies sore throat, ear pain, sinus pain, chest pain, wheezing, shortness of breath, abdominal pain.  Patient also reports dysuria, frequency and urgency and thinks she may have a UTI.  History of numerous UTIs a year.  She says she is normally treated with Macrobid  but does not think it works that well for her.  Has been taking OTC meds.  No other complaints.  HPI  Past Medical History:  Diagnosis Date   Allergy    Asthma    COVID 07/07/2022   Hyperlipidemia    Hypertension    Pre-diabetes 11/24/2018   Spina bifida occulta    Stroke [facility])    SVT (supraventricular tachycardia) 11/24/2018   S/p ablation at [facility]   Wears dentures    full upper    Patient Active Problem List   Diagnosis Date Noted   Cervical spondylosis with radiculopathy 05/05/2024   Rotator cuff impingement syndrome of right shoulder 03/16/2024   Cervical paraspinal muscle spasm 03/16/2024   Subacute cough 08/18/2023   Chronic ischemic left middle cerebral artery (MCA) stroke 09/22/2022   Carotid artery thrombosis, left 09/13/2022   Centrilobular emphysema (HCC) 04/24/2022   Polyp of descending colon    Chronic midline low back pain without sciatica 06/19/2021   Chronic kidney disease, stage 3b (HCC) 04/19/2021   Atrophic kidney 09/27/2020   Aortic aneurysm, abdominal 09/27/2020   Benign essential tremor 04/09/2020   Vitamin D  deficiency 09/06/2019   Generalized anxiety disorder 02/16/2019   Lichen sclerosus et atrophicus of the vulva 11/25/2018    Essential hypertension 11/24/2018   Old lacunar stroke without late effect 11/24/2018   Tobacco abuse, in remission 10/25/2015   Hx of hepatitis C 10/25/2015   CAD in native artery 05/24/2015   Mixed hyperlipidemia 06/12/2011    Past Surgical History:  Procedure Laterality Date   CARDIAC ELECTROPHYSIOLOGY STUDY AND ABLATION  2006   for SVT   CATARACT EXTRACTION W/PHACO Right 02/06/2021   Procedure: CATARACT EXTRACTION PHACO AND INTRAOCULAR LENS PLACEMENT (IOC) RIGHT;  Surgeon: Mittie Gaskin, MD;  Location: [facility];  Service: Ophthalmology;  Laterality: Right;  2.85 0:32.6 8.7%   CATARACT EXTRACTION W/PHACO Left 02/20/2021   Procedure: CATARACT EXTRACTION PHACO AND INTRAOCULAR LENS PLACEMENT (IOC) LEFT;  Surgeon: Mittie Gaskin, MD;  Location: [facility];  Service: Ophthalmology;  Laterality: Left;  3.12 0:39.5   COLONOSCOPY  2006   COLONOSCOPY WITH PROPOFOL  N/A 10/25/2021   Procedure: COLONOSCOPY WITH BIOPSY;  Surgeon: Jinny Carmine, MD;  Location: [facility];  Service: Endoscopy;  Laterality: N/A;   CORONARY ANGIOPLASTY WITH STENT PLACEMENT  2001   to Circumfx   ESOPHAGOGASTRODUODENOSCOPY  2017   LAPAROSCOPIC CHOLECYSTECTOMY  04/25/2019   @ [facility]   LAPAROSCOPIC GASTRIC SLEEVE RESECTION  11/07/2015   POLYPECTOMY N/A 10/25/2021   Procedure: POLYPECTOMY;  Surgeon: Jinny Carmine, MD;  Location: [facility];  Service: Endoscopy;  Laterality: N/A;   TOTAL ABDOMINAL HYSTERECTOMY  1992   CIS cervix    OB History  No obstetric history on file.      Home Medications    Prior to Admission medications  Medication Sig Start Date End Date Taking? Authorizing Provider  cefdinir (OMNICEF) 300 MG capsule Take 1 capsule (300 mg total) by mouth 2 (two) times daily for 7 days. 08/30/24 09/06/24 Yes Arvis Huxley B, PA-C  promethazine -dextromethorphan (PROMETHAZINE -DM) 6.25-15 MG/5ML syrup Take 5 mLs by mouth 4 (four) times daily as needed. 08/30/24   Yes Arvis Huxley NOVAK, PA-C  amLODipine  (NORVASC ) 5 MG tablet Take 1 tablet (5 mg total) by mouth daily. 06/08/24   Justus Leita DEL, MD  aspirin EC 81 MG tablet Take 1 tablet by mouth daily.    [provider]  cholecalciferol (VITAMIN D3) 25 MCG (1000 UNIT) tablet Take 2,000 Units by mouth daily.    [provider]  clobetasol  cream (TEMOVATE ) 0.05 % Apply topically 2 (two) times daily. 02/18/24   Justus Leita DEL, MD  cyclobenzaprine  (FLEXERIL ) 10 MG tablet Take 1 tablet (10 mg total) by mouth at bedtime. 06/08/24   Justus Leita DEL, MD  escitalopram  (LEXAPRO ) 20 MG tablet TAKE 1 TABLET(20 MG) BY MOUTH DAILY 06/08/24   Justus Leita DEL, MD  ipratropium-albuterol  (DUONEB) 0.5-2.5 (3) MG/3ML SOLN Take 3 mLs by nebulization every 4 (four) hours as needed. 08/18/23   Alvia Selinda PARAS, MD  lisinopril  (ZESTRIL ) 20 MG tablet Take 1 tablet (20 mg total) by mouth daily. 06/08/24   Justus Leita DEL, MD  Melatonin 10 MG TABS Take 1 tablet by mouth at bedtime.    [provider]  Multiple Vitamins-Minerals (ALIVE WOMENS ENERGY) TABS Take by mouth.    [provider]  propranolol (INDERAL) 20 MG tablet Take 20 mg by mouth 2 (two) times daily. 04/13/24   [provider]  rosuvastatin  (CRESTOR ) 40 MG tablet TAKE 1 TABLET(40 MG) BY MOUTH DAILY 08/02/24   Justus Leita DEL, MD  Vitamin D , Ergocalciferol , (DRISDOL ) 1.25 MG (50000 UNIT) CAPS capsule TAKE 1 CAPSULE BY MOUTH EVERY 7 DAYS 06/08/23   Justus Leita DEL, MD    Family History Family History  Problem Relation Age of Onset   Aneurysm Mother    Breast cancer Maternal Aunt     Social History Social History[1]   Allergies   Morphine, Scopolamine, Codeine , and Sulfa antibiotics   Review of Systems Review of Systems  Constitutional:  Positive for fatigue and fever (up to 100 degrees). Negative for chills and diaphoresis.  HENT:  Positive for congestion and rhinorrhea. Negative for ear pain, sinus  pressure, sinus pain and sore throat.   Respiratory:  Positive for cough. Negative for shortness of breath.   Cardiovascular:  Negative for chest pain.  Gastrointestinal:  Negative for abdominal pain, diarrhea, nausea and vomiting.  Genitourinary:  Positive for dysuria, frequency and urgency. Negative for decreased urine volume, flank pain, hematuria, pelvic pain, vaginal bleeding, vaginal discharge and vaginal pain.  Musculoskeletal:  Negative for arthralgias, back pain and myalgias.  Skin:  Negative for rash.  Neurological:  Negative for weakness and headaches.  Hematological:  Negative for adenopathy.     Physical Exam Triage Vital Signs ED Triage Vitals  Encounter Vitals Group     BP      Girls Systolic BP Percentile      Girls Diastolic BP Percentile      Boys Systolic BP Percentile      Boys Diastolic BP Percentile      Pulse      Resp  Temp      Temp src      SpO2      Weight      Height      Head Circumference      Peak Flow      Pain Score      Pain Loc      Pain Education      Exclude from Growth Chart    No data found.  Updated Vital Signs BP (!) 166/89 (BP Location: Left Arm)   Pulse (!) 58   Temp 97.9 F (36.6 C) (Oral)   Resp 16   Wt 154 lb 1.6 oz (69.9 kg)   SpO2 96%   BMI 29.12 kg/m     Physical Exam Vitals and nursing note reviewed.  Constitutional:      General: She is not in acute distress.    Appearance: Normal appearance. She is not ill-appearing or toxic-appearing.  HENT:     Head: Normocephalic and atraumatic.     Nose: Congestion present.     Mouth/Throat:     Mouth: Mucous membranes are moist.     Pharynx: Oropharynx is clear.  Eyes:     General: No scleral icterus.       Right eye: No discharge.        Left eye: No discharge.     Conjunctiva/sclera: Conjunctivae normal.  Cardiovascular:     Rate and Rhythm: Normal rate and regular rhythm.     Heart sounds: Normal heart sounds.  Pulmonary:     Effort: Pulmonary effort  is normal. No respiratory distress.     Breath sounds: Normal breath sounds.  Abdominal:     Palpations: Abdomen is soft.     Tenderness: There is no abdominal tenderness. There is no right CVA tenderness or left CVA tenderness.  Musculoskeletal:     Cervical back: Neck supple.  Skin:    General: Skin is dry.  Neurological:     General: No focal deficit present.     Mental Status: She is alert. Mental status is at baseline.     Motor: No weakness.     Gait: Gait normal.  Psychiatric:        Mood and Affect: Mood normal.        Behavior: Behavior normal.      UC Treatments / Results  Labs (all labs ordered are listed, but only abnormal results are displayed) Labs Reviewed  POCT URINE DIPSTICK - Abnormal; Notable for the following components:      Result Value   Clarity, UA cloudy (*)    Nitrite, UA Positive (*)    Leukocytes, UA Small (1+) (*)    All other components within normal limits  POC SOFIA SARS ANTIGEN FIA - Normal  URINE CULTURE    EKG   Radiology No results found.  Procedures Procedures (including critical care time)  Medications Ordered in UC Medications - No data to display  Initial Impression / Assessment and Plan / UC Course  I have reviewed the triage vital signs and the nursing notes.  Pertinent labs & imaging results that were available during my care of the patient were reviewed by me and considered in my medical decision making (see chart for details).  68 year old female with asthma/COPD presents for 3-day history of low-grade fever, fatigue, cough, congestion, fatigue, dysuria, frequency and urgency.  She denies productive cough, chest pain or shortness of breath.  Vitals are stable and normal.  Patient overall well-appearing.  No acute chest pain on exam has nasal congestion.  Throat clear.  Chest clear.  Heart regular rate and rhythm.  Abdomen soft and nontender.  No CVA tenderness.  COVID test and urinalysis obtained.  Negative COVID  test.  Urinalysis shows cloudy urine with positive nitrites and small leukocytes.  Will send for culture.  Viral illness.  Supportive care encouraged with increased rest and fluids.  Temperature with standing up to pharmacy.  Encouraged her to use inhaler at home as needed for rescue but if she feels short of breath and cough becomes productive she should be reevaluated for possible COPD exacerbation.  Treating UTI with cefdinir.  Encouraged increasing fluids.  Will amend treatment based on culture if needed.   Final Clinical Impressions(s) / UC Diagnoses   Final diagnoses:  Other fatigue  Acute cystitis without hematuria  Viral upper respiratory tract infection  Acute cough  Recurrent UTI  Chronic obstructive pulmonary disease, unspecified COPD type [facility])     Discharge Instructions      UTI: Based on either symptoms or urinalysis, you may have a urinary tract infection. We will send the urine for culture and call with results in a few days. Begin antibiotics at this time. Your symptoms should be much improved over the next 2-3 days. Increase rest and fluid intake. If for some reason symptoms are worsening or not improving after a couple of days or the urine culture determines the antibiotics you are taking will not treat the infection, the antibiotics may be changed. Return or go to ER for fever, back pain, worsening urinary pain, discharge, increased blood in urine. May take Tylenol  or Motrin OTC for pain relief or consider AZO if no contraindications   URI/COLD SYMPTOMS: Your exam today is consistent with a viral illness. Antibiotics are not indicated at this time. Use medications as directed, including cough syrup, nasal saline, and decongestants. Your symptoms should improve over the next few days and resolve within 7-10 days. Increase rest and fluids. F/u if symptoms worsen or predominate such as sore throat, ear pain, productive cough, shortness of breath, or if you develop high  fevers or worsening fatigue over the next several days.       ED Prescriptions     Medication Sig Dispense Auth. Provider   cefdinir (OMNICEF) 300 MG capsule Take 1 capsule (300 mg total) by mouth 2 (two) times daily for 7 days. 14 capsule Arvis Huxley B, PA-C   promethazine -dextromethorphan (PROMETHAZINE -DM) 6.25-15 MG/5ML syrup Take 5 mLs by mouth 4 (four) times daily as needed. 118 mL Arvis Huxley NOVAK, PA-C      PDMP not reviewed this encounter.     [1]  Social History Tobacco Use   Smoking status: Former    Current packs/day: 0.00    Average packs/day: 0.5 packs/day for 44.0 years (22.0 ttl pk-yrs)    Types: Cigarettes    Start date: 70    Quit date: 09/13/2022    Years since quitting: 1.9   Smokeless tobacco: Never  Vaping Use   Vaping status: Never Used  Substance Use Topics   Alcohol use: Not Currently    Comment: rare   Drug use: Never     Arvis Huxley NOVAK, PA-C 08/30/24 1534  "

## 2024-08-30 NOTE — ED Triage Notes (Signed)
 Pt c/o fever,chills,cough & fatigue x3 days. Tmax 100 last night. Has tried OTC meds w/o relief.  Also c/o urinary freq,burning & pain x4 days. Denies any hematuria

## 2024-08-30 NOTE — Telephone Encounter (Signed)
 FYI Only or Action Required?: FYI only for provider: recommended UC .  Patient was last seen in primary care on 06/08/2024 by Justus Leita DEL, MD.  Called Nurse Triage reporting Abdominal Pain.  Symptoms began several days ago.  Interventions attempted: Rest, hydration, or home remedies.  Symptoms are: rapidly worsening.  Triage Disposition: See HCP Within 4 Hours (Or PCP Triage)  Patient/caregiver understands and will follow disposition?: Yes   Recommended UC no available appt today . Hx UTIs see NT encounter                 Copied from CRM #[id number]. Topic: Clinical - Red Word Triage >> Aug 30, 2024  8:50 AM Tiffany Bonilla wrote: Red Word that prompted transfer to Nurse Triage: Lower abdomen, lower back pain, and fatigue beginning yesterday. Reason for Disposition  Side (flank) or lower back pain present  Answer Assessment - Initial Assessment Questions Recommended UC no available appt until Jan 2.          1. SEVERITY: How bad is the pain?  (e.g., Scale 1-10; mild, moderate, or severe)     severe 2. FREQUENCY: How many times have you had painful urination today?      Pressure with urination  3. PATTERN: Is pain present every time you urinate or just sometimes?      Na  4. ONSET: When did the painful urination start?      3 days 5. FEVER: Do you have a fever? If Yes, ask: What is your temperature, how was it measured, and when did it start?     no 6. PAST UTI: Have you had a urine infection before? If Yes, ask: When was the last time? and What happened that time?      Yes E coli 7. CAUSE: What do you think is causing the painful urination?  (e.g., UTI, scratch, Herpes sore)     UTI 8. OTHER SYMPTOMS: Do you have any other symptoms? (e.g., blood in urine, flank pain, genital sores, urgency, vaginal discharge)     Low abdominal pain pressure with urination , back pain odor with urine, cloudy urine  9. PREGNANCY: Is there any chance  you are pregnant? When was your last menstrual period?     na  Protocols used: Urination Pain - Female-A-AH

## 2024-08-30 NOTE — Discharge Instructions (Addendum)
UTI: Based on either symptoms or urinalysis, you may have a urinary tract infection. We will send the urine for culture and call with results in a few days. Begin antibiotics at this time. Your symptoms should be much improved over the next 2-3 days. Increase rest and fluid intake. If for some reason symptoms are worsening or not improving after a couple of days or the urine culture determines the antibiotics you are taking will not treat the infection, the antibiotics may be changed. Return or go to ER for fever, back pain, worsening urinary pain, discharge, increased blood in urine. May take Tylenol or Motrin OTC for pain relief or consider AZO if no contraindications   URI/COLD SYMPTOMS: Your exam today is consistent with a viral illness. Antibiotics are not indicated at this time. Use medications as directed, including cough syrup, nasal saline, and decongestants. Your symptoms should improve over the next few days and resolve within 7-10 days. Increase rest and fluids. F/u if symptoms worsen or predominate such as sore throat, ear pain, productive cough, shortness of breath, or if you develop high fevers or worsening fatigue over the next several days.   

## 2024-09-02 ENCOUNTER — Ambulatory Visit (HOSPITAL_COMMUNITY): Payer: Self-pay

## 2024-09-02 LAB — URINE CULTURE: Culture: >=100000 — AB

## 2024-09-12 ENCOUNTER — Other Ambulatory Visit: Payer: Self-pay

## 2024-09-12 DIAGNOSIS — E559 Vitamin D deficiency, unspecified: Secondary | ICD-10-CM

## 2024-09-12 MED ORDER — VITAMIN D (ERGOCALCIFEROL) 1.25 MG (50000 UNIT) PO CAPS: 50000.0000 [IU] | ORAL_CAPSULE | ORAL | 0 refills | Status: AC

## 2024-12-07 ENCOUNTER — Encounter: Admitting: Student
